# Patient Record
Sex: Female | Born: 1985 | Hispanic: Yes | Marital: Single | Smoking: Current some day smoker
Health system: Southern US, Community
[De-identification: ages and names within clinical notes are randomized; demographics above are authoritative.]

## PROBLEM LIST (undated history)

## (undated) DIAGNOSIS — F329 Major depressive disorder, single episode, unspecified: Secondary | ICD-10-CM

## (undated) DIAGNOSIS — D270 Benign neoplasm of right ovary: Secondary | ICD-10-CM

## (undated) DIAGNOSIS — F319 Bipolar disorder, unspecified: Secondary | ICD-10-CM

## (undated) DIAGNOSIS — F32A Depression, unspecified: Secondary | ICD-10-CM

## (undated) DIAGNOSIS — T7840XA Allergy, unspecified, initial encounter: Secondary | ICD-10-CM

## (undated) DIAGNOSIS — J45909 Unspecified asthma, uncomplicated: Secondary | ICD-10-CM

## (undated) DIAGNOSIS — K8020 Calculus of gallbladder without cholecystitis without obstruction: Secondary | ICD-10-CM

## (undated) DIAGNOSIS — F1910 Other psychoactive substance abuse, uncomplicated: Secondary | ICD-10-CM

## (undated) DIAGNOSIS — F419 Anxiety disorder, unspecified: Secondary | ICD-10-CM

## (undated) DIAGNOSIS — B009 Herpesviral infection, unspecified: Secondary | ICD-10-CM

## (undated) HISTORY — PX: TUBAL LIGATION: SHX77

## (undated) HISTORY — PX: OTHER SURGICAL HISTORY: SHX169

## (undated) HISTORY — DX: Other psychoactive substance abuse, uncomplicated: F19.10

## (undated) HISTORY — DX: Herpesviral infection, unspecified: B00.9

## (undated) HISTORY — DX: Allergy, unspecified, initial encounter: T78.40XA

## (undated) HISTORY — PX: DILATION AND CURETTAGE OF UTERUS: SHX78

## (undated) HISTORY — DX: Benign neoplasm of right ovary: D27.0

## (undated) HISTORY — DX: Calculus of gallbladder without cholecystitis without obstruction: K80.20

## (undated) HISTORY — DX: Unspecified asthma, uncomplicated: J45.909

## (undated) HISTORY — PX: MULTIPLE TOOTH EXTRACTIONS: SHX2053

---
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
## 2005-09-21 ENCOUNTER — Emergency Department (HOSPITAL_COMMUNITY): Admission: EM | Admit: 2005-09-21 | Discharge: 2005-09-21 | Payer: Self-pay | Admitting: Emergency Medicine

## 2006-12-14 ENCOUNTER — Emergency Department (HOSPITAL_COMMUNITY): Admission: EM | Admit: 2006-12-14 | Discharge: 2006-12-14 | Payer: Self-pay | Admitting: Family Medicine

## 2007-02-07 ENCOUNTER — Ambulatory Visit (HOSPITAL_COMMUNITY): Admission: RE | Admit: 2007-02-07 | Discharge: 2007-02-07 | Payer: Self-pay | Admitting: Obstetrics & Gynecology

## 2007-02-26 ENCOUNTER — Emergency Department (HOSPITAL_COMMUNITY): Admission: EM | Admit: 2007-02-26 | Discharge: 2007-02-26 | Payer: Self-pay | Admitting: Emergency Medicine

## 2007-03-07 ENCOUNTER — Ambulatory Visit (HOSPITAL_COMMUNITY): Admission: RE | Admit: 2007-03-07 | Discharge: 2007-03-07 | Payer: Self-pay | Admitting: Obstetrics & Gynecology

## 2012-10-22 ENCOUNTER — Emergency Department (HOSPITAL_COMMUNITY)
Admission: EM | Admit: 2012-10-22 | Discharge: 2012-10-23 | Disposition: A | Discharge status: Home / Self Care | Payer: Medicaid Other | Attending: Emergency Medicine | Admitting: Emergency Medicine

## 2012-10-22 ENCOUNTER — Encounter (HOSPITAL_COMMUNITY): Payer: Self-pay

## 2012-10-22 DIAGNOSIS — Z3202 Encounter for pregnancy test, result negative: Secondary | ICD-10-CM | POA: Insufficient documentation

## 2012-10-22 DIAGNOSIS — R45851 Suicidal ideations: Secondary | ICD-10-CM

## 2012-10-22 DIAGNOSIS — F39 Unspecified mood [affective] disorder: Secondary | ICD-10-CM | POA: Insufficient documentation

## 2012-10-22 DIAGNOSIS — IMO0002 Reserved for concepts with insufficient information to code with codable children: Secondary | ICD-10-CM | POA: Diagnosis present

## 2012-10-22 DIAGNOSIS — X789XXA Intentional self-harm by unspecified sharp object, initial encounter: Secondary | ICD-10-CM | POA: Insufficient documentation

## 2012-10-22 DIAGNOSIS — Z8719 Personal history of other diseases of the digestive system: Secondary | ICD-10-CM | POA: Insufficient documentation

## 2012-10-22 DIAGNOSIS — F439 Reaction to severe stress, unspecified: Secondary | ICD-10-CM | POA: Diagnosis present

## 2012-10-22 DIAGNOSIS — F17200 Nicotine dependence, unspecified, uncomplicated: Secondary | ICD-10-CM | POA: Insufficient documentation

## 2012-10-22 HISTORY — DX: Major depressive disorder, single episode, unspecified: F32.9

## 2012-10-22 HISTORY — DX: Depression, unspecified: F32.A

## 2012-10-22 HISTORY — DX: Anxiety disorder, unspecified: F41.9

## 2012-10-22 LAB — CBC
HCT: 37.6 % (ref 36.0–46.0)
Hemoglobin: 12.4 g/dL (ref 12.0–15.0)
MCH: 30.1 pg (ref 26.0–34.0)
MCHC: 33 g/dL (ref 30.0–36.0)
MCV: 91.3 fL (ref 78.0–100.0)
Platelets: 336 10*3/uL (ref 150–400)
RBC: 4.12 MIL/uL (ref 3.87–5.11)
RDW: 13.2 % (ref 11.5–15.5)
WBC: 6.3 10*3/uL (ref 4.0–10.5)

## 2012-10-22 LAB — RAPID URINE DRUG SCREEN, HOSP PERFORMED
Amphetamines: NOT DETECTED
Barbiturates: NOT DETECTED
Benzodiazepines: NOT DETECTED
Cocaine: POSITIVE — AB
Opiates: NOT DETECTED
Tetrahydrocannabinol: POSITIVE — AB

## 2012-10-22 LAB — COMPREHENSIVE METABOLIC PANEL
ALT: 10 U/L (ref 0–35)
AST: 14 U/L (ref 0–37)
Albumin: 3.6 g/dL (ref 3.5–5.2)
Alkaline Phosphatase: 79 U/L (ref 39–117)
BUN: 13 mg/dL (ref 6–23)
CO2: 25 mEq/L (ref 19–32)
Calcium: 9.5 mg/dL (ref 8.4–10.5)
Chloride: 105 mEq/L (ref 96–112)
Creatinine, Ser: 0.59 mg/dL (ref 0.50–1.10)
GFR calc Af Amer: >90 mL/min (ref >90)
GFR calc non Af Amer: >90 mL/min (ref >90)
Glucose, Bld: 84 mg/dL (ref 70–99)
Potassium: 3.7 mEq/L (ref 3.5–5.1)
Sodium: 137 mEq/L (ref 135–145)
Total Bilirubin: 0.2 mg/dL — ABNORMAL LOW (ref 0.3–1.2)
Total Protein: 7.7 g/dL (ref 6.0–8.3)

## 2012-10-22 LAB — ACETAMINOPHEN LEVEL: Acetaminophen (Tylenol), Serum: <15 ug/mL (ref 10–30)

## 2012-10-22 LAB — ETHANOL: Alcohol, Ethyl (B): <11 mg/dL (ref 0–11)

## 2012-10-22 LAB — POCT PREGNANCY, URINE: Preg Test, Ur: NEGATIVE

## 2012-10-22 LAB — SALICYLATE LEVEL: Salicylate Lvl: <2 mg/dL — ABNORMAL LOW (ref 2.8–20.0)

## 2012-10-22 MED ORDER — ACETAMINOPHEN 325 MG PO TABS
650.0000 mg | ORAL_TABLET | ORAL | Status: DC | PRN
Start: 2012-10-22 — End: 2012-10-23

## 2012-10-22 MED ORDER — ALUM & MAG HYDROXIDE-SIMETH 200-200-20 MG/5ML PO SUSP: 30.0000 mL | ORAL | Status: DC | PRN

## 2012-10-22 MED ORDER — ZOLPIDEM TARTRATE 5 MG PO TABS: 5.0000 mg | ORAL_TABLET | Freq: Every evening | ORAL | Status: DC | PRN

## 2012-10-22 MED ORDER — IBUPROFEN 600 MG PO TABS: 600.0000 mg | ORAL_TABLET | Freq: Three times a day (TID) | ORAL | Status: DC | PRN

## 2012-10-22 MED ORDER — ONDANSETRON HCL 4 MG PO TABS
4.0000 mg | ORAL_TABLET | Freq: Three times a day (TID) | ORAL | Status: DC | PRN
Start: 2012-10-22 — End: 2012-10-23

## 2012-10-22 MED ORDER — NICOTINE 21 MG/24HR TD PT24: 21.0000 mg | MEDICATED_PATCH | Freq: Every day | TRANSDERMAL | Status: DC

## 2012-10-22 MED ORDER — LORAZEPAM 1 MG PO TABS: 1.0000 mg | ORAL_TABLET | Freq: Three times a day (TID) | ORAL | Status: DC | PRN
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
## 2012-10-22 NOTE — ED Notes (Signed)
Consent form and sign for patient friend to get medical information.

## 2012-10-23 ENCOUNTER — Encounter (HOSPITAL_COMMUNITY): Payer: Self-pay | Admitting: *Deleted

## 2012-10-23 DIAGNOSIS — IMO0002 Reserved for concepts with insufficient information to code with codable children: Secondary | ICD-10-CM | POA: Diagnosis present

## 2012-10-23 DIAGNOSIS — T7431XA Adult psychological abuse, confirmed, initial encounter: Secondary | ICD-10-CM
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
## 2012-10-23 NOTE — ED Notes (Signed)
D/C instructions and resource information given. Ambulatory without difficulty. No complaints voiced. One book bag returned. Escorted by mental health tech to front of hospital.
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
## 2012-10-23 NOTE — Progress Notes (Signed)
Pt confirms pcp is edwin avbuere EPIC updated

## 2012-10-28 NOTE — ED Provider Notes (Signed)
Medical screening examination/treatment/procedure(s) were performed by non-physician practitioner and as supervising physician I was immediately available for consultation/collaboration.  Derwood Kaplan, MD 10/28/12 [phone]

## 2013-02-05 ENCOUNTER — Emergency Department (HOSPITAL_COMMUNITY)
Admission: EM | Admit: 2013-02-05 | Discharge: 2013-02-06 | Disposition: A | Discharge status: Home / Self Care | Payer: Medicaid Other | Attending: Emergency Medicine | Admitting: Emergency Medicine

## 2013-02-05 ENCOUNTER — Encounter (HOSPITAL_COMMUNITY): Payer: Self-pay | Admitting: Emergency Medicine

## 2013-02-05 DIAGNOSIS — T7491XA Unspecified adult maltreatment, confirmed, initial encounter: Secondary | ICD-10-CM | POA: Insufficient documentation

## 2013-02-05 DIAGNOSIS — IMO0002 Reserved for concepts with insufficient information to code with codable children: Secondary | ICD-10-CM

## 2013-02-05 DIAGNOSIS — F17200 Nicotine dependence, unspecified, uncomplicated: Secondary | ICD-10-CM | POA: Insufficient documentation

## 2013-02-05 DIAGNOSIS — Z8659 Personal history of other mental and behavioral disorders: Secondary | ICD-10-CM | POA: Insufficient documentation

## 2013-02-05 LAB — CBC
HCT: 38.9 % (ref 36.0–46.0)
Hemoglobin: 13.2 g/dL (ref 12.0–15.0)
MCH: 31.4 pg (ref 26.0–34.0)
MCHC: 33.9 g/dL (ref 30.0–36.0)
MCV: 92.4 fL (ref 78.0–100.0)
Platelets: 416 10*3/uL — ABNORMAL HIGH (ref 150–400)
RBC: 4.21 MIL/uL (ref 3.87–5.11)
RDW: 12.6 % (ref 11.5–15.5)
WBC: 7.1 10*3/uL (ref 4.0–10.5)

## 2013-02-05 LAB — COMPREHENSIVE METABOLIC PANEL
ALT: 13 U/L (ref 0–35)
AST: 19 U/L (ref 0–37)
Albumin: 3.6 g/dL (ref 3.5–5.2)
Alkaline Phosphatase: 83 U/L (ref 39–117)
BUN: 13 mg/dL (ref 6–23)
CO2: 24 mEq/L (ref 19–32)
Calcium: 9.3 mg/dL (ref 8.4–10.5)
Chloride: 103 mEq/L (ref 96–112)
Creatinine, Ser: 0.76 mg/dL (ref 0.50–1.10)
GFR calc Af Amer: >90 mL/min (ref >90)
GFR calc non Af Amer: >90 mL/min (ref >90)
Glucose, Bld: 95 mg/dL (ref 70–99)
Potassium: 4 mEq/L (ref 3.5–5.1)
Sodium: 137 mEq/L (ref 135–145)
Total Bilirubin: 0.2 mg/dL — ABNORMAL LOW (ref 0.3–1.2)
Total Protein: 7.4 g/dL (ref 6.0–8.3)

## 2013-02-05 LAB — SALICYLATE LEVEL: Salicylate Lvl: <2 mg/dL — ABNORMAL LOW (ref 2.8–20.0)

## 2013-02-05 LAB — ETHANOL: Alcohol, Ethyl (B): <11 mg/dL (ref 0–11)

## 2013-02-05 LAB — ACETAMINOPHEN LEVEL: Acetaminophen (Tylenol), Serum: <15 ug/mL (ref 10–30)
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
## 2013-02-06 NOTE — ED Provider Notes (Signed)
  Medical screening examination/treatment/procedure(s) were performed by non-physician practitioner and as supervising physician I was immediately available for consultation/collaboration.    Gerhard Munch, MD 02/06/13 [phone]

## 2013-03-28 ENCOUNTER — Emergency Department (HOSPITAL_COMMUNITY)
Admission: EM | Admit: 2013-03-28 | Discharge: 2013-03-28 | Disposition: A | Discharge status: Home / Self Care | Payer: Medicaid Other | Source: Home / Self Care | Attending: Emergency Medicine | Admitting: Emergency Medicine

## 2013-03-28 ENCOUNTER — Encounter (HOSPITAL_COMMUNITY): Payer: Self-pay | Admitting: Emergency Medicine

## 2013-03-28 DIAGNOSIS — K047 Periapical abscess without sinus: Secondary | ICD-10-CM

## 2013-03-28 MED ORDER — AMOXICILLIN-POT CLAVULANATE 875-125 MG PO TABS: 1.0000 | ORAL_TABLET | Freq: Two times a day (BID) | ORAL | Status: DC

## 2013-03-28 MED ORDER — IBUPROFEN 800 MG PO TABS: 800.0000 mg | ORAL_TABLET | Freq: Three times a day (TID) | ORAL | Status: DC | PRN

## 2013-03-28 NOTE — ED Provider Notes (Signed)
Medical screening examination/treatment/procedure(s) were performed by non-physician practitioner and as supervising physician I was immediately available for consultation/collaboration.  Leslee Home, M.D.  Reuben Likes, MD 03/28/13 2033

## 2013-03-28 NOTE — ED Notes (Signed)
Dental pain and swelling onset Wednesday.  Visible swelling and redness to right side of face.  Patient reports several issues with teeth on upper right, does have a broken tooth

## 2013-03-28 NOTE — ED Provider Notes (Signed)
CSN: [id number]     Arrival date & time 03/28/13  1449 History   First MD Initiated Contact with Patient 03/28/13 1600     Chief Complaint  Patient presents with  . Dental Pain   (Consider location/radiation/quality/duration/timing/severity/associated sxs/prior Treatment) HPI Comments: 27 year old female presents complaining of dental infection and facial swelling. She has painful teeth, swelling, tenderness, and redness of her posterior right upper jaw. She has bad teeth in that area and she has had an infection there before. She started taking amoxicillin that a friend had 2 days ago and since then the pain has gotten better but the swelling has actually gotten worse. She does not know what dose she is taking, only that she is taking it twice a day and has taken 4 doses total. She denies fever, chills, tongue swelling, throat swelling, trouble breathing, recent travel, sick contacts. She does not have a dentist at the current time.  Patient is a 27 y.o. female presenting with tooth pain.  Dental Pain Associated symptoms: facial swelling   Associated symptoms: no fever     Past Medical History  Diagnosis Date  . Depression   . Anxiety    Past Surgical History  Procedure Laterality Date  . Gall stones     No family history on file. History  Substance Use Topics  . Smoking status: Current Some Day Smoker  . Smokeless tobacco: Not on file  . Alcohol Use: Yes     Comment: social    OB History   Grav Para Term Preterm Abortions TAB SAB Ect Mult Living                 Review of Systems  Constitutional: Negative for fever and chills.  HENT: Positive for dental problem and facial swelling.   Eyes: Negative for visual disturbance.  Respiratory: Negative for cough and shortness of breath.   Cardiovascular: Negative for chest pain, palpitations and leg swelling.  Gastrointestinal: Negative for nausea, vomiting and abdominal pain.  Endocrine: Negative for polydipsia and polyuria.    Genitourinary: Negative for dysuria, urgency and frequency.  Musculoskeletal: Negative for arthralgias and myalgias.  Skin: Negative for rash.  Neurological: Negative for dizziness, weakness and light-headedness.    Allergies  Review of patient's allergies indicates no known allergies.  Home Medications   Current Outpatient Rx  Name  Route  Sig  Dispense  Refill  . AMOXICILLIN PO   Oral   Take by mouth. Reports this is a friends antibiotic: amoxicillin ?mg took one Wednesday night, 2 doses yesterday and one dose this am         . acetaminophen (TYLENOL) 325 MG tablet   Oral   Take 650 mg by mouth every 6 (six) hours as needed for pain (pain).         Marland Kitchen amoxicillin-clavulanate (AUGMENTIN) 875-125 MG per tablet   Oral   Take 1 tablet by mouth every 12 (twelve) hours.   20 tablet   0   . ibuprofen (ADVIL,MOTRIN) 800 MG tablet   Oral   Take 1 tablet (800 mg total) by mouth every 8 (eight) hours as needed.   30 tablet   0    BP 110/78  Pulse 86  Temp(Src) 99.9 F (37.7 C) (Oral)  Resp 18  SpO2 100%  LMP 03/24/2013 Physical Exam  Nursing note and vitals reviewed. Constitutional: She is oriented to person, place, and time. Vital signs are normal. She appears well-developed and well-nourished. No distress.  HENT:  Head: Normocephalic and atraumatic.    Mouth/Throat: Abnormal dentition. Dental abscesses (right upper posterior 3 molars  ) and dental caries present.  Pulmonary/Chest: Effort normal. No respiratory distress.  Neurological: She is alert and oriented to person, place, and time. She has normal strength. Coordination normal.  Skin: Skin is warm and dry. No rash noted. She is not diaphoretic.  Psychiatric: She has a normal mood and affect. Judgment normal.    ED Course  Procedures (including critical care time) Labs Review Labs Reviewed - No data to display Imaging Review No results found.    MDM   1. Dental abscess    Change amoxicillin to  Augmentin. Take ibuprofen as needed. Followup if not improving. Referred to dentist  Meds ordered this encounter  Medications  . AMOXICILLIN PO    Sig: Take by mouth. Reports this is a friends antibiotic: amoxicillin ?mg took one Wednesday night, 2 doses yesterday and one dose this am  . amoxicillin-clavulanate (AUGMENTIN) 875-125 MG per tablet    Sig: Take 1 tablet by mouth every 12 (twelve) hours.    Dispense:  20 tablet    Refill:  0    Order Specific Question:  Supervising Provider    Answer:  Lorenz Coaster, DAVID C [id number]  . ibuprofen (ADVIL,MOTRIN) 800 MG tablet    Sig: Take 1 tablet (800 mg total) by mouth every 8 (eight) hours as needed.    Dispense:  30 tablet    Refill:  0    Order Specific Question:  Supervising Provider    Answer:  Lorenz Coaster, DAVID C [[id number]]       Graylon Good, PA-C 03/28/13 1651

## 2013-09-16 ENCOUNTER — Encounter (HOSPITAL_COMMUNITY): Payer: Self-pay | Admitting: Emergency Medicine

## 2013-09-16 ENCOUNTER — Emergency Department (HOSPITAL_COMMUNITY)
Admission: EM | Admit: 2013-09-16 | Discharge: 2013-09-16 | Disposition: A | Discharge status: Home / Self Care | Payer: Medicaid Other | Attending: Emergency Medicine | Admitting: Emergency Medicine

## 2013-09-16 ENCOUNTER — Emergency Department (HOSPITAL_COMMUNITY): Payer: Medicaid Other

## 2013-09-16 DIAGNOSIS — Z8719 Personal history of other diseases of the digestive system: Secondary | ICD-10-CM | POA: Insufficient documentation

## 2013-09-16 DIAGNOSIS — O9989 Other specified diseases and conditions complicating pregnancy, childbirth and the puerperium: Secondary | ICD-10-CM | POA: Insufficient documentation

## 2013-09-16 DIAGNOSIS — D270 Benign neoplasm of right ovary: Secondary | ICD-10-CM

## 2013-09-16 DIAGNOSIS — Z3490 Encounter for supervision of normal pregnancy, unspecified, unspecified trimester: Secondary | ICD-10-CM

## 2013-09-16 DIAGNOSIS — Z8659 Personal history of other mental and behavioral disorders: Secondary | ICD-10-CM | POA: Insufficient documentation

## 2013-09-16 DIAGNOSIS — R109 Unspecified abdominal pain: Secondary | ICD-10-CM | POA: Insufficient documentation

## 2013-09-16 DIAGNOSIS — O99330 Smoking (tobacco) complicating pregnancy, unspecified trimester: Secondary | ICD-10-CM | POA: Insufficient documentation

## 2013-09-16 DIAGNOSIS — D279 Benign neoplasm of unspecified ovary: Secondary | ICD-10-CM | POA: Insufficient documentation

## 2013-09-16 LAB — WET PREP, GENITAL
Clue Cells Wet Prep HPF POC: NONE SEEN
Trich, Wet Prep: NONE SEEN
WBC, Wet Prep HPF POC: NONE SEEN
Yeast Wet Prep HPF POC: NONE SEEN

## 2013-09-16 LAB — CBC
HCT: 33.2 % — ABNORMAL LOW (ref 36.0–46.0)
Hemoglobin: 11.4 g/dL — ABNORMAL LOW (ref 12.0–15.0)
MCH: 31.6 pg (ref 26.0–34.0)
MCHC: 34.3 g/dL (ref 30.0–36.0)
MCV: 92 fL (ref 78.0–100.0)
Platelets: 290 10*3/uL (ref 150–400)
RBC: 3.61 MIL/uL — ABNORMAL LOW (ref 3.87–5.11)
RDW: 12.7 % (ref 11.5–15.5)
WBC: 6.5 10*3/uL (ref 4.0–10.5)

## 2013-09-16 LAB — ABO/RH: ABO/RH(D): A POS

## 2013-09-16 LAB — URINALYSIS, ROUTINE W REFLEX MICROSCOPIC
Bilirubin Urine: NEGATIVE
Glucose, UA: NEGATIVE mg/dL
Hgb urine dipstick: NEGATIVE
Ketones, ur: NEGATIVE mg/dL
Leukocytes, UA: NEGATIVE
Nitrite: NEGATIVE
Protein, ur: NEGATIVE mg/dL
Specific Gravity, Urine: 1.021 (ref 1.005–1.030)
Urobilinogen, UA: 0.2 mg/dL (ref 0.0–1.0)
pH: 6.5 (ref 5.0–8.0)

## 2013-09-16 LAB — HCG, QUANTITATIVE, PREGNANCY: hCG, Beta Chain, Quant, S: 41169 m[IU]/mL — ABNORMAL HIGH (ref <5)

## 2013-09-16 LAB — POC URINE PREG, ED: Preg Test, Ur: POSITIVE — AB

## 2013-09-16 MED ORDER — PRENATAL COMPLETE 14-0.4 MG PO TABS: 1.0000 | ORAL_TABLET | Freq: Every day | ORAL | Status: DC

## 2013-09-16 NOTE — ED Notes (Addendum)
Pt reports lower abd pains x 2 weeks. Denies any n/v/d, urinary or vaginal symptoms. Unsure of last menstrual cycle, pt thinks she may be pregnant and has hx of miscarriage.

## 2013-09-16 NOTE — ED Provider Notes (Signed)
CSN: [id number]     Arrival date & time 09/16/13  1412 History   First MD Initiated Contact with Patient 09/16/13 1505     Chief Complaint  Patient presents with  . Abdominal Pain      HPI Pt was seen at 1510. Per pt, c/o gradual onset and persistence of multiple intermittent episodes of right lower pelvic "pain" for the past 2 weeks. Pt describes the pain as "sharp" and "cramping," lasting only seconds each episode. Pt has hx G5P3, LMP "about the beginning of April." She is unsure if she is currently pregnant. Denies dysuria/hematuria, no vaginal bleeding/discharge, no back pain, no N/V/D, no fevers.    Past Medical History  Diagnosis Date  . Depression   . Anxiety    Past Surgical History  Procedure Laterality Date  . Gall stones      History  Substance Use Topics  . Smoking status: Current Some Day Smoker  . Smokeless tobacco: Not on file  . Alcohol Use: Yes     Comment: social     Review of Systems ROS: Statement: All systems negative except as marked or noted in the HPI; Constitutional: Negative for fever and chills. ; ; Eyes: Negative for eye pain, redness and discharge. ; ; ENMT: Negative for ear pain, hoarseness, nasal congestion, sinus pressure and sore throat. ; ; Cardiovascular: Negative for chest pain, palpitations, diaphoresis, dyspnea and peripheral edema. ; ; Respiratory: Negative for cough, wheezing and stridor. ; ; Gastrointestinal: Negative for nausea, vomiting, diarrhea, abdominal pain, blood in stool, hematemesis, jaundice and rectal bleeding. . ; ; Genitourinary: Negative for dysuria, flank pain and hematuria. ; ; GYN:  +pelvic pain. No vaginal bleeding, no vaginal discharge, no vulvar pain.;; Musculoskeletal: Negative for back pain and neck pain. Negative for swelling and trauma.; ; Skin: Negative for pruritus, rash, abrasions, blisters, bruising and skin lesion.; ; Neuro: Negative for headache, lightheadedness and neck stiffness. Negative for weakness, altered  level of consciousness , altered mental status, extremity weakness, paresthesias, involuntary movement, seizure and syncope.     Allergies  Review of patient's allergies indicates no known allergies.  Home Medications   Prior to Admission medications   Medication Sig Start Date End Date Taking? Authorizing Provider  acetaminophen (TYLENOL) 325 MG tablet Take 650 mg by mouth every 6 (six) hours as needed for pain (pain).    Historical Provider, MD  AMOXICILLIN PO Take by mouth. Reports this is a friends antibiotic: amoxicillin ?mg took one Wednesday night, 2 doses yesterday and one dose this am    Historical Provider, MD  amoxicillin-clavulanate (AUGMENTIN) 875-125 MG per tablet Take 1 tablet by mouth every 12 (twelve) hours. 03/28/13   Freeman Caldron Baker, PA-C  ibuprofen (ADVIL,MOTRIN) 800 MG tablet Take 1 tablet (800 mg total) by mouth every 8 (eight) hours as needed. 03/28/13   Liam Graham, PA-C   BP 112/64  Pulse 87  Temp(Src) 98.3 F (36.8 C) (Oral)  Resp 18  Ht 5\' 5"  (1.651 m)  Wt 189 lb (85.73 kg)  BMI 31.45 kg/m2  SpO2 99%  LMP 07/30/2013 Physical Exam 1515: Physical examination:  Nursing notes reviewed; Vital signs and O2 SAT reviewed;  Constitutional: Well developed, Well nourished, Well hydrated, In no acute distress; Head:  Normocephalic, atraumatic; Eyes: EOMI, PERRL, No scleral icterus; ENMT: Mouth and pharynx normal, Mucous membranes moist; Neck: Supple, Full range of motion, No lymphadenopathy; Cardiovascular: Regular rate and rhythm, No murmur, rub, or gallop; Respiratory: Breath sounds clear & equal  bilaterally, No rales, rhonchi, wheezes.  Speaking full sentences with ease, Normal respiratory effort/excursion; Chest: Nontender, Movement normal; Abdomen: Soft, +mild right pelvic and suprapubic tenderness to palp. No rebound or guarding. Nondistended, Normal bowel sounds; Genitourinary: No CVA tenderness. Pelvic exam performed with permission of pt and female ED tech  assist during exam.  External genitalia w/o lesions. Vaginal vault with thick white discharge, no bleeding.  Cervix w/o lesions, os closed, no bleeding, not friable, GC/chlam and wet prep obtained and sent to lab.  Bimanual exam w/o CMT, uterine or adnexal tenderness.; Extremities: Pulses normal, No tenderness, No edema, No calf edema or asymmetry.; Neuro: AA&Ox3, Major CN grossly intact.  Speech clear. No gross focal motor or sensory deficits in extremities.; Skin: Color normal, Warm, Dry.   ED Course  Procedures     EKG Interpretation None      MDM  MDM Reviewed: previous chart, nursing note and vitals Reviewed previous: labs Interpretation: labs and ultrasound    Results for orders placed during the hospital encounter of 09/16/13  WET PREP, GENITAL      Result Value Ref Range   Yeast Wet Prep HPF POC NONE SEEN  NONE SEEN   Trich, Wet Prep NONE SEEN  NONE SEEN   Clue Cells Wet Prep HPF POC NONE SEEN  NONE SEEN   WBC, Wet Prep HPF POC NONE SEEN  NONE SEEN  URINALYSIS, ROUTINE W REFLEX MICROSCOPIC      Result Value Ref Range   Color, Urine YELLOW  YELLOW   APPearance CLEAR  CLEAR   Specific Gravity, Urine 1.021  1.005 - 1.030   pH 6.5  5.0 - 8.0   Glucose, UA NEGATIVE  NEGATIVE mg/dL   Hgb urine dipstick NEGATIVE  NEGATIVE   Bilirubin Urine NEGATIVE  NEGATIVE   Ketones, ur NEGATIVE  NEGATIVE mg/dL   Protein, ur NEGATIVE  NEGATIVE mg/dL   Urobilinogen, UA 0.2  0.0 - 1.0 mg/dL   Nitrite NEGATIVE  NEGATIVE   Leukocytes, UA NEGATIVE  NEGATIVE  HCG, QUANTITATIVE, PREGNANCY      Result Value Ref Range   hCG, Beta Chain, Quant, S 41169 (*) <5 mIU/mL  CBC      Result Value Ref Range   WBC 6.5  4.0 - 10.5 K/uL   RBC 3.61 (*) 3.87 - 5.11 MIL/uL   Hemoglobin 11.4 (*) 12.0 - 15.0 g/dL   HCT 33.2 (*) 36.0 - 46.0 %   MCV 92.0  78.0 - 100.0 fL   MCH 31.6  26.0 - 34.0 pg   MCHC 34.3  30.0 - 36.0 g/dL   RDW 12.7  11.5 - 15.5 %   Platelets 290  150 - 400 K/uL  POC URINE PREG, ED       Result Value Ref Range   Preg Test, Ur POSITIVE (*) NEGATIVE  ABO/RH      Result Value Ref Range   ABO/RH(D) A POS     No rh immune globuloin NOT A RH IMMUNE GLOBULIN CANDIDATE, PT RH POSITIVE     US Pelvis Complete 09/16/2013   CLINICAL DATA:  Right lower quadrant pain and only pregnancy. By LMP the patient is 6 weeks 5 days. EDC by LMP is 05/07/2014.  EXAM: TWIN OBSTETRIC <14WK [place] AND TRANSVAGINAL OB US  COMPARISON:  02/07/2007  FINDINGS: TWIN 1  Intrauterine gestational sac: Visualized/normal in shape. A thick separating membrane is identified, consistent with diamniotic dichorionic gestation  Yolk sac:  Present  Embryo:  Not seen  Cardiac Activity: Not seen  MSD: 13.6  mm   6 w   2  d  TWIN 2  Intrauterine gestational sac: Present  Yolk sac:  Present  Embryo:  Not seen  Cardiac Activity: Not seen  MSD: 17.1  mm   6 w   4  d  Maternal uterus/adnexae: Within the right adnexa there is an echogenic mass measuring 5.0 x 3.4 x 5.0 cm. There is color flow surrounding the mass but no vascularity within the mass. Trace free pelvic fluid identified.  IMPRESSION: 1. Diamniotic dichorionic twin gestation. 2. Crown-rump length not yet measurable. Follow-up ultrasound is recommended in 14 days to document presence of fetal pole and for dating purposes. 3. 5.0 cm echogenic mass within the right adnexa, consistent with dermoid. No color Doppler evidence for torsion.   Electronically Signed   By: Shon Hale M.D.   On: 09/16/2013 16:52    1815:  Workup reassuring. Will need f/u with OB/GYN for pregnancy and right dermoid cyst. Pt wants to go home now. Dx and testing d/w pt and family.  Questions answered.  Verb understanding, agreeable to d/c home with outpt f/u.   Alfonzo Feller, DO 09/19/13 2352

## 2013-09-16 NOTE — ED Notes (Addendum)
When pt is in pain location on RLQ of abdomen; pain is sharp and stabbing and duration of pain is approximately one minute and then goes away; pt has had this issue for past two weeks

## 2013-09-16 NOTE — ED Notes (Signed)
Pt in ultrasound

## 2013-09-16 NOTE — Discharge Instructions (Signed)
°Emergency Department Resource Guide °1) Find a Doctor and Pay Out of Pocket °Although you won't have to find out who is covered by your insurance plan, it is a good idea to ask around and get recommendations. You will then need to call the office and see if the doctor you have chosen will accept you as a new patient and what types of options they offer for patients who are self-pay. Some doctors offer discounts or will set up payment plans for their patients who do not have insurance, but you will need to ask so you aren't surprised when you get to your appointment. ° °2) Contact Your Local Health Department °Not all health departments have doctors that can see patients for sick visits, but many do, so it is worth a call to see if yours does. If you don't know where your local health department is, you can check in your phone book. The CDC also has a tool to help you locate your state's health department, and many state websites also have listings of all of their local health departments. ° °3) Find a Walk-in Clinic °If your illness is not likely to be very severe or complicated, you may want to try a walk in clinic. These are popping up all over the country in pharmacies, drugstores, and shopping centers. They're usually staffed by nurse practitioners or physician assistants that have been trained to treat common illnesses and complaints. They're usually fairly quick and inexpensive. However, if you have serious medical issues or chronic medical problems, these are probably not your best option. ° °No Primary Care Doctor: °- Call [facility] at  832-8000 - they can help you locate a primary care doctor that  accepts your insurance, provides certain services, etc. °- Physician Referral Service- 1-800-533-3463 ° °Chronic Pain Problems: °Organization         Address  Phone   Notes  °[facility]  (336) 297-2271 Patients need to be referred by their primary care doctor.  ° °Medication  Assistance: °Organization         Address  Phone   Notes  °[place] [facility] [address] °[place], [place] [address] (336) 641-8030 --Must be a resident of [place] °-- Must have NO insurance coverage whatsoever (no Medicaid/ Medicare, etc.) °-- The pt. MUST have a primary care doctor that directs their care regularly and follows them in the community °  °[facility]  (866) 331-1348   °[employer]  (888) 892-1162   ° °Agencies that provide inexpensive medical care: °Organization         Address  Phone   Notes  °[facility]  (336) 832-8035   °[facility]    (336) 832-7272   °[facility] [address] °[place], [place] [address] (336) 832-4777   °[facility] [address], °[place] (336) 271-4999   °[facility]    (336) 373-0678   °[facility]    (336) 272-1050   °[facility] ° [address], [place] Phone:  (336) 832-4444, Fax:  (336) 832-4440 Hours of Operation:  9 am - 6 pm, M-F.  Also accepts Medicaid/Medicare and self-pay.  °[facility] ° [address],  Phone: (336) 832-3150, Fax: (336) 832-3151. Hours of Operation:  8:30 am - 5:30 pm, M-F.  Also accepts Medicaid and self-pay.  °[facility] [place] [address], [place] Phone: (336) 878-6027   °[facility] [address], [place], [place] (336)723-1848, Ext. 123 Mondays & Thursdays: 7-9 AM.  First 15 patients are seen on a first come, first serve basis. °  ° °Medicaid-accepting [place] Providers: ° °Organization         Address  Phone   Notes  °[facility] [address], [place] (336) 641-2100 Also accepts self-pay patients.  °[facility] [address], [place] ° (336) 856-9996   °[facility] [address], [place]  (336) 288-8857   °[facility] [address] [place] [address], [place] (336) 299-7000   °Veita Bland [address], [place]  ° (336) 373-1557 Only accepts [place] Access Medicaid patients after they have their name applied to their card.  ° °Self-Pay (no insurance) in [place]: ° °Organization         Address  Phone   Notes  °Sickle Cell Patients, [facility] [address], [place] (336) 832-1970   °[facility] [address], [place] (336) 832-4400   °[facility] ° [address],  (336) 992-4800   °[facility]/Dr. Osei-Bonsu ° [address] [place] [address], [place] or [address], [place] (336) 841-8500 Phone number for both [place] and [place] locations is the same.  °[facility] [address], [place] (336) 299-0000   °[facility] [address] [place] [address], [place] or [address] (336) 852-7530 °(336) 878-2260   °[facility] [address], [place] (336) 350-1642, phone; (336) 294-5005, fax Sees patients 1st and 3rd Saturday of every month.  Must not qualify for public or private insurance (i.e. Medicaid, Medicare, [place] Health Choice, Veterans' Benefits) • Household income should be no more than 200% of the poverty level •The clinic cannot treat you if you are pregnant or think you are pregnant • Sexually transmitted diseases are not treated at the clinic.  ° ° °Dental Care: °Organization         Address  Phone  Notes  °[place] [facility] [address], [place] (336) 641-6152 Accepts children up to age 21 who are enrolled in Medicaid or [place] Health Choice; pregnant women with a Medicaid card; and children who have applied for Medicaid or [place] Health Choice, but were declined, whose parents can pay a reduced fee at time of service.  °[place]  [facility] [place]  [address], [place] (336) 641-7733 Accepts children up to age 21 who are enrolled in Medicaid or [place] Health Choice; pregnant women with a Medicaid card; and children who have applied for Medicaid or [place] Health Choice, but were declined, whose parents can pay a reduced fee at time of service.  °[facility] ° [address], [place] (336) 641-4533 Patients are seen by appointment only. Walk-ins are not accepted. [facility] will see patients 18 years of age and older. °Monday - Tuesday (8am-5pm) °Most Wednesdays (8:30-5pm) °$30 per visit, cash only  °[facility] ° [address], [place] (336) 641-4533 Patients are seen by appointment only. Walk-ins are not accepted. [facility] will see patients 18 years of age and older. °One   Wednesday Evening (Monthly: Volunteer Based).  $30 per visit, cash only  °[facility]  (919) 537-3737 for adults; Children under age 4, call Graduate Pediatric Dentistry at ([phone]) [phone]. Children aged 4-14, please call (919) [phone] to request a pediatric application. ° Dental services are provided in all areas of dental care including fillings, crowns and bridges, complete and partial dentures, implants, gum treatment, root canals, and extractions. Preventive care is also provided. Treatment is provided to both adults and children. °Patients are selected via a lottery and there is often a waiting list. °  °[facility] [address], °[place] ° (336) 763-8833 [email or web address] °  °[facility] [address], [place], [place] (336)723-1848, Ext. 123 Second and Fourth Thursday of each month, opens at 6:30 AM; Clinic ends at 9 AM.  Patients are seen on a first-come first-served basis, and a limited number are seen during each clinic.  ° °[facility] ° [address], [place], [place] (336) 723-7904    Eligibility Requirements °You must have lived in [place], [place], or [place] counties for at least the last three months. °  You cannot be eligible for state or federal sponsored healthcare insurance, including Veterans Administration, Medicaid, or Medicare. °  You generally cannot be eligible for healthcare insurance through your employer.  °  How to apply: °Eligibility screenings are held every Tuesday and Wednesday afternoon from 1:00 pm until 4:00 pm. You do not need an appointment for the interview!  °[facility] [address], [place], [place] 336-631-2330   °[facility]  336-342-8273   °[facility]  336-703-3100   °[facility]  336-570-6415   ° °Behavioral Health Resources in the Community: °Intensive Outpatient Programs °Organization         Address  Phone  Notes  °[facility] [address], [place], [place] 336-878-6098   °[facility] [address], [place], [place] 336-832-9800   °[facility] [address] [place], [place] ° 336-882-2125   °[facility] [address],  °[place], [place] 1-800-853-5163 or 336-641-4981   °Substance Abuse Resources °Organization         Address  Phone  Notes  °[facility]  336-882-2125   °[facility]  336-784-9470   °[facility]  336-285-9073   °[facility]  336-845-3988   °Residential & Outpatient Substance Abuse Program  1-800-659-3381   °Psychological Services °Organization         Address  Phone  Notes  °[facility]  336- 832-9600   °[facility]  336- 378-7881   °[facility] [address], [place] 1-800-853-5163 or 336-641-4981   ° °Mobile Crisis Teams °Organization         Address  Phone  Notes  °[facility], Mobile Crisis Care Unit  1-877-626-1772   °Assertive °[facility] ° [address]  [place], [place] 336-834-9664   °Sharon DeEsch [address] °[place]  336-554-5454   ° °Self-Help/Support Groups °Organization         Address  Phone             Notes  °[facility]  - variety of support groups  336- 373-1402 Call for more information  °Narcotics Anonymous (NA), [facility] [address], °[place]   2 meetings at this location  ° °  Residential Treatment Programs Organization         Address  Phone  Notes  [facility] [address]    [place]  1-443-289-4857   [facility]  [address] [place] [address], [place], [place]   [place], [place] 678-535-9225 Admissions: 8am-3pm M-F  [facility] [place] [address]    [place], [place] 469-629-5284   [facility] [address] [place], [place], [place]   [facility] [address],  [place], [place]   [facility] Intensive Outpatient [address], [place], [place]   [facility] [place]  [place], [place] 1-(210) 097-3605 or 734-414-7006   [facility] [address], [place], [place] Accepts Medicaid  [facility] [address],  [place] 1-606-063-2170 Substance Abuse/Addiction Treatment   [facility]         Address  Phone  Notes  [facility]  2162082430   Domenic Schwab, PhD [address] [place], [place]   (514) 224-1349 or 802-343-1690   [facility] [place], [place] (240) 654-8930   [facility] [address], [place], [place] (712)274-8712 Insurance/Medicaid/sponsorship through [facility] [address]                                    [place], [place] [phone] [place] [address], [place] [phone]    Dr. Adele Schilder  ([phone]) [phone]   [facility] Dept. 1) [address], [place] 2) [place] 3)  [address], [place] ([phone]  (267)[phone]   [place] (773)781-7709 or [phone] (After Hours)       Take the prescription as directed. Your ultrasound today showed: "Diamniotic dichorionic twin gestation, approximately 47 weeks 59 days old. Crown-rump length not yet measurable. Follow-up ultrasound is recommended in 14 days to document presence of fetal pole and for dating purposes. 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  Call your regular OB/GYN doctor tomorrow morning to schedule a follow up appointment in the next 3 days.  Return to the Emergency Department immediately if worsening.

## 2013-09-17 LAB — GC/CHLAMYDIA PROBE AMP
CT Probe RNA: NEGATIVE
GC Probe RNA: NEGATIVE

## 2013-10-21 ENCOUNTER — Encounter: Payer: Self-pay | Admitting: Obstetrics & Gynecology

## 2013-10-21 ENCOUNTER — Other Ambulatory Visit (HOSPITAL_COMMUNITY)
Admission: RE | Admit: 2013-10-21 | Discharge: 2013-10-21 | Disposition: A | Discharge status: Home / Self Care | Payer: Medicaid Other | Source: Ambulatory Visit | Attending: Obstetrics & Gynecology | Admitting: Obstetrics & Gynecology

## 2013-10-21 ENCOUNTER — Ambulatory Visit (INDEPENDENT_AMBULATORY_CARE_PROVIDER_SITE_OTHER): Payer: Medicaid Other | Admitting: Obstetrics & Gynecology

## 2013-10-21 VITALS — BP 102/62 | HR 73 | Temp 98.2°F | Wt 184.8 lb

## 2013-10-21 DIAGNOSIS — Z3481 Encounter for supervision of other normal pregnancy, first trimester: Secondary | ICD-10-CM

## 2013-10-21 DIAGNOSIS — IMO0002 Reserved for concepts with insufficient information to code with codable children: Secondary | ICD-10-CM | POA: Insufficient documentation

## 2013-10-21 DIAGNOSIS — O30009 Twin pregnancy, unspecified number of placenta and unspecified number of amniotic sacs, unspecified trimester: Secondary | ICD-10-CM

## 2013-10-21 DIAGNOSIS — Z3480 Encounter for supervision of other normal pregnancy, unspecified trimester: Secondary | ICD-10-CM

## 2013-10-21 DIAGNOSIS — O30001 Twin pregnancy, unspecified number of placenta and unspecified number of amniotic sacs, first trimester: Secondary | ICD-10-CM

## 2013-10-21 DIAGNOSIS — O99330 Smoking (tobacco) complicating pregnancy, unspecified trimester: Secondary | ICD-10-CM

## 2013-10-21 DIAGNOSIS — O99331 Smoking (tobacco) complicating pregnancy, first trimester: Secondary | ICD-10-CM

## 2013-10-21 DIAGNOSIS — Z113 Encounter for screening for infections with a predominantly sexual mode of transmission: Secondary | ICD-10-CM | POA: Insufficient documentation

## 2013-10-21 DIAGNOSIS — Z01419 Encounter for gynecological examination (general) (routine) without abnormal findings: Secondary | ICD-10-CM | POA: Insufficient documentation

## 2013-10-21 LAB — POCT URINALYSIS DIP (DEVICE)
Bilirubin Urine: NEGATIVE
Glucose, UA: NEGATIVE mg/dL
Hgb urine dipstick: NEGATIVE
Ketones, ur: NEGATIVE mg/dL
Leukocytes, UA: NEGATIVE
Nitrite: NEGATIVE
Protein, ur: NEGATIVE mg/dL
Specific Gravity, Urine: 1.02 (ref 1.005–1.030)
Urobilinogen, UA: 0.2 mg/dL (ref 0.0–1.0)
pH: 5.5 (ref 5.0–8.0)

## 2013-10-21 MED ORDER — PRENATAL COMPLETE 14-0.4 MG PO TABS: 1.0000 | ORAL_TABLET | Freq: Every day | ORAL | Status: DC

## 2013-10-21 NOTE — Progress Notes (Signed)
Subjective:    Jennifer Cortez is being seen today for her first obstetrical visit.  This is not a planned pregnancy. She is at [redacted]w[redacted]d gestation. Her obstetrical history is significant for smoker. Relationship with FOB: significant other, living together. Patient does not (CONSIDERING) intend to breast feed. Pregnancy history fully reviewed.  Menstrual History: OB History   Grav Para Term Preterm Abortions TAB SAB Ect Mult Living   7 3 2 1 3 2 1  0 0 3       Patient's last menstrual period was 07/30/2013.    The following portions of the patient's history were reviewed and updated as appropriate: allergies, current medications, past family history, past medical history, past social history, past surgical history and problem list.  Review of Systems A comprehensive review of systems was negative.    Objective:    BP 102/62  Pulse 73  Temp(Src) 98.2 F (36.8 C)  Wt 184 lb 12.8 oz (83.825 kg)  LMP 07/30/2013  General Appearance:    Alert, cooperative, no distress, appears stated age                 Neck:   Supple, symmetrical, trachea midline, no adenopathy;    thyroid:  no enlargement/tenderness/nodules; no carotid   bruit or JVD  Back:     Symmetric, no curvature, ROM normal, no CVA tenderness  Lungs:     Clear to auscultation bilaterally, respirations unlabored  Chest Wall:    No tenderness or deformity   Heart:    Regular rate and rhythm, S1 and S2 normal, no murmur, rub   or gallop  Breast Exam:  deferred  Abdomen:     Soft, non-tender, bowel sounds active all four quadrants,    no masses, no organomegaly  Genitalia:    Normal female without lesion, discharge or tenderness; uterus 15 weeks sized     Extremities:   Extremities normal, atraumatic, no cyanosis or edema  Pulses:   2+ and symmetric all extremities  Skin:   Skin color, texture, turgor normal, no rashes or lesions; tatoos x2            Assessment:    Pregnancy at 11 and 2/7 weeks  Di/Di twin IUP     Plan:    Initial labs drawn. Prenatal vitamins. Problem list reviewed and updated. AFP3 discussed: requested. Role of ultrasound in pregnancy discussed; fetal survey: requested. Amniocentesis discussed: not indicated. Follow up in 4 weeks. 60% of 30 min visit spent on counseling and coordination of care.  Recommend tobacco cessation

## 2013-10-21 NOTE — Progress Notes (Signed)
Reports occasional pelvic pressure/cramping when at work all day.  New OB labs and early 1hr gtt today.  New OB packet given.  Discussed appropriate weight gain based on BMI (11-20lb); pt. Verbalized understanding.  Pt. Requests RX for prenatal vitamin-- lost paper script from [facility].

## 2013-10-21 NOTE — Patient Instructions (Addendum)
Pregnancy - First Trimester During sexual intercourse, millions of sperm go into the vagina. Only 1 sperm will penetrate and fertilize the female egg while it is in the Fallopian tube. One week later, the fertilized egg implants into the wall of the uterus. An embryo begins to develop into a baby. At 6 to 8 weeks, the eyes and face are formed and the heartbeat can be seen on ultrasound. At the end of 12 weeks (first trimester), all the baby's organs are formed. Now that you are pregnant, you will want to do everything you can to have a healthy baby. Two of the most important things are to get good prenatal care and follow your caregiver's instructions. Prenatal care is all the medical care you receive before the baby's birth. It is given to prevent, find, and treat problems during the pregnancy and childbirth. PRENATAL EXAMS  During prenatal visits, your weight, blood pressure, and urine are checked. This is done to make sure you are healthy and progressing normally during the pregnancy.  A pregnant woman should gain 25 to 35 pounds during the pregnancy. However, if you are overweight or underweight, your caregiver will advise you regarding your weight.  Your caregiver will ask and answer questions for you.  Blood work, cervical cultures, other necessary tests, and a Pap test are done during your prenatal exams. These tests are done to check on your health and the probable health of your baby. Tests are strongly recommended and done for HIV with your permission. This is the virus that causes AIDS. These tests are done because medicines can be given to help prevent your baby from being born with this infection should you have been infected without knowing it. Blood work is also used to find out your blood type, previous infections, and follow your blood levels (hemoglobin).  Low hemoglobin (anemia) is common during pregnancy. Iron and vitamins are given to help prevent this. Later in the pregnancy, blood  tests for diabetes will be done along with any other tests if any problems develop.  You may need other tests to make sure you and the baby are doing well. CHANGES DURING THE FIRST TRIMESTER  Your body goes through many changes during pregnancy. They vary from person to person. Talk to your caregiver about changes you notice and are concerned about. Changes can include:  Your menstrual period stops.  The egg and sperm carry the genes that determine what you look like. Genes from you and your partner are forming a baby. The female genes determine whether the baby is a boy or a girl.  Your body increases in girth and you may feel bloated.  Feeling sick to your stomach (nauseous) and throwing up (vomiting). If the vomiting is uncontrollable, call your caregiver.  Your breasts will begin to enlarge and become tender.  Your nipples may stick out more and become darker.  The need to urinate more. Painful urination may mean you have a bladder infection.  Tiring easily.  Loss of appetite.  Cravings for certain kinds of food.  At first, you may gain or lose a couple of pounds.  You may have changes in your emotions from day to day (excited to be pregnant or concerned something may go wrong with the pregnancy and baby).  You may have more vivid and strange dreams. HOME CARE INSTRUCTIONS   It is very important to avoid all smoking, alcohol and non-prescribed drugs during your pregnancy. These affect the formation and growth of the baby.   Avoid chemicals while pregnant to ensure the delivery of a healthy infant.  Start your prenatal visits by the 12th week of pregnancy. They are usually scheduled monthly at first, then more often in the last 2 months before delivery. Keep your caregiver's appointments. Follow your caregiver's instructions regarding medicine use, blood and lab tests, exercise, and diet.  During pregnancy, you are providing food for you and your baby. Eat regular, well-balanced  meals. Choose foods such as meat, fish, milk and other low fat dairy products, vegetables, fruits, and whole-grain breads and cereals. Your caregiver will tell you of the ideal weight gain.  You can help morning sickness by keeping soda crackers at the bedside. Eat a couple before arising in the morning. You may want to use the crackers without salt on them.  Eating 4 to 5 small meals rather than 3 large meals a day also may help the nausea and vomiting.  Drinking liquids between meals instead of during meals also seems to help nausea and vomiting.  A physical sexual relationship may be continued throughout pregnancy if there are no other problems. Problems may be early (premature) leaking of amniotic fluid from the membranes, vaginal bleeding, or belly (abdominal) pain.  Exercise regularly if there are no restrictions. Check with your caregiver or physical therapist if you are unsure of the safety of some of your exercises. Greater weight gain will occur in the last 2 trimesters of pregnancy. Exercising will help:  Control your weight.  Keep you in shape.  Prepare you for labor and delivery.  Help you lose your pregnancy weight after you deliver your baby.  Wear a good support or jogging bra for breast tenderness during pregnancy. This may help if worn during sleep too.  Ask when prenatal classes are available. Begin classes when they are offered.  Do not use hot tubs, steam rooms, or saunas.  Wear your seat belt when driving. This protects you and your baby if you are in an accident.  Avoid raw meat, uncooked cheese, cat litter boxes, and soil used by cats throughout the pregnancy. These carry germs that can cause birth defects in the baby.  The first trimester is a good time to visit your dentist for your dental health. Getting your teeth cleaned is okay. Use a softer toothbrush and brush gently during pregnancy.  Ask for help if you have financial, counseling, or nutritional needs  during pregnancy. Your caregiver will be able to offer counseling for these needs as well as refer you for other special needs.  Do not take any medicines or herbs unless told by your caregiver.  Inform your caregiver if there is any mental or physical domestic violence.  Make a list of emergency phone numbers of family, friends, hospital, and police and fire departments.  Write down your questions. Take them to your prenatal visit.  Do not douche.  Do not cross your legs.  If you have to stand for long periods of time, rotate you feet or take small steps in a circle.  You may have more vaginal secretions that may require a sanitary pad. Do not use tampons or scented sanitary pads. MEDICINES AND DRUG USE IN PREGNANCY  Take prenatal vitamins as directed. The vitamin should contain 1 milligram of folic acid. Keep all vitamins out of reach of children. Only a couple vitamins or tablets containing iron may be fatal to a baby or young child when ingested.  Avoid use of all medicines, including herbs, over-the-counter medicines, not   prescribed or suggested by your caregiver. Only take over-the-counter or prescription medicines for pain, discomfort, or fever as directed by your caregiver. Do not use aspirin, ibuprofen, or naproxen unless directed by your caregiver.  Let your caregiver also know about herbs you may be using.  Alcohol is related to a number of birth defects. This includes fetal alcohol syndrome. All alcohol, in any form, should be avoided completely. Smoking will cause low birth rate and premature babies.  Street or illegal drugs are very harmful to the baby. They are absolutely forbidden. A baby born to an addicted mother will be addicted at birth. The baby will go through the same withdrawal an adult does.  Let your caregiver know about any medicines that you have to take and for what reason you take them. SEEK MEDICAL CARE IF:  You have any concerns or worries during your  pregnancy. It is better to call with your questions if you feel they cannot wait, rather than worry about them. SEEK IMMEDIATE MEDICAL CARE IF:   An unexplained oral temperature above 102 F (38.9 C) develops, or as your caregiver suggests.  You have leaking of fluid from the vagina (birth canal). If leaking membranes are suspected, take your temperature and inform your caregiver of this when you call.  There is vaginal spotting or bleeding. Notify your caregiver of the amount and how many pads are used.  You develop a bad smelling vaginal discharge with a change in the color.  You continue to feel sick to your stomach (nauseated) and have no relief from remedies suggested. You vomit blood or coffee ground-like materials.  You lose more than 2 pounds of weight in 1 week.  You gain more than 2 pounds of weight in 1 week and you notice swelling of your face, hands, feet, or legs.  You gain 5 pounds or more in 1 week (even if you do not have swelling of your hands, face, legs, or feet).  You get exposed to [place] measles and have never had them.  You are exposed to fifth disease or chickenpox.  You develop belly (abdominal) pain. Round ligament discomfort is a common non-cancerous (benign) cause of abdominal pain in pregnancy. Your caregiver still must evaluate this.  You develop headache, fever, diarrhea, pain with urination, or shortness of breath.  You fall or are in a car accident or have any kind of trauma.  There is mental or physical violence in your home. Document Released: 04/11/2001 Document Revised: 01/10/2012 Document Reviewed: 02/25/2013 [facility] Patient Information 2015 [place], [place]. This information is not intended to replace advice given to you by your health care provider. Make sure you discuss any questions you have with your health care provider. Breastfeeding Twins or Multiples Breastfeeding benefits you and your babies. Breastfeeding:  Causes the uterus to  contract and return to its original size faster.  Releases hormones that relax you.  Saves money and time. Your milk is available whenever your babies are ready to feed, and it is at the right temperature.  Ensures your babies get the best nutrition. Breast milk is especially beneficial to multiples, who are often small at birth and need all the advantages breast milk can provide.  Creates a unique bond between you and each of your babies. HOW DO I GET STARTED? Nurse as soon as possible after delivery and as often as your babies want to be nursed. This will stimulate your breasts to produce enough milk. Mothers of multiples almost always produce enough  milk for all their babies.  If your babies are premature and unable to nurse, you can pump your breasts and freeze the milk until your babies are ready to feed at the breast. To stimulate a milk supply, your breasts need to be emptied at least 8-10 times in a 24-hour period. Ask a lactation specialist to help you choose an effective breast pump and to provide guidance in helping your babies latch on to and feed from the breast when they are ready.  SHOULD I NURSE MY BABIES TOGETHER? Many mothers of multiples find it easiest to nurse two babies at the same time. Nursing babies together may also increase milk-producing hormone (prolactin) levels. The more often the babies nurse effectively, the more milk you will produce. When nursing your babies together:  Ask your nurse or lactation specialist to suggest tips on positioning. There are several positions and holds that make it easier to nurse more than one baby at a time.  Try placing pillows under your arms, legs, and the babies for comfort. Nursing two babies at the same time often gets easier as the babies get older and more experienced at latching on to the breast.  Switch the babies from one side to the other at alternate feedings. For example, if baby A feeds from the right breast and baby B  feeds from the left breast, then at the next feeding baby A should take the left breast and baby B the right breast. This ensures that both breasts get equal amounts of stimulation. It also allows the stronger sucking baby to increase the milk supply for the baby whose suck is weaker. WHAT ARE SOME TIPS TO INCREASE MY SUCCESS?  If you are nursing babies together and one of the babies is having difficulty latching or sucking, try nursing that baby separately so you can give him or her your full attention.  If you are nursing babies separately and one of the babies is having difficulty feeding, it may help to nurse that baby at the same time as his or her sibling. The baby with the stronger or more effective suck will stimulate the mother's milk to flow faster. This will encourage the baby who is having difficulty to suck and swallow correctly.   Try not to give your babies bottles and pacifiers during the early weeks of breastfeeding. Avoiding these things encourages effective sucking patterns and helps establish a good milk supply. You should not need supplemental feedings if you empty your breasts with each feeding.   A good latch for both infants is important in helping the babies empty the breast effectively and for avoiding sore nipples. The most common cause of sore nipples is an improper latch.   Keep track of each baby's stools and wet diapers for the first 6 weeks to make sure each baby is getting enough milk. Each baby should have 6-8 wet diapers and 2 or more bowel movements per day.  Document Released: 08/15/2004 Document Revised: 04/22/2013 Document Reviewed: 01/27/2013 [facility] Patient Information 2015 [place], [place]. This information is not intended to replace advice given to you by your health care provider. Make sure you discuss any questions you have with your health care provider.

## 2013-10-21 NOTE — Addendum Note (Signed)
Addended by: Novella Olive on: 10/21/2013 11:03 AM   Modules accepted: Orders

## 2013-10-22 ENCOUNTER — Encounter: Payer: Self-pay | Admitting: Obstetrics & Gynecology

## 2013-10-22 LAB — OBSTETRIC PANEL
Antibody Screen: NEGATIVE
Basophils Absolute: 0 10*3/uL (ref 0.0–0.1)
Basophils Relative: 0 % (ref 0–1)
Eosinophils Absolute: 0.1 10*3/uL (ref 0.0–0.7)
Eosinophils Relative: 1 % (ref 0–5)
HCT: 33.5 % — ABNORMAL LOW (ref 36.0–46.0)
Hemoglobin: 11.6 g/dL — ABNORMAL LOW (ref 12.0–15.0)
Hepatitis B Surface Ag: NEGATIVE
Lymphocytes Relative: 26 % (ref 12–46)
Lymphs Abs: 1.9 10*3/uL (ref 0.7–4.0)
MCH: 31.1 pg (ref 26.0–34.0)
MCHC: 34.6 g/dL (ref 30.0–36.0)
MCV: 89.8 fL (ref 78.0–100.0)
Monocytes Absolute: 0.4 10*3/uL (ref 0.1–1.0)
Monocytes Relative: 5 % (ref 3–12)
Neutro Abs: 4.9 10*3/uL (ref 1.7–7.7)
Neutrophils Relative %: 68 % (ref 43–77)
Platelets: 322 10*3/uL (ref 150–400)
RBC: 3.73 MIL/uL — ABNORMAL LOW (ref 3.87–5.11)
RDW: 13.7 % (ref 11.5–15.5)
Rh Type: POSITIVE
Rubella: 1.02 Index — ABNORMAL HIGH (ref <0.90)
WBC: 7.2 10*3/uL (ref 4.0–10.5)

## 2013-10-22 LAB — HIV ANTIBODY (ROUTINE TESTING W REFLEX): HIV 1&2 Ab, 4th Generation: NONREACTIVE

## 2013-10-22 LAB — CYTOLOGY - PAP

## 2013-10-22 LAB — GLUCOSE TOLERANCE, 1 HOUR (50G) W/O FASTING: Glucose, 1 Hour GTT: 54 mg/dL — ABNORMAL LOW (ref 70–140)

## 2013-10-23 LAB — CULTURE, OB URINE
Colony Count: NO GROWTH
Organism ID, Bacteria: NO GROWTH

## 2013-10-26 LAB — CANNABANOIDS (GC/LC/MS), URINE: THC-COOH (GC/LC/MS), ur confirm: 402 ng/mL — AB (ref <5)

## 2013-10-28 ENCOUNTER — Encounter: Payer: Self-pay | Admitting: Obstetrics & Gynecology

## 2013-10-28 DIAGNOSIS — O99321 Drug use complicating pregnancy, first trimester: Secondary | ICD-10-CM | POA: Insufficient documentation

## 2013-10-28 LAB — PRESCRIPTION MONITORING PROFILE (19 PANEL)
Amphetamine/Meth: NEGATIVE ng/mL
Barbiturate Screen, Urine: NEGATIVE ng/mL
Benzodiazepine Screen, Urine: NEGATIVE ng/mL
Buprenorphine, Urine: NEGATIVE ng/mL
Carisoprodol, Urine: NEGATIVE ng/mL
Cocaine Metabolites: NEGATIVE ng/mL
Creatinine, Urine: 193.51 mg/dL (ref >20.0)
Fentanyl, Ur: NEGATIVE ng/mL
MDMA URINE: NEGATIVE ng/mL
Meperidine, Ur: NEGATIVE ng/mL
Methadone Screen, Urine: NEGATIVE ng/mL
Methaqualone: NEGATIVE ng/mL
Nitrites, Initial: NEGATIVE ug/mL
Opiate Screen, Urine: NEGATIVE ng/mL
Oxycodone Screen, Ur: NEGATIVE ng/mL
Phencyclidine, Ur: NEGATIVE ng/mL
Propoxyphene: NEGATIVE ng/mL
Tapentadol, urine: NEGATIVE ng/mL
Tramadol Scrn, Ur: NEGATIVE ng/mL
Zolpidem, Urine: NEGATIVE ng/mL
pH, Initial: 6 pH (ref 4.5–8.9)

## 2013-11-18 ENCOUNTER — Encounter: Payer: Self-pay | Admitting: Obstetrics & Gynecology

## 2013-11-24 ENCOUNTER — Telehealth: Payer: Self-pay | Admitting: Obstetrics & Gynecology

## 2013-11-24 NOTE — Telephone Encounter (Signed)
Patient called and stated she had started a new job, and she wanted to reschedule her appointment until the end of August.

## 2013-11-25 ENCOUNTER — Telehealth: Payer: Self-pay | Admitting: *Deleted

## 2013-11-25 ENCOUNTER — Other Ambulatory Visit: Payer: Self-pay | Admitting: *Deleted

## 2013-11-25 DIAGNOSIS — O30002 Twin pregnancy, unspecified number of placenta and unspecified number of amniotic sacs, second trimester: Secondary | ICD-10-CM

## 2013-11-25 NOTE — Telephone Encounter (Signed)
Pt called clinic requesting an appointment for ultrasound for sex of her babies.  Appointment made August 11 at 0930.  Attempted to contact patient, left detailed message with appointment date/time/location.  If patient has any questions to return call to clinic.

## 2013-12-04 ENCOUNTER — Encounter: Payer: Self-pay | Admitting: Family

## 2013-12-09 ENCOUNTER — Ambulatory Visit (HOSPITAL_COMMUNITY): Payer: Medicaid Other

## 2013-12-10 ENCOUNTER — Encounter: Payer: Self-pay | Admitting: Obstetrics & Gynecology

## 2013-12-10 ENCOUNTER — Ambulatory Visit (HOSPITAL_COMMUNITY)
Admission: RE | Admit: 2013-12-10 | Discharge: 2013-12-10 | Disposition: A | Discharge status: Home / Self Care | Payer: Medicaid Other | Source: Ambulatory Visit | Attending: Obstetrics & Gynecology | Admitting: Obstetrics & Gynecology

## 2013-12-10 ENCOUNTER — Other Ambulatory Visit: Payer: Self-pay | Admitting: Obstetrics & Gynecology

## 2013-12-10 DIAGNOSIS — O99331 Smoking (tobacco) complicating pregnancy, first trimester: Secondary | ICD-10-CM

## 2013-12-10 DIAGNOSIS — O99321 Drug use complicating pregnancy, first trimester: Secondary | ICD-10-CM

## 2013-12-10 DIAGNOSIS — O30009 Twin pregnancy, unspecified number of placenta and unspecified number of amniotic sacs, unspecified trimester: Secondary | ICD-10-CM | POA: Insufficient documentation

## 2013-12-10 DIAGNOSIS — Z3689 Encounter for other specified antenatal screening: Secondary | ICD-10-CM | POA: Insufficient documentation

## 2013-12-10 DIAGNOSIS — O30002 Twin pregnancy, unspecified number of placenta and unspecified number of amniotic sacs, second trimester: Secondary | ICD-10-CM

## 2013-12-10 DIAGNOSIS — IMO0002 Reserved for concepts with insufficient information to code with codable children: Secondary | ICD-10-CM

## 2013-12-25 ENCOUNTER — Encounter: Payer: Self-pay | Admitting: Obstetrics & Gynecology

## 2013-12-25 ENCOUNTER — Ambulatory Visit (INDEPENDENT_AMBULATORY_CARE_PROVIDER_SITE_OTHER): Payer: Medicaid Other | Admitting: Obstetrics & Gynecology

## 2013-12-25 VITALS — BP 105/56 | HR 67 | Temp 97.7°F | Wt 187.0 lb

## 2013-12-25 DIAGNOSIS — IMO0002 Reserved for concepts with insufficient information to code with codable children: Secondary | ICD-10-CM

## 2013-12-25 DIAGNOSIS — O09899 Supervision of other high risk pregnancies, unspecified trimester: Secondary | ICD-10-CM | POA: Insufficient documentation

## 2013-12-25 DIAGNOSIS — O34599 Maternal care for other abnormalities of gravid uterus, unspecified trimester: Secondary | ICD-10-CM

## 2013-12-25 DIAGNOSIS — O09212 Supervision of pregnancy with history of pre-term labor, second trimester: Secondary | ICD-10-CM

## 2013-12-25 DIAGNOSIS — O09892 Supervision of other high risk pregnancies, second trimester: Secondary | ICD-10-CM

## 2013-12-25 DIAGNOSIS — D279 Benign neoplasm of unspecified ovary: Secondary | ICD-10-CM

## 2013-12-25 DIAGNOSIS — Z0489 Encounter for examination and observation for other specified reasons: Secondary | ICD-10-CM

## 2013-12-25 DIAGNOSIS — O3480 Maternal care for other abnormalities of pelvic organs, unspecified trimester: Secondary | ICD-10-CM

## 2013-12-25 DIAGNOSIS — O09219 Supervision of pregnancy with history of pre-term labor, unspecified trimester: Secondary | ICD-10-CM

## 2013-12-25 DIAGNOSIS — Z1389 Encounter for screening for other disorder: Secondary | ICD-10-CM

## 2013-12-25 LAB — POCT URINALYSIS DIP (DEVICE)
Bilirubin Urine: NEGATIVE
Glucose, UA: NEGATIVE mg/dL
Hgb urine dipstick: NEGATIVE
Ketones, ur: NEGATIVE mg/dL
Leukocytes, UA: NEGATIVE
Nitrite: NEGATIVE
Protein, ur: NEGATIVE mg/dL
Specific Gravity, Urine: 1.015 (ref 1.005–1.030)
Urobilinogen, UA: 0.2 mg/dL (ref 0.0–1.0)
pH: 6 (ref 5.0–8.0)

## 2013-12-25 NOTE — Patient Instructions (Signed)
Return to clinic for any obstetric concerns or go to [facility] for evaluation  

## 2013-12-25 NOTE — Progress Notes (Signed)
18 week scan showed early demise of one of the twins, limited anatomy scan of surviving fetus. Patient is aware, support given. Will rescan around 24 weeks. Had 36 week SVD during her first pregnancy. Subsequent pregnancies delivered at 40 weeks, did not receive any 17P or other prenatal intervention. Declines 17P for this pregnancy. Declines quad screen or other genetic screening. 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 Torsion precautions reviewed. No other complaints or concerns.  Routine obstetric precautions reviewed.

## 2013-12-25 NOTE — Progress Notes (Signed)
Ultrasound scheduled MFM 01/19/14 @ 9a.

## 2014-01-19 ENCOUNTER — Encounter: Payer: Self-pay | Admitting: Obstetrics & Gynecology

## 2014-01-19 ENCOUNTER — Ambulatory Visit (HOSPITAL_COMMUNITY)
Admission: RE | Admit: 2014-01-19 | Discharge: 2014-01-19 | Disposition: A | Discharge status: Home / Self Care | Payer: Medicaid Other | Source: Ambulatory Visit | Attending: Obstetrics & Gynecology | Admitting: Obstetrics & Gynecology

## 2014-01-19 VITALS — BP 110/61 | HR 75 | Wt 198.2 lb

## 2014-01-19 DIAGNOSIS — Z1389 Encounter for screening for other disorder: Secondary | ICD-10-CM | POA: Insufficient documentation

## 2014-01-19 DIAGNOSIS — Z0489 Encounter for examination and observation for other specified reasons: Secondary | ICD-10-CM

## 2014-01-19 DIAGNOSIS — O99330 Smoking (tobacco) complicating pregnancy, unspecified trimester: Secondary | ICD-10-CM | POA: Insufficient documentation

## 2014-01-19 DIAGNOSIS — D279 Benign neoplasm of unspecified ovary: Secondary | ICD-10-CM | POA: Insufficient documentation

## 2014-01-19 DIAGNOSIS — O99331 Smoking (tobacco) complicating pregnancy, first trimester: Secondary | ICD-10-CM

## 2014-01-19 DIAGNOSIS — IMO0002 Reserved for concepts with insufficient information to code with codable children: Secondary | ICD-10-CM | POA: Diagnosis not present

## 2014-01-19 DIAGNOSIS — Z363 Encounter for antenatal screening for malformations: Secondary | ICD-10-CM | POA: Insufficient documentation

## 2014-01-19 DIAGNOSIS — O3480 Maternal care for other abnormalities of pelvic organs, unspecified trimester: Secondary | ICD-10-CM

## 2014-01-19 DIAGNOSIS — O34599 Maternal care for other abnormalities of gravid uterus, unspecified trimester: Secondary | ICD-10-CM | POA: Diagnosis not present

## 2014-02-25 ENCOUNTER — Ambulatory Visit (HOSPITAL_COMMUNITY)
Admission: RE | Admit: 2014-02-25 | Discharge: 2014-02-25 | Disposition: A | Discharge status: Home / Self Care | Payer: Medicaid Other | Source: Ambulatory Visit | Attending: Obstetrics & Gynecology | Admitting: Obstetrics & Gynecology

## 2014-02-25 ENCOUNTER — Encounter: Payer: Self-pay | Admitting: Obstetrics and Gynecology

## 2014-02-25 ENCOUNTER — Ambulatory Visit (INDEPENDENT_AMBULATORY_CARE_PROVIDER_SITE_OTHER): Payer: Medicaid Other | Admitting: Physician Assistant

## 2014-02-25 VITALS — BP 115/60 | HR 76 | Wt 201.2 lb

## 2014-02-25 VITALS — BP 113/56 | HR 78 | Temp 98.2°F | Wt 202.3 lb

## 2014-02-25 DIAGNOSIS — Z3A29 29 weeks gestation of pregnancy: Secondary | ICD-10-CM | POA: Insufficient documentation

## 2014-02-25 DIAGNOSIS — O3113X1 Continuing pregnancy after spontaneous abortion of one fetus or more, third trimester, fetus 1: Secondary | ICD-10-CM | POA: Diagnosis not present

## 2014-02-25 DIAGNOSIS — O99331 Smoking (tobacco) complicating pregnancy, first trimester: Secondary | ICD-10-CM

## 2014-02-25 DIAGNOSIS — O99333 Smoking (tobacco) complicating pregnancy, third trimester: Secondary | ICD-10-CM | POA: Insufficient documentation

## 2014-02-25 DIAGNOSIS — IMO0002 Reserved for concepts with insufficient information to code with codable children: Secondary | ICD-10-CM

## 2014-02-25 DIAGNOSIS — O09213 Supervision of pregnancy with history of pre-term labor, third trimester: Secondary | ICD-10-CM | POA: Insufficient documentation

## 2014-02-25 DIAGNOSIS — O30003 Twin pregnancy, unspecified number of placenta and unspecified number of amniotic sacs, third trimester: Secondary | ICD-10-CM | POA: Diagnosis not present

## 2014-02-25 DIAGNOSIS — Z23 Encounter for immunization: Secondary | ICD-10-CM

## 2014-02-25 DIAGNOSIS — O3110X0 Continuing pregnancy after spontaneous abortion of one fetus or more, unspecified trimester, not applicable or unspecified: Secondary | ICD-10-CM | POA: Diagnosis not present

## 2014-02-25 DIAGNOSIS — O30009 Twin pregnancy, unspecified number of placenta and unspecified number of amniotic sacs, unspecified trimester: Secondary | ICD-10-CM

## 2014-02-25 DIAGNOSIS — O09893 Supervision of other high risk pregnancies, third trimester: Secondary | ICD-10-CM

## 2014-02-25 LAB — CBC
HCT: 31.9 % — ABNORMAL LOW (ref 36.0–46.0)
Hemoglobin: 11.1 g/dL — ABNORMAL LOW (ref 12.0–15.0)
MCH: 32.2 pg (ref 26.0–34.0)
MCHC: 34.8 g/dL (ref 30.0–36.0)
MCV: 92.5 fL (ref 78.0–100.0)
Platelets: 322 10*3/uL (ref 150–400)
RBC: 3.45 MIL/uL — ABNORMAL LOW (ref 3.87–5.11)
RDW: 13.1 % (ref 11.5–15.5)
WBC: 8 10*3/uL (ref 4.0–10.5)

## 2014-02-25 LAB — HIV ANTIBODY (ROUTINE TESTING W REFLEX): HIV 1&2 Ab, 4th Generation: NONREACTIVE

## 2014-02-25 LAB — POCT URINALYSIS DIP (DEVICE)
Bilirubin Urine: NEGATIVE
Glucose, UA: NEGATIVE mg/dL
Hgb urine dipstick: NEGATIVE
Ketones, ur: NEGATIVE mg/dL
Leukocytes, UA: NEGATIVE
Nitrite: NEGATIVE
Protein, ur: NEGATIVE mg/dL
Specific Gravity, Urine: 1.02 (ref 1.005–1.030)
Urobilinogen, UA: 0.2 mg/dL (ref 0.0–1.0)
pH: 6.5 (ref 5.0–8.0)

## 2014-02-25 LAB — RPR

## 2014-02-25 MED ORDER — TETANUS-DIPHTH-ACELL PERTUSSIS 5-2.5-18.5 LF-MCG/0.5 IM SUSP: 0.5000 mL | Freq: Once | INTRAMUSCULAR | Status: DC

## 2014-02-25 NOTE — Progress Notes (Signed)
29 weeks.  Pt denies Marijuana in the last month.  Baby is moving well.  She denies LOF, dysuria, vaginal bleeding.    Cont PNV qd PTL precautions discussed 28 week labs including GTT today.   RTC 2 weeks

## 2014-02-25 NOTE — Progress Notes (Signed)
Flu vaccine, tdap vaccine today

## 2014-02-26 LAB — GLUCOSE TOLERANCE, 1 HOUR (50G) W/O FASTING: Glucose, 1 Hour GTT: 81 mg/dL (ref 70–140)

## 2014-02-27 ENCOUNTER — Encounter: Payer: Self-pay | Admitting: General Practice

## 2014-03-02 ENCOUNTER — Encounter: Payer: Self-pay | Admitting: Obstetrics & Gynecology

## 2014-03-02 ENCOUNTER — Ambulatory Visit (HOSPITAL_COMMUNITY): Payer: Self-pay

## 2014-03-02 ENCOUNTER — Telehealth: Payer: Self-pay

## 2014-03-02 NOTE — Telephone Encounter (Signed)
Patient called to front office stating she is having a horrible toothache and wants to know what she can take. Told patient she can take tylenol. Patient verbalized understanding and had no questions

## 2014-03-11 ENCOUNTER — Encounter: Payer: Self-pay | Admitting: Advanced Practice Midwife

## 2014-03-18 ENCOUNTER — Ambulatory Visit (INDEPENDENT_AMBULATORY_CARE_PROVIDER_SITE_OTHER): Payer: Medicaid Other | Admitting: Advanced Practice Midwife

## 2014-03-18 VITALS — BP 117/70 | HR 86 | Temp 97.9°F | Wt 197.9 lb

## 2014-03-18 DIAGNOSIS — O26893 Other specified pregnancy related conditions, third trimester: Secondary | ICD-10-CM

## 2014-03-18 DIAGNOSIS — N898 Other specified noninflammatory disorders of vagina: Secondary | ICD-10-CM

## 2014-03-18 LAB — POCT URINALYSIS DIP (DEVICE)
Bilirubin Urine: NEGATIVE
Glucose, UA: NEGATIVE mg/dL
Hgb urine dipstick: NEGATIVE
Ketones, ur: NEGATIVE mg/dL
Nitrite: NEGATIVE
Protein, ur: NEGATIVE mg/dL
Specific Gravity, Urine: 1.01 (ref 1.005–1.030)
Urobilinogen, UA: 0.2 mg/dL (ref 0.0–1.0)
pH: 6.5 (ref 5.0–8.0)

## 2014-03-18 NOTE — Progress Notes (Signed)
Doing well.  Good fetal movement, denies vaginal bleeding, LOF, regular contractions.  Does report increase in white discharge this week, no itching/burning.  Pelvic exam normal with little discharge noted, wet prep collected.

## 2014-03-18 NOTE — Progress Notes (Signed)
Pt is having white discharge that she is concerned about today.

## 2014-03-19 ENCOUNTER — Telehealth: Payer: Self-pay | Admitting: *Deleted

## 2014-03-19 LAB — WET PREP, GENITAL
Clue Cells Wet Prep HPF POC: NONE SEEN
Trich, Wet Prep: NONE SEEN
WBC, Wet Prep HPF POC: NONE SEEN
Yeast Wet Prep HPF POC: NONE SEEN

## 2014-03-19 NOTE — Telephone Encounter (Signed)
Patient called to see if its okay for her to take Amoxicillin for her tooth. She also needs to have dental extractions done. Called patient back and left message that we are returning her call.

## 2014-03-23 NOTE — Telephone Encounter (Signed)
Pt left new message @ 1046 today and stated that a detailed message can be left on her voice mail. Cell # [phone]. I called pt and advised her that it is safe for her to take Amoxicillin.  She also wanted to know if she can have teeth extractions performed and I said yes. Her dental appt is on 12/2 following her [place] clinic appt and she can obtain letter for her dentist on that day. Pt voiced understanding of all information and instructions given.

## 2014-03-23 NOTE — Telephone Encounter (Signed)
Called patient and left message that we are returning her call if she still has questions or concerns please call us back.

## 2014-03-25 ENCOUNTER — Ambulatory Visit (HOSPITAL_COMMUNITY): Payer: Medicaid Other

## 2014-04-01 ENCOUNTER — Encounter: Payer: Self-pay | Admitting: Obstetrics and Gynecology

## 2014-04-01 ENCOUNTER — Encounter (HOSPITAL_COMMUNITY): Payer: Self-pay

## 2014-04-01 ENCOUNTER — Ambulatory Visit (HOSPITAL_COMMUNITY)
Admission: RE | Admit: 2014-04-01 | Discharge: 2014-04-01 | Disposition: A | Discharge status: Home / Self Care | Payer: Medicaid Other | Source: Ambulatory Visit | Attending: Obstetrics & Gynecology | Admitting: Obstetrics & Gynecology

## 2014-04-01 ENCOUNTER — Ambulatory Visit (INDEPENDENT_AMBULATORY_CARE_PROVIDER_SITE_OTHER): Payer: Medicaid Other | Admitting: Physician Assistant

## 2014-04-01 VITALS — BP 125/67 | HR 91 | Temp 98.4°F | Wt 200.4 lb

## 2014-04-01 DIAGNOSIS — O30043 Twin pregnancy, dichorionic/diamniotic, third trimester: Secondary | ICD-10-CM | POA: Diagnosis present

## 2014-04-01 DIAGNOSIS — F17210 Nicotine dependence, cigarettes, uncomplicated: Secondary | ICD-10-CM | POA: Diagnosis not present

## 2014-04-01 DIAGNOSIS — IMO0002 Reserved for concepts with insufficient information to code with codable children: Secondary | ICD-10-CM

## 2014-04-01 DIAGNOSIS — O99331 Smoking (tobacco) complicating pregnancy, first trimester: Secondary | ICD-10-CM

## 2014-04-01 DIAGNOSIS — O99330 Smoking (tobacco) complicating pregnancy, unspecified trimester: Secondary | ICD-10-CM | POA: Insufficient documentation

## 2014-04-01 DIAGNOSIS — O09293 Supervision of pregnancy with other poor reproductive or obstetric history, third trimester: Secondary | ICD-10-CM | POA: Insufficient documentation

## 2014-04-01 DIAGNOSIS — F1290 Cannabis use, unspecified, uncomplicated: Secondary | ICD-10-CM | POA: Insufficient documentation

## 2014-04-01 DIAGNOSIS — O3123X1 Continuing pregnancy after intrauterine death of one fetus or more, third trimester, fetus 1: Secondary | ICD-10-CM | POA: Diagnosis not present

## 2014-04-01 DIAGNOSIS — Z3A34 34 weeks gestation of pregnancy: Secondary | ICD-10-CM | POA: Insufficient documentation

## 2014-04-01 DIAGNOSIS — N9489 Other specified conditions associated with female genital organs and menstrual cycle: Secondary | ICD-10-CM | POA: Diagnosis not present

## 2014-04-01 DIAGNOSIS — O99323 Drug use complicating pregnancy, third trimester: Secondary | ICD-10-CM | POA: Insufficient documentation

## 2014-04-01 DIAGNOSIS — O99333 Smoking (tobacco) complicating pregnancy, third trimester: Secondary | ICD-10-CM | POA: Diagnosis not present

## 2014-04-01 DIAGNOSIS — Z3483 Encounter for supervision of other normal pregnancy, third trimester: Secondary | ICD-10-CM

## 2014-04-01 NOTE — Progress Notes (Signed)
Pt has yeast infection currently on Monistat cream (generic)

## 2014-04-01 NOTE — Patient Instructions (Signed)
Third Trimester of Pregnancy The third trimester is from week 29 through week 42, months 7 through 9. The third trimester is a time when the fetus is growing rapidly. At the end of the ninth month, the fetus is about 20 inches in length and weighs 6-10 pounds.  BODY CHANGES Your body goes through many changes during pregnancy. The changes vary from woman to woman.   Your weight will continue to increase. You can expect to gain 25-35 pounds (11-16 kg) by the end of the pregnancy.  You may begin to get stretch marks on your hips, abdomen, and breasts.  You may urinate more often because the fetus is moving lower into your pelvis and pressing on your bladder.  You may develop or continue to have heartburn as a result of your pregnancy.  You may develop constipation because certain hormones are causing the muscles that push waste through your intestines to slow down.  You may develop hemorrhoids or swollen, bulging veins (varicose veins).  You may have pelvic pain because of the weight gain and pregnancy hormones relaxing your joints between the bones in your pelvis. Backaches may result from overexertion of the muscles supporting your posture.  You may have changes in your hair. These can include thickening of your hair, rapid growth, and changes in texture. Some women also have hair loss during or after pregnancy, or hair that feels dry or thin. Your hair will most likely return to normal after your baby is born.  Your breasts will continue to grow and be tender. A yellow discharge may leak from your breasts called colostrum.  Your belly button may stick out.  You may feel short of breath because of your expanding uterus.  You may notice the fetus "dropping," or moving lower in your abdomen.  You may have a bloody mucus discharge. This usually occurs a few days to a week before labor begins.  Your cervix becomes thin and soft (effaced) near your due date. WHAT TO EXPECT AT YOUR PRENATAL  EXAMS  You will have prenatal exams every 2 weeks until week 36. Then, you will have weekly prenatal exams. During a routine prenatal visit:  You will be weighed to make sure you and the fetus are growing normally.  Your blood pressure is taken.  Your abdomen will be measured to track your baby's growth.  The fetal heartbeat will be listened to.  Any test results from the previous visit will be discussed.  You may have a cervical check near your due date to see if you have effaced. At around 36 weeks, your caregiver will check your cervix. At the same time, your caregiver will also perform a test on the secretions of the vaginal tissue. This test is to determine if a type of bacteria, Group B streptococcus, is present. Your caregiver will explain this further. Your caregiver may ask you:  What your birth plan is.  How you are feeling.  If you are feeling the baby move.  If you have had any abnormal symptoms, such as leaking fluid, bleeding, severe headaches, or abdominal cramping.  If you have any questions. Other tests or screenings that may be performed during your third trimester include:  Blood tests that check for low iron levels (anemia).  Fetal testing to check the health, activity level, and growth of the fetus. Testing is done if you have certain medical conditions or if there are problems during the pregnancy. FALSE LABOR You may feel small, irregular contractions that   eventually go away. These are called Braxton Hicks contractions, or false labor. Contractions may last for hours, days, or even weeks before true labor sets in. If contractions come at regular intervals, intensify, or become painful, it is best to be seen by your caregiver.  SIGNS OF LABOR   Menstrual-like cramps.  Contractions that are 5 minutes apart or less.  Contractions that start on the top of the uterus and spread down to the lower abdomen and back.  A sense of increased pelvic pressure or back  pain.  A watery or bloody mucus discharge that comes from the vagina. If you have any of these signs before the 37th week of pregnancy, call your caregiver right away. You need to go to the hospital to get checked immediately. HOME CARE INSTRUCTIONS   Avoid all smoking, herbs, alcohol, and unprescribed drugs. These chemicals affect the formation and growth of the baby.  Follow your caregiver's instructions regarding medicine use. There are medicines that are either safe or unsafe to take during pregnancy.  Exercise only as directed by your caregiver. Experiencing uterine cramps is a good sign to stop exercising.  Continue to eat regular, healthy meals.  Wear a good support bra for breast tenderness.  Do not use hot tubs, steam rooms, or saunas.  Wear your seat belt at all times when driving.  Avoid raw meat, uncooked cheese, cat litter boxes, and soil used by cats. These carry germs that can cause birth defects in the baby.  Take your prenatal vitamins.  Try taking a stool softener (if your caregiver approves) if you develop constipation. Eat more high-fiber foods, such as fresh vegetables or fruit and whole grains. Drink plenty of fluids to keep your urine clear or pale yellow.  Take warm sitz baths to soothe any pain or discomfort caused by hemorrhoids. Use hemorrhoid cream if your caregiver approves.  If you develop varicose veins, wear support hose. Elevate your feet for 15 minutes, 3-4 times a day. Limit salt in your diet.  Avoid heavy lifting, wear low heal shoes, and practice good posture.  Rest a lot with your legs elevated if you have leg cramps or low back pain.  Visit your dentist if you have not gone during your pregnancy. Use a soft toothbrush to brush your teeth and be gentle when you floss.  A sexual relationship may be continued unless your caregiver directs you otherwise.  Do not travel far distances unless it is absolutely necessary and only with the approval  of your caregiver.  Take prenatal classes to understand, practice, and ask questions about the labor and delivery.  Make a trial run to the hospital.  Pack your hospital bag.  Prepare the baby's nursery.  Continue to go to all your prenatal visits as directed by your caregiver. SEEK MEDICAL CARE IF:  You are unsure if you are in labor or if your water has broken.  You have dizziness.  You have mild pelvic cramps, pelvic pressure, or nagging pain in your abdominal area.  You have persistent nausea, vomiting, or diarrhea.  You have a bad smelling vaginal discharge.  You have pain with urination. SEEK IMMEDIATE MEDICAL CARE IF:   You have a fever.  You are leaking fluid from your vagina.  You have spotting or bleeding from your vagina.  You have severe abdominal cramping or pain.  You have rapid weight loss or gain.  You have shortness of breath with chest pain.  You notice sudden or extreme swelling   of your face, hands, ankles, feet, or legs.  You have not felt your baby move in over an hour.  You have severe headaches that do not go away with medicine.  You have vision changes. Document Released: 04/11/2001 Document Revised: 04/22/2013 Document Reviewed: 06/18/2012 ExitCare Patient Information 2015 [employer]. This information is not intended to replace advice given to you by your health care provider. Make sure you discuss any questions you have with your health care provider.  

## 2014-04-01 NOTE — Progress Notes (Signed)
34 weeks, stable with some Braxton Hicks contractions.  She endorses good fetal movement and denies LOF, dysuria, vaginal bleeding.   She is having 4 teeth pulled later today and has already taken amoxicillin.  She needs a letter for dentist stating this is okay. She has ultrasound scheduled with MFM today.  Will eval growth/fluid.  RTC 2 weeks.

## 2014-04-15 ENCOUNTER — Encounter: Payer: Self-pay | Admitting: Physician Assistant

## 2014-04-17 ENCOUNTER — Telehealth: Payer: Self-pay | Admitting: General Practice

## 2014-04-17 ENCOUNTER — Ambulatory Visit (INDEPENDENT_AMBULATORY_CARE_PROVIDER_SITE_OTHER): Payer: Medicaid Other | Admitting: Obstetrics & Gynecology

## 2014-04-17 ENCOUNTER — Encounter: Payer: Self-pay | Admitting: Obstetrics & Gynecology

## 2014-04-17 VITALS — BP 116/65 | HR 89 | Temp 98.3°F | Wt 204.7 lb

## 2014-04-17 DIAGNOSIS — O30009 Twin pregnancy, unspecified number of placenta and unspecified number of amniotic sacs, unspecified trimester: Secondary | ICD-10-CM

## 2014-04-17 DIAGNOSIS — O3110X0 Continuing pregnancy after spontaneous abortion of one fetus or more, unspecified trimester, not applicable or unspecified: Secondary | ICD-10-CM

## 2014-04-17 DIAGNOSIS — IMO0002 Reserved for concepts with insufficient information to code with codable children: Secondary | ICD-10-CM

## 2014-04-17 LAB — POCT URINALYSIS DIP (DEVICE)
Bilirubin Urine: NEGATIVE
Glucose, UA: NEGATIVE mg/dL
Hgb urine dipstick: NEGATIVE
Ketones, ur: NEGATIVE mg/dL
Nitrite: NEGATIVE
Protein, ur: NEGATIVE mg/dL
Specific Gravity, Urine: 1.02 (ref 1.005–1.030)
Urobilinogen, UA: 0.2 mg/dL (ref 0.0–1.0)
pH: 6 (ref 5.0–8.0)

## 2014-04-17 LAB — OB RESULTS CONSOLE GC/CHLAMYDIA
Chlamydia: NEGATIVE
Gonorrhea: NEGATIVE

## 2014-04-17 LAB — OB RESULTS CONSOLE GBS: GBS: NEGATIVE

## 2014-04-17 NOTE — Telephone Encounter (Signed)
Opened in error

## 2014-04-17 NOTE — Progress Notes (Signed)
[place] no ROM or VB. [place] 12/2 reviewed. GBS CT GC done

## 2014-04-17 NOTE — Patient Instructions (Signed)
Third Trimester of Pregnancy The third trimester is from week 29 through week 42, months 7 through 9. The third trimester is a time when the fetus is growing rapidly. At the end of the ninth month, the fetus is about 20 inches in length and weighs 6-10 pounds.  BODY CHANGES Your body goes through many changes during pregnancy. The changes vary from woman to woman.   Your weight will continue to increase. You can expect to gain 25-35 pounds (11-16 kg) by the end of the pregnancy.  You may begin to get stretch marks on your hips, abdomen, and breasts.  You may urinate more often because the fetus is moving lower into your pelvis and pressing on your bladder.  You may develop or continue to have heartburn as a result of your pregnancy.  You may develop constipation because certain hormones are causing the muscles that push waste through your intestines to slow down.  You may develop hemorrhoids or swollen, bulging veins (varicose veins).  You may have pelvic pain because of the weight gain and pregnancy hormones relaxing your joints between the bones in your pelvis. Backaches may result from overexertion of the muscles supporting your posture.  You may have changes in your hair. These can include thickening of your hair, rapid growth, and changes in texture. Some women also have hair loss during or after pregnancy, or hair that feels dry or thin. Your hair will most likely return to normal after your baby is born.  Your breasts will continue to grow and be tender. A yellow discharge may leak from your breasts called colostrum.  Your belly button may stick out.  You may feel short of breath because of your expanding uterus.  You may notice the fetus "dropping," or moving lower in your abdomen.  You may have a bloody mucus discharge. This usually occurs a few days to a week before labor begins.  Your cervix becomes thin and soft (effaced) near your due date. WHAT TO EXPECT AT YOUR PRENATAL  EXAMS  You will have prenatal exams every 2 weeks until week 36. Then, you will have weekly prenatal exams. During a routine prenatal visit:  You will be weighed to make sure you and the fetus are growing normally.  Your blood pressure is taken.  Your abdomen will be measured to track your baby's growth.  The fetal heartbeat will be listened to.  Any test results from the previous visit will be discussed.  You may have a cervical check near your due date to see if you have effaced. At around 36 weeks, your caregiver will check your cervix. At the same time, your caregiver will also perform a test on the secretions of the vaginal tissue. This test is to determine if a type of bacteria, Group B streptococcus, is present. Your caregiver will explain this further. Your caregiver may ask you:  What your birth plan is.  How you are feeling.  If you are feeling the baby move.  If you have had any abnormal symptoms, such as leaking fluid, bleeding, severe headaches, or abdominal cramping.  If you have any questions. Other tests or screenings that may be performed during your third trimester include:  Blood tests that check for low iron levels (anemia).  Fetal testing to check the health, activity level, and growth of the fetus. Testing is done if you have certain medical conditions or if there are problems during the pregnancy. FALSE LABOR You may feel small, irregular contractions that   eventually go away. These are called Braxton Hicks contractions, or false labor. Contractions may last for hours, days, or even weeks before true labor sets in. If contractions come at regular intervals, intensify, or become painful, it is best to be seen by your caregiver.  SIGNS OF LABOR   Menstrual-like cramps.  Contractions that are 5 minutes apart or less.  Contractions that start on the top of the uterus and spread down to the lower abdomen and back.  A sense of increased pelvic pressure or back  pain.  A watery or bloody mucus discharge that comes from the vagina. If you have any of these signs before the 37th week of pregnancy, call your caregiver right away. You need to go to the hospital to get checked immediately. HOME CARE INSTRUCTIONS   Avoid all smoking, herbs, alcohol, and unprescribed drugs. These chemicals affect the formation and growth of the baby.  Follow your caregiver's instructions regarding medicine use. There are medicines that are either safe or unsafe to take during pregnancy.  Exercise only as directed by your caregiver. Experiencing uterine cramps is a good sign to stop exercising.  Continue to eat regular, healthy meals.  Wear a good support bra for breast tenderness.  Do not use hot tubs, steam rooms, or saunas.  Wear your seat belt at all times when driving.  Avoid raw meat, uncooked cheese, cat litter boxes, and soil used by cats. These carry germs that can cause birth defects in the baby.  Take your prenatal vitamins.  Try taking a stool softener (if your caregiver approves) if you develop constipation. Eat more high-fiber foods, such as fresh vegetables or fruit and whole grains. Drink plenty of fluids to keep your urine clear or pale yellow.  Take warm sitz baths to soothe any pain or discomfort caused by hemorrhoids. Use hemorrhoid cream if your caregiver approves.  If you develop varicose veins, wear support hose. Elevate your feet for 15 minutes, 3-4 times a day. Limit salt in your diet.  Avoid heavy lifting, wear low heal shoes, and practice good posture.  Rest a lot with your legs elevated if you have leg cramps or low back pain.  Visit your dentist if you have not gone during your pregnancy. Use a soft toothbrush to brush your teeth and be gentle when you floss.  A sexual relationship may be continued unless your caregiver directs you otherwise.  Do not travel far distances unless it is absolutely necessary and only with the approval  of your caregiver.  Take prenatal classes to understand, practice, and ask questions about the labor and delivery.  Make a trial run to the hospital.  Pack your hospital bag.  Prepare the baby's nursery.  Continue to go to all your prenatal visits as directed by your caregiver. SEEK MEDICAL CARE IF:  You are unsure if you are in labor or if your water has broken.  You have dizziness.  You have mild pelvic cramps, pelvic pressure, or nagging pain in your abdominal area.  You have persistent nausea, vomiting, or diarrhea.  You have a bad smelling vaginal discharge.  You have pain with urination. SEEK IMMEDIATE MEDICAL CARE IF:   You have a fever.  You are leaking fluid from your vagina.  You have spotting or bleeding from your vagina.  You have severe abdominal cramping or pain.  You have rapid weight loss or gain.  You have shortness of breath with chest pain.  You notice sudden or extreme swelling   of your face, hands, ankles, feet, or legs.  You have not felt your baby move in over an hour.  You have severe headaches that do not go away with medicine.  You have vision changes. Document Released: 04/11/2001 Document Revised: 04/22/2013 Document Reviewed: 06/18/2012 ExitCare Patient Information 2015 [employer]. This information is not intended to replace advice given to you by your health care provider. Make sure you discuss any questions you have with your health care provider.  

## 2014-04-18 LAB — GC/CHLAMYDIA PROBE AMP
CT Probe RNA: NEGATIVE
GC Probe RNA: NEGATIVE

## 2014-04-19 LAB — CULTURE, BETA STREP (GROUP B ONLY)

## 2014-04-20 ENCOUNTER — Telehealth: Payer: Self-pay

## 2014-04-20 NOTE — Telephone Encounter (Signed)
Pt called and stated that her cervix was checked on Friday.  When she went to the bathroom she had some spotting, "is that normal". Called pt and left message that we are returning her call to give our office a call back.

## 2014-04-21 NOTE — Telephone Encounter (Signed)
Called patient, no answer- left message stating we are trying to return your phone call, please call us back at the clinics if you still need assistance

## 2014-04-22 ENCOUNTER — Ambulatory Visit (INDEPENDENT_AMBULATORY_CARE_PROVIDER_SITE_OTHER): Payer: Medicaid Other | Admitting: Obstetrics & Gynecology

## 2014-04-22 ENCOUNTER — Encounter: Payer: Self-pay | Admitting: Obstetrics & Gynecology

## 2014-04-22 VITALS — BP 126/69 | HR 86 | Wt 206.1 lb

## 2014-04-22 DIAGNOSIS — O30009 Twin pregnancy, unspecified number of placenta and unspecified number of amniotic sacs, unspecified trimester: Secondary | ICD-10-CM

## 2014-04-22 DIAGNOSIS — O3110X0 Continuing pregnancy after spontaneous abortion of one fetus or more, unspecified trimester, not applicable or unspecified: Secondary | ICD-10-CM

## 2014-04-22 DIAGNOSIS — IMO0002 Reserved for concepts with insufficient information to code with codable children: Secondary | ICD-10-CM

## 2014-04-22 LAB — POCT URINALYSIS DIP (DEVICE)
Bilirubin Urine: NEGATIVE
Glucose, UA: NEGATIVE mg/dL
Hgb urine dipstick: NEGATIVE
Ketones, ur: NEGATIVE mg/dL
Nitrite: NEGATIVE
Protein, ur: NEGATIVE mg/dL
Specific Gravity, Urine: 1.02 (ref 1.005–1.030)
Urobilinogen, UA: 0.2 mg/dL (ref 0.0–1.0)
pH: 6 (ref 5.0–8.0)

## 2014-04-22 NOTE — Progress Notes (Signed)
Routine visit. Good FM. No problems. Labor precautions reviewed. Excellent pelvis.

## 2014-04-27 ENCOUNTER — Inpatient Hospital Stay (HOSPITAL_COMMUNITY)
Admission: AD | Admit: 2014-04-27 | Discharge: 2014-04-28 | Disposition: A | Discharge status: Home / Self Care | Payer: Medicaid Other | Source: Ambulatory Visit | Attending: Obstetrics & Gynecology | Admitting: Obstetrics & Gynecology

## 2014-04-27 ENCOUNTER — Encounter (HOSPITAL_COMMUNITY): Payer: Self-pay | Admitting: *Deleted

## 2014-04-27 DIAGNOSIS — O471 False labor at or after 37 completed weeks of gestation: Secondary | ICD-10-CM | POA: Insufficient documentation

## 2014-04-27 DIAGNOSIS — O99321 Drug use complicating pregnancy, first trimester: Secondary | ICD-10-CM

## 2014-04-27 DIAGNOSIS — O99331 Smoking (tobacco) complicating pregnancy, first trimester: Secondary | ICD-10-CM

## 2014-04-27 DIAGNOSIS — IMO0002 Reserved for concepts with insufficient information to code with codable children: Secondary | ICD-10-CM

## 2014-04-27 DIAGNOSIS — O09892 Supervision of other high risk pregnancies, second trimester: Secondary | ICD-10-CM

## 2014-04-27 DIAGNOSIS — O3480 Maternal care for other abnormalities of pelvic organs, unspecified trimester: Secondary | ICD-10-CM

## 2014-04-27 DIAGNOSIS — D279 Benign neoplasm of unspecified ovary: Secondary | ICD-10-CM

## 2014-04-27 DIAGNOSIS — Z3A38 38 weeks gestation of pregnancy: Secondary | ICD-10-CM | POA: Insufficient documentation

## 2014-04-27 DIAGNOSIS — O479 False labor, unspecified: Secondary | ICD-10-CM

## 2014-04-27 DIAGNOSIS — O09212 Supervision of pregnancy with history of pre-term labor, second trimester: Secondary | ICD-10-CM

## 2014-04-27 NOTE — MAU Note (Signed)
PT SAYS SHE STARTED  FEELING  UC  LAST NIGHT-  THEN  TODAY    CONTINUED.  DENIES HSV AND MRSA.GETS [facility]-  CLINIC-    LAST SEEN ON   WED-  VE 4  CM  . GBS-    ?

## 2014-04-28 DIAGNOSIS — O471 False labor at or after 37 completed weeks of gestation: Secondary | ICD-10-CM | POA: Diagnosis not present

## 2014-04-28 DIAGNOSIS — Z3A38 38 weeks gestation of pregnancy: Secondary | ICD-10-CM | POA: Diagnosis not present

## 2014-04-28 NOTE — Discharge Instructions (Signed)

## 2014-04-30 ENCOUNTER — Ambulatory Visit (INDEPENDENT_AMBULATORY_CARE_PROVIDER_SITE_OTHER): Payer: Medicaid Other | Admitting: Obstetrics & Gynecology

## 2014-04-30 VITALS — BP 115/69 | HR 89 | Temp 97.7°F | Wt 208.2 lb

## 2014-04-30 DIAGNOSIS — O3110X0 Continuing pregnancy after spontaneous abortion of one fetus or more, unspecified trimester, not applicable or unspecified: Secondary | ICD-10-CM

## 2014-04-30 DIAGNOSIS — IMO0002 Reserved for concepts with insufficient information to code with codable children: Secondary | ICD-10-CM

## 2014-04-30 DIAGNOSIS — O30009 Twin pregnancy, unspecified number of placenta and unspecified number of amniotic sacs, unspecified trimester: Secondary | ICD-10-CM

## 2014-04-30 LAB — POCT URINALYSIS DIP (DEVICE)
Bilirubin Urine: NEGATIVE
Glucose, UA: NEGATIVE mg/dL
Ketones, ur: NEGATIVE mg/dL
Nitrite: NEGATIVE
Protein, ur: NEGATIVE mg/dL
Specific Gravity, Urine: 1.02 (ref 1.005–1.030)
Urobilinogen, UA: 0.2 mg/dL (ref 0.0–1.0)
pH: 6 (ref 5.0–8.0)

## 2014-04-30 NOTE — Progress Notes (Signed)
Was 4 cm when checked in MAU. Some Grainger no ROM Precautions given.

## 2014-04-30 NOTE — Progress Notes (Signed)
C/o greenish vaginal discharge yesterday

## 2014-04-30 NOTE — Patient Instructions (Signed)

## 2014-05-01 ENCOUNTER — Inpatient Hospital Stay (HOSPITAL_COMMUNITY): Payer: Medicaid Other | Admitting: Anesthesiology

## 2014-05-01 ENCOUNTER — Encounter (HOSPITAL_COMMUNITY): Payer: Self-pay | Admitting: *Deleted

## 2014-05-01 ENCOUNTER — Inpatient Hospital Stay (HOSPITAL_COMMUNITY)
Admission: AD | Admit: 2014-05-01 | Discharge: 2014-05-02 | DRG: 775 | Disposition: A | Discharge status: Home / Self Care | Payer: Medicaid Other | Source: Ambulatory Visit | Attending: Obstetrics and Gynecology | Admitting: Obstetrics and Gynecology

## 2014-05-01 DIAGNOSIS — Z3483 Encounter for supervision of other normal pregnancy, third trimester: Secondary | ICD-10-CM | POA: Diagnosis present

## 2014-05-01 DIAGNOSIS — IMO0001 Reserved for inherently not codable concepts without codable children: Secondary | ICD-10-CM

## 2014-05-01 DIAGNOSIS — O09212 Supervision of pregnancy with history of pre-term labor, second trimester: Secondary | ICD-10-CM

## 2014-05-01 DIAGNOSIS — O3480 Maternal care for other abnormalities of pelvic organs, unspecified trimester: Secondary | ICD-10-CM

## 2014-05-01 DIAGNOSIS — O99324 Drug use complicating childbirth: Principal | ICD-10-CM | POA: Diagnosis present

## 2014-05-01 DIAGNOSIS — O09892 Supervision of other high risk pregnancies, second trimester: Secondary | ICD-10-CM

## 2014-05-01 DIAGNOSIS — F1210 Cannabis abuse, uncomplicated: Secondary | ICD-10-CM | POA: Diagnosis present

## 2014-05-01 DIAGNOSIS — O99331 Smoking (tobacco) complicating pregnancy, first trimester: Secondary | ICD-10-CM

## 2014-05-01 DIAGNOSIS — Z3A38 38 weeks gestation of pregnancy: Secondary | ICD-10-CM | POA: Diagnosis present

## 2014-05-01 DIAGNOSIS — O99321 Drug use complicating pregnancy, first trimester: Secondary | ICD-10-CM

## 2014-05-01 DIAGNOSIS — IMO0002 Reserved for concepts with insufficient information to code with codable children: Secondary | ICD-10-CM

## 2014-05-01 DIAGNOSIS — D279 Benign neoplasm of unspecified ovary: Secondary | ICD-10-CM

## 2014-05-01 LAB — CBC
HCT: 34.9 % — ABNORMAL LOW (ref 36.0–46.0)
Hemoglobin: 12.1 g/dL (ref 12.0–15.0)
MCH: 32.7 pg (ref 26.0–34.0)
MCHC: 34.7 g/dL (ref 30.0–36.0)
MCV: 94.3 fL (ref 78.0–100.0)
Platelets: 322 10*3/uL (ref 150–400)
RBC: 3.7 MIL/uL — ABNORMAL LOW (ref 3.87–5.11)
RDW: 12.9 % (ref 11.5–15.5)
WBC: 13 10*3/uL — ABNORMAL HIGH (ref 4.0–10.5)

## 2014-05-01 LAB — ABO/RH: ABO/RH(D): A POS

## 2014-05-01 LAB — TYPE AND SCREEN
ABO/RH(D): A POS
Antibody Screen: NEGATIVE

## 2014-05-01 MED ORDER — DIPHENHYDRAMINE HCL 50 MG/ML IJ SOLN: 12.5000 mg | INTRAMUSCULAR | Status: DC | PRN

## 2014-05-01 MED ORDER — LACTATED RINGERS IV SOLN: 500.0000 mL | INTRAVENOUS | Status: DC | PRN

## 2014-05-01 MED ORDER — PHENYLEPHRINE 40 MCG/ML (10ML) SYRINGE FOR IV PUSH (FOR BLOOD PRESSURE SUPPORT)
80.0000 ug | PREFILLED_SYRINGE | INTRAVENOUS | Status: DC | PRN
  Filled 2014-05-01: qty 10
  Filled 2014-05-01: qty 2

## 2014-05-01 MED ORDER — IBUPROFEN 600 MG PO TABS
600.0000 mg | ORAL_TABLET | Freq: Four times a day (QID) | ORAL | Status: DC
  Administered 2014-05-01 – 2014-05-02 (×4): 600 mg via ORAL
  Filled 2014-05-01 (×4): qty 1

## 2014-05-01 MED ORDER — OXYCODONE-ACETAMINOPHEN 5-325 MG PO TABS: 1.0000 | ORAL_TABLET | ORAL | Status: DC | PRN

## 2014-05-01 MED ORDER — ONDANSETRON HCL 4 MG/2ML IJ SOLN: 4.0000 mg | Freq: Four times a day (QID) | INTRAMUSCULAR | Status: DC | PRN

## 2014-05-01 MED ORDER — FENTANYL 2.5 MCG/ML BUPIVACAINE 1/10 % EPIDURAL INFUSION (WH - ANES)
14.0000 mL/h | INTRAMUSCULAR | Status: DC | PRN
  Administered 2014-05-01: 14 mL/h via EPIDURAL
  Filled 2014-05-01: qty 125

## 2014-05-01 MED ORDER — ONDANSETRON HCL 4 MG/2ML IJ SOLN: 4.0000 mg | INTRAMUSCULAR | Status: DC | PRN

## 2014-05-01 MED ORDER — SIMETHICONE 80 MG PO CHEW: 80.0000 mg | CHEWABLE_TABLET | ORAL | Status: DC | PRN

## 2014-05-01 MED ORDER — ZOLPIDEM TARTRATE 5 MG PO TABS: 5.0000 mg | ORAL_TABLET | Freq: Every evening | ORAL | Status: DC | PRN

## 2014-05-01 MED ORDER — PRENATAL MULTIVITAMIN CH
1.0000 | ORAL_TABLET | Freq: Every day | ORAL | Status: DC
  Administered 2014-05-02: 1 via ORAL
  Filled 2014-05-01: qty 1

## 2014-05-01 MED ORDER — BENZOCAINE-MENTHOL 20-0.5 % EX AERO: 1.0000 "application " | INHALATION_SPRAY | CUTANEOUS | Status: DC | PRN

## 2014-05-01 MED ORDER — PHENYLEPHRINE 40 MCG/ML (10ML) SYRINGE FOR IV PUSH (FOR BLOOD PRESSURE SUPPORT)
80.0000 ug | PREFILLED_SYRINGE | INTRAVENOUS | Status: DC | PRN
  Filled 2014-05-01: qty 2

## 2014-05-01 MED ORDER — LANOLIN HYDROUS EX OINT: TOPICAL_OINTMENT | CUTANEOUS | Status: DC | PRN

## 2014-05-01 MED ORDER — LIDOCAINE HCL (PF) 1 % IJ SOLN
30.0000 mL | INTRAMUSCULAR | Status: DC | PRN
  Filled 2014-05-01: qty 30

## 2014-05-01 MED ORDER — ACETAMINOPHEN 325 MG PO TABS: 650.0000 mg | ORAL_TABLET | ORAL | Status: DC | PRN

## 2014-05-01 MED ORDER — TETANUS-DIPHTH-ACELL PERTUSSIS 5-2.5-18.5 LF-MCG/0.5 IM SUSP: 0.5000 mL | Freq: Once | INTRAMUSCULAR | Status: DC

## 2014-05-01 MED ORDER — OXYCODONE-ACETAMINOPHEN 5-325 MG PO TABS: 2.0000 | ORAL_TABLET | ORAL | Status: DC | PRN

## 2014-05-01 MED ORDER — CITRIC ACID-SODIUM CITRATE 334-500 MG/5ML PO SOLN
30.0000 mL | ORAL | Status: DC | PRN
Start: 2014-05-01 — End: 2014-05-01

## 2014-05-01 MED ORDER — DIBUCAINE 1 % RE OINT: 1.0000 "application " | TOPICAL_OINTMENT | RECTAL | Status: DC | PRN

## 2014-05-01 MED ORDER — LACTATED RINGERS IV SOLN
INTRAVENOUS | Status: DC
  Administered 2014-05-01: 09:00:00 via INTRAVENOUS

## 2014-05-01 MED ORDER — EPHEDRINE 5 MG/ML INJ
10.0000 mg | INTRAVENOUS | Status: DC | PRN
  Filled 2014-05-01: qty 2

## 2014-05-01 MED ORDER — DIPHENHYDRAMINE HCL 25 MG PO CAPS
25.0000 mg | ORAL_CAPSULE | Freq: Four times a day (QID) | ORAL | Status: DC | PRN
Start: 2014-05-01 — End: 2014-05-02

## 2014-05-01 MED ORDER — FENTANYL 2.5 MCG/ML BUPIVACAINE 1/10 % EPIDURAL INFUSION (WH - ANES)
INTRAMUSCULAR | Status: DC | PRN
  Administered 2014-05-01: 8 mL/h via EPIDURAL

## 2014-05-01 MED ORDER — OXYTOCIN BOLUS FROM INFUSION: 500.0000 mL | INTRAVENOUS | Status: DC

## 2014-05-01 MED ORDER — SENNOSIDES-DOCUSATE SODIUM 8.6-50 MG PO TABS
2.0000 | ORAL_TABLET | ORAL | Status: DC
  Administered 2014-05-01: 2 via ORAL
  Filled 2014-05-01: qty 2

## 2014-05-01 MED ORDER — OXYTOCIN 40 UNITS IN LACTATED RINGERS INFUSION - SIMPLE MED
62.5000 mL/h | INTRAVENOUS | Status: DC
  Filled 2014-05-01: qty 1000

## 2014-05-01 MED ORDER — WITCH HAZEL-GLYCERIN EX PADS: 1.0000 "application " | MEDICATED_PAD | CUTANEOUS | Status: DC | PRN

## 2014-05-01 MED ORDER — FENTANYL CITRATE 0.05 MG/ML IJ SOLN
100.0000 ug | INTRAMUSCULAR | Status: DC | PRN
  Administered 2014-05-01: 100 ug via INTRAVENOUS
  Filled 2014-05-01: qty 2

## 2014-05-01 MED ORDER — ONDANSETRON HCL 4 MG PO TABS: 4.0000 mg | ORAL_TABLET | ORAL | Status: DC | PRN

## 2014-05-01 MED ORDER — LACTATED RINGERS IV SOLN
500.0000 mL | Freq: Once | INTRAVENOUS | Status: AC
  Administered 2014-05-01: 500 mL via INTRAVENOUS

## 2014-05-01 NOTE — MAU Note (Signed)
Pt states she has been having uc's for days, became very intense @ 0500 this a.m., feeling gushes of clear  fluid with uc's.

## 2014-05-01 NOTE — Anesthesia Preprocedure Evaluation (Signed)
Anesthesia Evaluation  Patient identified by MRN, date of birth, ID band Patient awake    Reviewed: Allergy & Precautions, H&P , NPO status , Patient's Chart, lab work & pertinent test results  Airway Mallampati: II  TM Distance: >3 FB Neck ROM: full    Dental no notable dental hx.    Pulmonary former smoker,    Pulmonary exam normal       Cardiovascular negative cardio ROS      Neuro/Psych negative neurological ROS     GI/Hepatic negative GI ROS, Neg liver ROS,   Endo/Other  negative endocrine ROS  Renal/GU negative Renal ROS     Musculoskeletal   Abdominal Normal abdominal exam  (+)   Peds  Hematology negative hematology ROS (+)   Anesthesia Other Findings   Reproductive/Obstetrics (+) Pregnancy                             Anesthesia Physical Anesthesia Plan  ASA: II  Anesthesia Plan: Epidural   Post-op Pain Management:    Induction:   Airway Management Planned:   Additional Equipment:   Intra-op Plan:   Post-operative Plan:   Informed Consent: I have reviewed the patients History and Physical, chart, labs and discussed the procedure including the risks, benefits and alternatives for the proposed anesthesia with the patient or authorized representative who has indicated his/her understanding and acceptance.     Plan Discussed with:   Anesthesia Plan Comments:         Anesthesia Quick Evaluation

## 2014-05-01 NOTE — H&P (Signed)
Jennifer Cortez is a 29 y.o. female G3T5176 @[redacted]w[redacted]d  pt of [facility] presenting for labor evaluation.  She presents with contractions becoming stronger and closer together over time and some slight leakage of clear fluid, not enough to require a pad.  She reports good fetal movement, denies vaginal bleeding, vaginal itching/burning, urinary symptoms, h/a, dizziness, n/v, or fever/chills.    Her pregnancy history is significant for twins pregnancy with early demise of one twin, smoker, positive THC during pregnancy, and previous preterm delivery x 1 with subsequent term deliveries.  No 17 P given during this or previous pregnancies.   Clinic [facility]  Dating  first trimester sono  Genetic Screen Declined  Anatomic [place] Limited at 18 weeks, need follow up at 24 weeks -> normal  GTT Early:  54             Third trimester: 81  TDaP vaccine 02/25/14  Flu vaccine 02/25/14  GBS negative  Contraception OCPs  Baby Food Breast  Pediatrician [place]  Support Person FOB   Maternal Medical History:  Reason for admission: Contractions.  Nausea.  Contractions: Onset was 1-2 hours ago.   Frequency: regular.   Duration is approximately 1 minute.   Perceived severity is strong.    Fetal activity: Perceived fetal activity is normal.   Last perceived fetal movement was within the past hour.    Prenatal complications: no prenatal complications Prenatal Complications - Diabetes: none.    OB History    Gravida Para Term Preterm AB TAB SAB Ectopic Multiple Living   7 3 0 1 3 2 1 0 0 3      Past Medical History  Diagnosis Date  . Depression   . Anxiety   . Dermoid cyst of right ovary      5 cm dermoid in right ovary -> plan laparoscopic removal after delivery.   Past Surgical History  Procedure Laterality Date  . Gall stones    . Multiple tooth extractions     Family History: family history is not on file. Social History:  reports that she quit smoking about 3 months ago. She has never used smokeless  tobacco. She reports that she uses illicit drugs (Marijuana). She reports that she does not drink alcohol.   Prenatal Transfer Tool  Maternal Diabetes: No Genetic Screening: Declined Maternal Ultrasounds/Referrals: Abnormal:  Findings:   Other: Fetal Ultrasounds or other Referrals:  None Maternal Substance Abuse:  Yes:  Type: Marijuana Significant Maternal Medications:  None Significant Maternal Lab Results:  Lab values include: Group B Strep negative Other Comments:  Demise of one twin during early pregnancy  Review of Systems  Constitutional: Negative for fever, chills and malaise/fatigue.  Eyes: Negative for blurred vision.  Respiratory: Negative for cough and shortness of breath.   Cardiovascular: Negative for chest pain.  Gastrointestinal: Positive for abdominal pain. Negative for heartburn, nausea and vomiting.  Genitourinary: Negative for dysuria, urgency and frequency.       Leakage of clear fluid   Musculoskeletal: Negative.   Neurological: Negative for dizziness and headaches.  Psychiatric/Behavioral: Negative for depression.    Dilation: 7 Effacement (%): 80 Station: -2 Exam by:: Jennifer Cortez Blood pressure 131/80, pulse 88, temperature 97.9 F (36.6 C), temperature source Oral, resp. rate 20, last menstrual period 07/30/2013.   Slide taken for ferning, negative.  SSE not performed.  Maternal Exam:  Uterine Assessment: Contraction strength is moderate.  Contraction duration is 70 seconds. Contraction frequency is regular.   Abdomen: Patient reports no  abdominal tenderness. Cervix: Cervix evaluated by digital exam.     Fetal Exam Fetal Monitor Review: Mode: ultrasound.   Baseline rate: 135.  Variability: moderate (6-25 bpm).   Pattern: accelerations present and no decelerations.    Fetal State Assessment: Category I - tracings are normal.     Physical Exam  Nursing note and vitals reviewed. Constitutional: She is oriented to person, place, and time. She  appears well-developed and well-nourished.  Neck: Normal range of motion.  Cardiovascular: Normal rate and regular rhythm.   Respiratory: Effort normal and breath sounds normal.  GI: Soft.  Musculoskeletal: Normal range of motion.  Neurological: She is alert and oriented to person, place, and time.  Skin: Skin is warm and dry.  Psychiatric: She has a normal mood and affect. Her behavior is normal. Judgment and thought content normal.    Prenatal labs: ABO, Rh: A/POS/-- (06/23 1047) Antibody: NEG (06/23 1047) Rubella: 1.02 (06/23 1047) RPR: NON REAC (10/28 1133)  HBsAg: NEGATIVE (06/23 1047)  HIV: NONREACTIVE (10/28 1133)  GBS: Negative (12/18 0000)   Assessment/Plan: [id number] @[redacted]w[redacted]d  Active labor at term ? SROM GBS negative  Admit to [facility] Fentanyl 100 mcg IV Q 1 hour PRN, may have epidural when desired Expectant management Anticipate NSVD   Jennifer Cortez, Jennifer Cortez 05/01/2014, 8:52 AM

## 2014-05-01 NOTE — Lactation Note (Signed)
This note was copied from the chart of Jennifer Lanah Mofield. Lactation Consultation Note  Patient Name: Jennifer Cortez Today's Date: 05/01/2014 Reason for consult: Initial assessment of this mom and baby at 6 hours pp .  Initial latch score=9 per RN assessment.  Mom has 3 other children and states that she breastfed the first child for 3 months, the second did not breastfeed and her third baby breastfed 1 month until she returned to work.  Mom reports that her newborn is latching well and that she is able to hand express her colostrum.  LC encouraged frequent STS and cue feedings.  Mom encouraged to feed baby 8-12 times/24 hours and with feeding cues. LC encouraged review of Baby and Me pp 9, 14 and 20-25 for STS and BF information. LC provided [employer] Resource brochure and reviewed [facility] services and list of community and web site resources.    Maternal Data Formula Feeding for Exclusion: No Has patient been taught Hand Expression?: Yes (mom informs LC that she is able to hand express her milk) Does the patient have breastfeeding experience prior to this delivery?: Yes  Feeding    LATCH Score/Interventions            Initial LATCH score=9 per RN assessment          Lactation Tools Discussed/Used   STS, cue feedings, hand expression  Consult Status Consult Status: Follow-up Date: 05/02/14 Follow-up type: In-patient    Junious Dresser [facility] 05/01/2014, 5:33 PM

## 2014-05-01 NOTE — L&D Delivery Note (Signed)
Delivery Note At 11:27 AM a viable female was delivered via Vaginal, Spontaneous Delivery (Presentation: Right Occiput Anterior).  APGAR: 9, 9; weight  Pending.   Placenta status: Intact, Spontaneous.  Cord: 3 vessels with the following complications: None.  Cord pH: NA  Anesthesia: Epidural  Episiotomy: None Lacerations: None Suture Repair: NA Est. Blood Loss (mL):  200 cc  Mom to postpartum.  Baby to Couplet care / Skin to Skin.  Mathis Bud 05/01/2014, 11:45 AM

## 2014-05-01 NOTE — Anesthesia Procedure Notes (Signed)
Epidural Patient location during procedure: OB Start time: 05/01/2014 10:00 AM End time: 05/01/2014 10:04 AM  Staffing Anesthesiologist: Lyn Hollingshead Performed by: anesthesiologist   Preanesthetic Checklist Completed: patient identified, surgical consent, pre-op evaluation, timeout performed, IV checked, risks and benefits discussed and monitors and equipment checked  Epidural Patient position: sitting Prep: site prepped and draped and DuraPrep Patient monitoring: continuous pulse ox and blood pressure Approach: midline Location: L3-L4 Injection technique: LOR air  Needle:  Needle type: Tuohy  Needle gauge: 17 G Needle length: 9 cm and 9 Needle insertion depth: 9 cm Catheter type: closed end flexible Catheter size: 19 Gauge Catheter at skin depth: 14 cm Test dose: negative and Other  Assessment Sensory level: T9 Events: blood not aspirated, injection not painful, no injection resistance, negative IV test and no paresthesia

## 2014-05-01 NOTE — Anesthesia Postprocedure Evaluation (Signed)
Anesthesia Post Note  Patient: Jennifer Cortez  Procedure(s) Performed: * No procedures listed *  Anesthesia type: Epidural  Patient location: Mother/Baby  Post pain: Pain level controlled  Post assessment: Post-op Vital signs reviewed  Last Vitals:  Filed Vitals:   05/01/14 1505  BP: 113/46  Pulse: 74  Temp: 36.7 C  Resp: 20    Post vital signs: Reviewed  Level of consciousness: awake  Complications: No apparent anesthesia complications

## 2014-05-02 MED ORDER — IBUPROFEN 600 MG PO TABS: 600.0000 mg | ORAL_TABLET | Freq: Four times a day (QID) | ORAL | Status: DC | PRN

## 2014-05-02 NOTE — Discharge Instructions (Signed)

## 2014-05-02 NOTE — Lactation Note (Addendum)
This note was copied from the chart of Jennifer Cortez. Lactation Consultation Note  Patient Name: Jennifer Cortez [id number] Date: 05/02/2014 Reason for consult: Follow-up assessment Baby 29 hours of life, seen just prior to discharge. Experienced BF mom reports baby nursing well, but she does have a small sore place on left nipple. Discussed football position with mom for a deeper latch. Baby just nurse within last hour and not willing to nurse, but assisted with positioning and mom states that she thinks this will work better that cradle position for now. Enc mom to use EBM for nipple soreness/healing and to make sure she maintains a deep latch. Mom aware of OP/BFSG and [place] phone line assistance after D/C. Referred parents to Baby and Me booklet for number of diapers to expect by day of life and EBM storage guidelines.  Maternal Data    Feeding Feeding Type: Breast Fed Length of feed: 20 min  LATCH Score/Interventions                      Lactation Tools Discussed/Used     Consult Status Consult Status: Complete    Danyell Shader 05/02/2014, 5:10 PM

## 2014-05-02 NOTE — Discharge Summary (Signed)
Obstetric Discharge Summary Reason for Admission: onset of labor Prenatal Procedures: none Intrapartum Procedures: spontaneous vaginal delivery Postpartum Procedures: none Complications-Operative and Postpartum: none HEMOGLOBIN  Date Value Ref Range Status  05/01/2014 12.1 12.0 - 15.0 g/dL Final   HCT  Date Value Ref Range Status  05/01/2014 34.9* 36.0 - 46.0 % Final   Jennifer Cortez is a 29yo [id number] who presented on the morning of 05/01/14 @ 38.5wks in active labor. Her preg has been followed by the [facility] and has been remarkable for twins pregnancy with early demise of one twin, smoker, positive THC during pregnancy, and previous preterm delivery x 1 with subsequent term deliveries. No 17 P given during this or previous pregnancies. She progressed to SVD by later that same morning. By PPD#1 she is doing well and is deemed to have received the full benefit of her hospital stay. She is breastfeeding and plans on using Depo for contraception.  Physical Exam:  General: alert, cooperative and no distress  Heart: RRR Lungs:nl effort Lochia: appropriate Uterine Fundus: firm DVT Evaluation: No evidence of DVT seen on physical exam.  Discharge Diagnoses: Term Pregnancy-delivered  Discharge Information: Date: 05/02/2014 Activity: pelvic rest Diet: routine Medications: PNV and Ibuprofen Condition: stable Instructions: refer to practice specific booklet Discharge to: home Follow-up Information    Follow up with [facility]. Schedule an appointment as soon as possible for a visit in 4 weeks.   Why:  For your postpartum appointment.   Contact information:   [place] [phone]      Newborn Data: Live born female  Birth Weight: 6 lb 7 oz (2920 g) APGAR: 28, 9  Home with mother.  Serita Grammes CNM 05/02/2014, 8:38 AM
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
## 2014-05-02 NOTE — Progress Notes (Signed)
Newborn tested positive for marijuana.  Case reported to DSS.  Spoke with [facility] who took the report and stated that newborn may return home with mother and DSS will follow up with the family in the community.  RN caring for newborn informed of above.

## 2014-05-03 LAB — HIV ANTIBODY (ROUTINE TESTING W REFLEX): HIV 1&2 Ab, 4th Generation: NONREACTIVE

## 2014-05-03 LAB — RPR

## 2014-05-04 ENCOUNTER — Encounter: Payer: Self-pay | Admitting: Obstetrics & Gynecology

## 2014-05-04 NOTE — Progress Notes (Signed)
Post discharge chart review completed.  

## 2014-05-07 ENCOUNTER — Encounter: Payer: Self-pay | Admitting: Family

## 2014-06-05 ENCOUNTER — Encounter: Payer: Self-pay | Admitting: General Practice

## 2014-06-08 ENCOUNTER — Encounter: Payer: Self-pay | Admitting: Obstetrics & Gynecology

## 2014-06-08 ENCOUNTER — Ambulatory Visit (INDEPENDENT_AMBULATORY_CARE_PROVIDER_SITE_OTHER): Payer: Medicaid Other | Admitting: Obstetrics & Gynecology

## 2014-06-08 DIAGNOSIS — D270 Benign neoplasm of right ovary: Secondary | ICD-10-CM

## 2014-06-08 DIAGNOSIS — Z01818 Encounter for other preprocedural examination: Secondary | ICD-10-CM

## 2014-06-08 DIAGNOSIS — Z3009 Encounter for other general counseling and advice on contraception: Secondary | ICD-10-CM

## 2014-06-08 MED ORDER — NORETHIN ACE-ETH ESTRAD-FE 1-20 MG-MCG(24) PO TABS: 1.0000 | ORAL_TABLET | Freq: Every day | ORAL | Status: DC

## 2014-06-08 NOTE — Patient Instructions (Signed)
Ovarian Cyst An ovarian cyst is a fluid-filled sac that forms on an ovary. The ovaries are small organs that produce eggs in women. Various types of cysts can form on the ovaries. Most are not cancerous. Many do not cause problems, and they often go away on their own. Some may cause symptoms and require treatment. Common types of ovarian cysts include:  Functional cysts--These cysts may occur every month during the menstrual cycle. This is normal. The cysts usually go away with the next menstrual cycle if the woman does not get pregnant. Usually, there are no symptoms with a functional cyst.  Endometrioma cysts--These cysts form from the tissue that lines the uterus. They are also called "chocolate cysts" because they become filled with blood that turns brown. This type of cyst can cause pain in the lower abdomen during intercourse and with your menstrual period.  Cystadenoma cysts--This type develops from the cells on the outside of the ovary. These cysts can get very big and cause lower abdomen pain and pain with intercourse. This type of cyst can twist on itself, cut off its blood supply, and cause severe pain. It can also easily rupture and cause a lot of pain.  Dermoid cysts--This type of cyst is sometimes found in both ovaries. These cysts may contain different kinds of body tissue, such as skin, teeth, hair, or cartilage. They usually do not cause symptoms unless they get very big.  Theca lutein cysts--These cysts occur when too much of a certain hormone (human chorionic gonadotropin) is produced and overstimulates the ovaries to produce an egg. This is most common after procedures used to assist with the conception of a baby (in vitro fertilization). CAUSES   Fertility drugs can cause a condition in which multiple large cysts are formed on the ovaries. This is called ovarian hyperstimulation syndrome.  A condition called polycystic ovary syndrome can cause hormonal imbalances that can lead to  nonfunctional ovarian cysts. SIGNS AND SYMPTOMS  Many ovarian cysts do not cause symptoms. If symptoms are present, they may include:  Pelvic pain or pressure.  Pain in the lower abdomen.  Pain during sexual intercourse.  Increasing girth (swelling) of the abdomen.  Abnormal menstrual periods.  Increasing pain with menstrual periods.  Stopping having menstrual periods without being pregnant. DIAGNOSIS  These cysts are commonly found during a routine or annual pelvic exam. Tests may be ordered to find out more about the cyst. These tests may include:  Ultrasound.  X-ray of the pelvis.  CT scan.  MRI.  Blood tests. TREATMENT  Many ovarian cysts go away on their own without treatment. Your health care provider may want to check your cyst regularly for 2-3 months to see if it changes. For women in menopause, it is particularly important to monitor a cyst closely because of the higher rate of ovarian cancer in menopausal women. When treatment is needed, it may include any of the following:  A procedure to drain the cyst (aspiration). This may be done using a long needle and ultrasound. It can also be done through a laparoscopic procedure. This involves using a thin, lighted tube with a tiny camera on the end (laparoscope) inserted through a small incision.  Surgery to remove the whole cyst. This may be done using laparoscopic surgery or an open surgery involving a larger incision in the lower abdomen.  Hormone treatment or birth control pills. These methods are sometimes used to help dissolve a cyst. HOME CARE INSTRUCTIONS   Only take over-the-counter   or prescription medicines as directed by your health care provider.  Follow up with your health care provider as directed.  Get regular pelvic exams and Pap tests. SEEK MEDICAL CARE IF:   Your periods are late, irregular, or painful, or they stop.  Your pelvic pain or abdominal pain does not go away.  Your abdomen becomes  larger or swollen.  You have pressure on your bladder or trouble emptying your bladder completely.  You have pain during sexual intercourse.  You have feelings of fullness, pressure, or discomfort in your stomach.  You lose weight for no apparent reason.  You feel generally ill.  You become constipated.  You lose your appetite.  You develop acne.  You have an increase in body and facial hair.  You are gaining weight, without changing your exercise and eating habits.  You think you are pregnant. SEEK IMMEDIATE MEDICAL CARE IF:   You have increasing abdominal pain.  You feel sick to your stomach (nauseous), and you throw up (vomit).  You develop a fever that comes on suddenly.  You have abdominal pain during a bowel movement.  Your menstrual periods become heavier than usual. MAKE SURE YOU:  Understand these instructions.  Will watch your condition.  Will get help right away if you are not doing well or get worse. Document Released: 04/17/2005 Document Revised: 04/22/2013 Document Reviewed: 12/23/2012 [facility] Patient Information 2015 [place], [place]. This information is not intended to replace advice given to you by your health care provider. Make sure you discuss any questions you have with your health care provider.   Ovarian Cystectomy Ovarian cystectomy is surgery to remove a fluid-filled sac (cyst) on an ovary. The ovaries are small organs that produce eggs in women. Various types of cysts can form on the ovaries. Most are not cancerous. Surgery may be done if a cyst is large or is causing symptoms such as pain. It may also be done for a cyst that is or might be cancerous. This surgery can be done using a laparoscopic technique or an open abdominal technique. The laparoscopic technique involves smaller cuts (incisions) and a faster recovery time. The technique used will depend on your age, the type of cyst, and whether the cyst is cancerous. The laparoscopic  technique is not used for a cancerous cyst. LET [facility] CARE PROVIDER KNOW ABOUT:   Any allergies you have.  All medicines you are taking, including vitamins, herbs, eye drops, creams, and over-the-counter medicines.  Previous problems you or members of your family have had with the use of anesthetics.  Any blood disorders you have.  Previous surgeries you have had.  Medical conditions you have.  Any chance you might be pregnant. RISKS AND COMPLICATIONS Generally, this is a safe procedure. However, as with any procedure, complications can occur. Possible complications include:  Excessive bleeding.  Infection.  Injury to other organs.  Blood clots.  Becoming incapable of getting pregnant (infertile). BEFORE THE PROCEDURE  Ask your health care provider about changing or stopping any regular medicines. Avoid taking aspirin, ibuprofen, or blood thinners as directed by your health care provider.  Do not eat or drink anything after midnight the night before surgery.  If you smoke, do not smoke for at least 2 weeks before your surgery.  Do not drink alcohol the day before your surgery.  Let your health care provider know if you develop a cold or any infection before your surgery.  Arrange for someone to drive you home after the  procedure or after your hospital stay. Also arrange for someone to help you with activities during recovery. PROCEDURE  Either a laparoscopic technique or an open abdominal technique may be used for this surgery.  Small monitors will be put on your body. They are used to check your heart, blood pressure, and oxygen level.   An IV access tube will be put into one of your veins. Medicine will be able to flow directly into your body through this IV tube.   You might be given a medicine to help you relax (sedative).   You will be given a medicine to make you sleep (general anesthetic). A breathing tube may be placed into your lungs during the  procedure. Laparoscopic Technique  Several small cuts (incisions) are made in your abdomen. These are typically about 1 to 2 cm long.   Your abdomen will be filled with carbon dioxide gas so that it expands. This gives the surgeon more room to operate and makes your organs easier to see.   A thin, lighted tube with a tiny camera on the end (laparoscope) is put through one of the small incisions. The camera on the laparoscope sends a picture to a TV screen in the operating room. This gives the surgeon a good view inside your abdomen.   Hollow tubes are put through the other small incisions in your abdomen. The tools needed for the procedure are put through these tubes.  The ovary with the cyst is identified, and the cyst is removed. It is sent to the lab for testing. If it is cancer, both ovaries may need to be removed during a different surgery.  Tools are removed. The incisions are then closed with stitches or skin glue, and dressings may be applied. Open Abdominal Technique  A single large incision is made along your bikini line or in the middle of your lower abdomen.  The ovary with the cyst is identified, and the cyst is removed. It is sent to the lab for testing. If it is cancer, both ovaries may need to be removed during a different surgery.  The incision is then closed with stitches or staples. AFTER THE PROCEDURE   You will wake up from anesthesia and be taken to a recovery area.  If you had laparoscopic surgery, you may be able to go home the same day, or you may need to stay in the hospital overnight.  If you had open abdominal surgery, you will need to stay in the hospital for a few days.  Your IV access tube and catheter will be removed the first or second day, after you are able to eat and drink enough.  You may be given medicine to relieve pain or to help you sleep.  You may be given an antibiotic medicine if needed. Document Released: 02/12/2007 Document Revised:  02/05/2013 Document Reviewed: 11/27/2012 [facility] Patient Information 2015 [place], [place]. This information is not intended to replace advice given to you by your health care provider. Make sure you discuss any questions you have with your health care provider.

## 2014-06-08 NOTE — Progress Notes (Signed)
Patient here today for PP visit. Desires OCPs for birth control-- reports having unprotected sex last week.

## 2014-06-08 NOTE — Progress Notes (Signed)
    Subjective:     Jennifer Cortez is a 29 y.o. [id number] female who presents for a postpartum visit. She is 5 weeks postpartum following a spontaneous vaginal delivery. I have fully reviewed the prenatal and intrapartum course; prenatal course notable for 5 cm right ovarian dermoid cyst noted on ultrasounds and the plan was for surgical removal after delivery.  She denies any pain or symptoms due to this. The delivery was at 38.5 gestational weeks. Anesthesia: epidural. Postpartum course has been uncomplicated. Baby's course has been uncomplicated. Baby is feeding by formula. Bleeding no bleeding. Bowel function is normal. Bladder function is normal. Patient is not sexually active. Contraception method is OCP (estrogen/progesterone). Postpartum depression screening: negative.  The following portions of the patient's history were reviewed and updated as appropriate: allergies, current medications, past family history, past medical history, past social history, past surgical history and problem list.  Normal pap on 10/21/13.  Review of Systems Pertinent items are noted in HPI.   Objective:    BP 99/59 mmHg  Pulse 70  Temp(Src) 97.9 F (36.6 C) (Oral)  Ht 5\' 5"  (1.651 m)  Wt 196 lb 11.2 oz (89.223 kg)  BMI 32.73 kg/m2  Breastfeeding? No  General:  alert and no distress   Breasts:  deferred  Lungs: clear to auscultation bilaterally  Heart:  regular rate and rhythm  Abdomen: soft, non-tender; bowel sounds normal; no masses,  no organomegaly  Pelvic:  deferred        Assessment:   Normal postpartum exam. Pap smear not done at today's visit.   5 cm right ovarian dermoid cyst  Plan:   1. Contraception: OCP (estrogen/progesterone) Loestrin prescribed.  Discussed LARC, possibility of Mirena placement. Patient will consider this option. It may be able to be done at same time as dermoid removal. 2. Dermoid cyst: Recommended desires surgical management with laparoscopic right ovarian  cystectomy, possible oophorectomy given size of cyst.  The risks of surgery were discussed in detail with the patient including but not limited to: bleeding which may require transfusion or reoperation; infection which may require prolonged hospitalization or re-hospitalization and antibiotic therapy; injury to bowel, bladder, ureters and major vessels or other surrounding organs; need for additional procedures including laparotomy; thromboembolic phenomenon, incisional problems and other postoperative or anesthesia complications.  Patient was told that the likelihood that her condition and symptoms will be treated effectively with this surgical management was very high; the postoperative expectations were also discussed in detail.  All questions were answered.  She was told that she will be contacted by our surgical scheduler regarding the time and date of her surgery; routine preoperative instructions of having nothing to eat or drink after midnight on the day prior to surgery and also coming to the hospital 1.5 hours prior to her time of surgery were also emphasized.  She was told she may be called for a preoperative appointment about a week prior to surgery and will be given further preoperative instructions at that visit. Printed patient education handouts about the procedure were given to the patient to review at home. 3. Follow up as needed.     Verita Schneiders, MD, [place] Attending [place] for [employer], [place]

## 2014-06-30 ENCOUNTER — Encounter (HOSPITAL_COMMUNITY): Admission: RE | Payer: Self-pay | Source: Ambulatory Visit

## 2014-06-30 ENCOUNTER — Ambulatory Visit (HOSPITAL_COMMUNITY)
Admission: RE | Admit: 2014-06-30 | Payer: Medicaid Other | Source: Ambulatory Visit | Admitting: Obstetrics & Gynecology

## 2014-06-30 SURGERY — EXCISION, CYST, OVARY, LAPAROSCOPIC
Anesthesia: Choice | Site: Abdomen | Laterality: Right

## 2014-09-22 ENCOUNTER — Telehealth: Payer: Self-pay | Admitting: *Deleted

## 2014-09-22 ENCOUNTER — Ambulatory Visit: Payer: Self-pay | Admitting: Obstetrics & Gynecology

## 2014-09-22 NOTE — Telephone Encounter (Signed)
Pt left message stating that she had her baby in January of this year and received prenatal care at our office. She currently has a rash and blisters on her buttocks. She states that she was told years ago that she tested positive for herpes but has never had an outbreak. She is concerned that her sx are an outbreak of herpes. She wants to know if she can be seen in our office or if we can call in a prescription for her. She has not seen her PCP in quite a long time. Per chart review, pt did receive prenatal care in our office and delivered her baby on 05/01/14 @ [facility]. Her documented medical history does not provide any information regarding a Dx of herpes or lab testing for such. Therefore, Rx cannot be sent in without an examination. I called pt and left a message stating that we can see her today @ 1440, she will need to arrive @ 1430. If she is not able to make this appt, please call and let us know. She would then need to be seen @ an Urgent Care facility, [facility] or any other physician of her choice.

## 2014-12-09 ENCOUNTER — Emergency Department (HOSPITAL_COMMUNITY)
Admission: EM | Admit: 2014-12-09 | Discharge: 2014-12-09 | Disposition: A | Discharge status: Home / Self Care | Payer: Medicaid Other | Source: Home / Self Care | Attending: Family Medicine | Admitting: Family Medicine

## 2014-12-09 ENCOUNTER — Encounter (HOSPITAL_COMMUNITY): Payer: Self-pay | Admitting: Emergency Medicine

## 2014-12-09 DIAGNOSIS — L989 Disorder of the skin and subcutaneous tissue, unspecified: Secondary | ICD-10-CM | POA: Diagnosis not present

## 2014-12-09 MED ORDER — ACYCLOVIR 800 MG PO TABS: 800.0000 mg | ORAL_TABLET | Freq: Two times a day (BID) | ORAL | Status: DC

## 2014-12-09 NOTE — ED Notes (Signed)
Pt states she was diagnosed with Herpes in 2012, but only had her first outbreak about 4 months ago.  She went to a clinic at work and they gave her medication but she said her insurance wouldn't cover it so she never took it and the Herpes went away.  She is here today, saying she is having another outbreak.

## 2014-12-09 NOTE — Discharge Instructions (Signed)
You likely have a herpes outbreak. We will test for the infection Please call us if you haven't heard anything in a couple of days Please take the acyclovir as prescribed. He can use ibuprofen for additional pain relief.

## 2014-12-09 NOTE — ED Provider Notes (Signed)
CSN: [id number]     Arrival date & time 12/09/14  1823 History   First MD Initiated Contact with Patient 12/09/14 1930     Chief Complaint  Patient presents with  . SEXUALLY TRANSMITTED DISEASE    herpes   (Consider location/radiation/quality/duration/timing/severity/associated sxs/prior Treatment) HPI  Skin sore in the inguinal region. Started 1 day ago. Very tender to palpation. Clear vesicles around. States that she was told the past that she has herpes but has never been tested for this outside of a "blood test which was positive. " Patient has been prescribed anti-herpes medication before but was unable to get the medicine because her insurance did not cover it. Previous outbreaks of resolve spontaneous. Current outbreak is a single lesion. Constant. Getting worse. Very tender to palpation. Clear discharge. Denies any fevers, vaginal discharge, dysuria, frequency, bone pain, back pain, rash.  Past Medical History  Diagnosis Date  . Depression   . Anxiety   . Dermoid cyst of right ovary      5 cm dermoid in right ovary -> plan laparoscopic removal after delivery.   Past Surgical History  Procedure Laterality Date  . Gall stones    . Multiple tooth extractions     History reviewed. No pertinent family history. Social History  Substance Use Topics  . Smoking status: Current Every Day Smoker -- 0.25 packs/day    Last Attempt to Quit: 01/26/2014  . Smokeless tobacco: Never Used  . Alcohol Use: Yes     Comment: social   OB History    Gravida Para Term Preterm AB TAB SAB Ectopic Multiple Living   8 4 3 1 4 2 1  0 0 1     Review of Systems Per HPI with all other pertinent systems negative.   Allergies  Review of patient's allergies indicates no known allergies.  Home Medications   Prior to Admission medications   Medication Sig Start Date End Date Taking? Authorizing Provider  acyclovir (ZOVIRAX) 800 MG tablet Take 1 tablet (800 mg total) by mouth 2 (two) times daily.  12/09/14   Waldemar Dickens, MD  ibuprofen (ADVIL,MOTRIN) 600 MG tablet Take 1 tablet (600 mg total) by mouth every 6 (six) hours as needed. Patient not taking: Reported on 06/08/2014 05/02/14   Myrtis Ser, CNM  Norethindrone Acetate-Ethinyl Estrad-FE (LOESTRIN 24 FE) 1-20 MG-MCG(24) tablet Take 1 tablet by mouth daily. 06/08/14   Osborne Oman, MD  Prenatal Vit-Fe Fumarate-FA (PRENATAL COMPLETE) 14-0.4 MG TABS Take 1 tablet by mouth daily. Patient not taking: Reported on 06/08/2014 10/21/13   Lavonia Drafts, MD   BP 101/58 mmHg  Pulse 63  Temp(Src) 98.6 F (37 C) (Oral)  Resp 18  SpO2 99% Physical Exam Physical Exam  Constitutional: oriented to person, place, and time. appears well-developed and well-nourished. No distress.  HENT:  Head: Normocephalic and atraumatic.  Eyes: EOMI. PERRL.  Neck: Normal range of motion.  Cardiovascular: RRR, no m/r/g, 2+ distal pulses,  Pulmonary/Chest: Effort normal and breath sounds normal. No respiratory distress.  Abdominal: Soft. Bowel sounds are normal. NonTTP, no distension.  Musculoskeletal: Normal range of motion. Non ttp, no effusion.  Neurological: alert and oriented to person, place, and time.  Skin: Single clear the circular lesion along the edges with with central ulceration  in the 4:00 position near the labial folds. Very tender to palpation with a Q-tip.  Psychiatric: normal mood and affect. behavior is normal. Judgment and thought content normal.   ED Course  Procedures (including  critical care time) Labs Review Labs Reviewed - No data to display  Imaging Review No results found.   MDM   1. Skin sore   Acyclovir Herpes PCR     Waldemar Dickens, MD 12/09/14 [phone]

## 2014-12-11 LAB — HERPES SIMPLEX VIRUS CULTURE: Culture: DETECTED

## 2014-12-11 NOTE — ED Notes (Signed)
Final report of herpes testing positive . Called patient, and after verifying ID, discussed positive findings. Advised to use Rx as written

## 2015-03-11 ENCOUNTER — Ambulatory Visit (INDEPENDENT_AMBULATORY_CARE_PROVIDER_SITE_OTHER): Payer: Medicaid Other | Admitting: Family Medicine

## 2015-03-11 ENCOUNTER — Encounter: Payer: Self-pay | Admitting: Family Medicine

## 2015-03-11 ENCOUNTER — Other Ambulatory Visit (HOSPITAL_COMMUNITY)
Admission: RE | Admit: 2015-03-11 | Discharge: 2015-03-11 | Disposition: A | Discharge status: Home / Self Care | Payer: Medicaid Other | Source: Ambulatory Visit | Attending: Family Medicine | Admitting: Family Medicine

## 2015-03-11 VITALS — BP 125/72 | HR 79 | Temp 98.2°F | Ht 65.5 in | Wt 166.6 lb

## 2015-03-11 DIAGNOSIS — Z113 Encounter for screening for infections with a predominantly sexual mode of transmission: Secondary | ICD-10-CM

## 2015-03-11 DIAGNOSIS — D270 Benign neoplasm of right ovary: Secondary | ICD-10-CM | POA: Diagnosis not present

## 2015-03-11 DIAGNOSIS — Z3009 Encounter for other general counseling and advice on contraception: Secondary | ICD-10-CM

## 2015-03-11 DIAGNOSIS — N898 Other specified noninflammatory disorders of vagina: Secondary | ICD-10-CM | POA: Diagnosis not present

## 2015-03-11 DIAGNOSIS — Z3202 Encounter for pregnancy test, result negative: Secondary | ICD-10-CM | POA: Diagnosis not present

## 2015-03-11 DIAGNOSIS — Z309 Encounter for contraceptive management, unspecified: Secondary | ICD-10-CM | POA: Diagnosis not present

## 2015-03-11 LAB — POCT PREGNANCY, URINE: Preg Test, Ur: NEGATIVE

## 2015-03-11 MED ORDER — NORETHIN ACE-ETH ESTRAD-FE 1-20 MG-MCG(24) PO TABS: 1.0000 | ORAL_TABLET | Freq: Every day | ORAL | Status: DC

## 2015-03-11 NOTE — Progress Notes (Signed)
   Subjective:    Patient ID: Jennifer Cortez, female    DOB: 22-Nov-1985, 29 y.o.   MRN: [id number]  HPI Patient seen for surgical consult.  During pregnancy, she had a right dermoid cyst. On [place] 03/2014 it was 5.2x4.4x4.4cm.  She was scheduled for surgery (laparoscopic) on 06/30/2014, but decided not to have surgery at that time.  She has been having some increased pain, no abnormal bleeding or spotting.  She is not currently on contraception, other than condoms.  No unprotected sex in past 2 weeks. Is having some vaginal discharge.  Review of Systems  Constitutional: Negative for fever and chills.  Respiratory: Negative for shortness of breath and wheezing.   Cardiovascular: Negative for chest pain, palpitations and leg swelling.  Gastrointestinal: Negative for nausea, vomiting, diarrhea and constipation.  All other systems reviewed and are negative.      Objective:   Physical Exam  Constitutional: She is oriented to person, place, and time. She appears well-developed and well-nourished.  HENT:  Head: Normocephalic and atraumatic.  Abdominal: Soft. Bowel sounds are normal. She exhibits no distension and no mass. There is no tenderness. There is no rebound and no guarding.  Genitourinary: There is no rash, tenderness, lesion or injury on the right labia. There is no rash, tenderness, lesion or injury on the left labia. Cervix exhibits no motion tenderness, no discharge and no friability. Right adnexum displays tenderness and fullness. Right adnexum displays no mass. Left adnexum displays no mass, no tenderness and no fullness. No erythema, tenderness or bleeding in the vagina. No foreign body around the vagina. No signs of injury around the vagina. No vaginal discharge found.  Neurological: She is alert and oriented to person, place, and time.  Skin: Skin is warm and dry.  Psychiatric: She has a normal mood and affect. Her behavior is normal. Judgment and thought content normal.        Assessment & Plan:  1. Dermoid cyst of ovary, right Will update [place] and get the patient scheduled for surgery.  Will schedule with Dr Harolyn Rutherford.  Will schedule preop appt about 2-3 weeks prior. - US Pelvis Complete; Future - US Transvaginal Non-OB; Future  2. General counseling for prescription of oral contraceptives Discussed possibility of BTL at time of surgery.  Patient to think about this and will discuss this with Dr Harolyn Rutherford at preop appt..  30 day papers signed. - Norethindrone Acetate-Ethinyl Estrad-FE (LOESTRIN 24 FE) 1-20 MG-MCG(24) tablet; Take 1 tablet by mouth daily.  Dispense: 1 Package; Refill: 11  3. Vaginal discharge  - GC/Chlamydia probe amp ([place])not at [facility] - Wet prep, genital

## 2015-03-12 ENCOUNTER — Encounter: Payer: Self-pay | Admitting: *Deleted

## 2015-03-12 ENCOUNTER — Encounter (HOSPITAL_COMMUNITY): Payer: Self-pay | Admitting: *Deleted

## 2015-03-12 ENCOUNTER — Other Ambulatory Visit: Payer: Self-pay | Admitting: Family Medicine

## 2015-03-12 LAB — GC/CHLAMYDIA PROBE AMP (~~LOC~~) NOT AT ARMC
Chlamydia: NEGATIVE
Neisseria Gonorrhea: NEGATIVE

## 2015-03-12 LAB — WET PREP, GENITAL
Trich, Wet Prep: NONE SEEN
Yeast Wet Prep HPF POC: NONE SEEN

## 2015-03-12 MED ORDER — METRONIDAZOLE 500 MG PO TABS: 500.0000 mg | ORAL_TABLET | Freq: Two times a day (BID) | ORAL | Status: DC

## 2015-03-16 ENCOUNTER — Telehealth: Payer: Self-pay | Admitting: General Practice

## 2015-03-16 NOTE — Telephone Encounter (Signed)
Patient has BV. Rx has been sent to pharmacy. Called patient with [facility] used for interpreter, no answer- left message to call us back at the clinics for non urgent results

## 2015-03-18 NOTE — Telephone Encounter (Signed)
Called Jennifer Cortez and informed her she has BV and medication already sent to her pharmacy. She states she is familiar with BV and flagyl and no questions voiced.

## 2015-04-01 ENCOUNTER — Ambulatory Visit (HOSPITAL_COMMUNITY): Payer: Medicaid Other

## 2015-04-15 ENCOUNTER — Ambulatory Visit: Payer: Self-pay | Admitting: Obstetrics & Gynecology

## 2015-04-15 ENCOUNTER — Inpatient Hospital Stay (HOSPITAL_COMMUNITY): Admission: RE | Admit: 2015-04-15 | Payer: Self-pay | Source: Ambulatory Visit

## 2015-04-15 ENCOUNTER — Encounter: Payer: Self-pay | Admitting: Obstetrics & Gynecology

## 2015-04-15 ENCOUNTER — Ambulatory Visit (HOSPITAL_COMMUNITY)
Admission: RE | Admit: 2015-04-15 | Discharge: 2015-04-15 | Disposition: A | Discharge status: Home / Self Care | Payer: Medicaid Other | Source: Ambulatory Visit | Attending: Family Medicine | Admitting: Family Medicine

## 2015-04-15 ENCOUNTER — Other Ambulatory Visit: Payer: Self-pay | Admitting: Family Medicine

## 2015-04-15 DIAGNOSIS — O283 Abnormal ultrasonic finding on antenatal screening of mother: Secondary | ICD-10-CM | POA: Insufficient documentation

## 2015-04-15 DIAGNOSIS — D270 Benign neoplasm of right ovary: Secondary | ICD-10-CM

## 2015-04-15 DIAGNOSIS — Z3A01 Less than 8 weeks gestation of pregnancy: Secondary | ICD-10-CM | POA: Insufficient documentation

## 2015-04-15 DIAGNOSIS — O3680X0 Pregnancy with inconclusive fetal viability, not applicable or unspecified: Secondary | ICD-10-CM

## 2015-04-20 ENCOUNTER — Ambulatory Visit (HOSPITAL_COMMUNITY)
Admission: RE | Admit: 2015-04-20 | Payer: Medicaid Other | Source: Ambulatory Visit | Admitting: Obstetrics & Gynecology

## 2015-04-20 ENCOUNTER — Encounter (HOSPITAL_COMMUNITY): Admission: RE | Payer: Self-pay | Source: Ambulatory Visit

## 2015-04-20 SURGERY — EXCISION, CYST, OVARY, LAPAROSCOPIC
Anesthesia: Choice | Site: Abdomen | Laterality: Right

## 2015-05-02 NOTE — L&D Delivery Note (Signed)
Delivery Note At 10:01 AM a viable female was delivered via Vaginal, Spontaneous Delivery (Presentation: Right Occiput Anterior).  APGAR: 7, 8; weight pending.   Placenta status: Intact, Spontaneous.  Cord: intact, 3 vessel.  with the following complications: none  Anesthesia: None  Episiotomy: None Lacerations: None Suture Repair: none Est. Blood Loss (mL): 150  Mom to postpartum.  Baby to Couplet care / Skin to Skin.  Jennifer Cortez is a 30 y.o. female [phone] with IUP at [redacted]w[redacted]d admitted for SOL .  She progressed without augmentation to complete and pushed less than 15 mintues to deliver.  Cord clamping delayed by several minutes then clamped by CNM and cut by pt.  Placenta intact and spontaneous, bleeding minimal.  Intact perineum.  Mom and baby stable prior to transfer to postpartum. She plans on breastfeeding.   Ralene Ok 11/08/2015, 10:19 AM   I was present for this delivery and agree with the above resident's note.  LEFTWICH-KIRBY, Jcion Buddenhagen, CNM 2:54 PM

## 2015-05-26 ENCOUNTER — Encounter: Payer: Self-pay | Admitting: Student

## 2015-05-27 ENCOUNTER — Other Ambulatory Visit (HOSPITAL_COMMUNITY)
Admission: RE | Admit: 2015-05-27 | Discharge: 2015-05-27 | Disposition: A | Discharge status: Home / Self Care | Payer: Medicaid Other | Source: Ambulatory Visit | Attending: Obstetrics and Gynecology | Admitting: Obstetrics and Gynecology

## 2015-05-27 ENCOUNTER — Encounter: Payer: Self-pay | Admitting: Obstetrics and Gynecology

## 2015-05-27 ENCOUNTER — Ambulatory Visit (INDEPENDENT_AMBULATORY_CARE_PROVIDER_SITE_OTHER): Payer: Medicaid Other | Admitting: Obstetrics and Gynecology

## 2015-05-27 VITALS — BP 115/56 | HR 63 | Temp 98.2°F | Wt 169.7 lb

## 2015-05-27 DIAGNOSIS — O3680X0 Pregnancy with inconclusive fetal viability, not applicable or unspecified: Secondary | ICD-10-CM | POA: Diagnosis not present

## 2015-05-27 DIAGNOSIS — Z36 Encounter for antenatal screening of mother: Secondary | ICD-10-CM

## 2015-05-27 DIAGNOSIS — O09212 Supervision of pregnancy with history of pre-term labor, second trimester: Secondary | ICD-10-CM | POA: Diagnosis not present

## 2015-05-27 DIAGNOSIS — O3680X1 Pregnancy with inconclusive fetal viability, fetus 1: Secondary | ICD-10-CM | POA: Diagnosis not present

## 2015-05-27 DIAGNOSIS — Z3482 Encounter for supervision of other normal pregnancy, second trimester: Secondary | ICD-10-CM

## 2015-05-27 DIAGNOSIS — O09219 Supervision of pregnancy with history of pre-term labor, unspecified trimester: Secondary | ICD-10-CM

## 2015-05-27 DIAGNOSIS — Z113 Encounter for screening for infections with a predominantly sexual mode of transmission: Secondary | ICD-10-CM | POA: Insufficient documentation

## 2015-05-27 DIAGNOSIS — O09899 Supervision of other high risk pregnancies, unspecified trimester: Secondary | ICD-10-CM | POA: Insufficient documentation

## 2015-05-27 DIAGNOSIS — O09892 Supervision of other high risk pregnancies, second trimester: Secondary | ICD-10-CM

## 2015-05-27 DIAGNOSIS — O0990 Supervision of high risk pregnancy, unspecified, unspecified trimester: Secondary | ICD-10-CM | POA: Insufficient documentation

## 2015-05-27 LAB — POCT URINALYSIS DIP (DEVICE)
Bilirubin Urine: NEGATIVE
Glucose, UA: NEGATIVE mg/dL
Hgb urine dipstick: NEGATIVE
Ketones, ur: NEGATIVE mg/dL
Leukocytes, UA: NEGATIVE
Nitrite: NEGATIVE
Protein, ur: NEGATIVE mg/dL
Specific Gravity, Urine: 1.025 (ref 1.005–1.030)
Urobilinogen, UA: 0.2 mg/dL (ref 0.0–1.0)
pH: 5.5 (ref 5.0–8.0)

## 2015-05-27 MED ORDER — DOCUSATE SODIUM 100 MG PO CAPS: 100.0000 mg | ORAL_CAPSULE | Freq: Two times a day (BID) | ORAL | Status: DC | PRN

## 2015-05-27 MED ORDER — PRENATAL VITAMINS 0.8 MG PO TABS: 1.0000 | ORAL_TABLET | Freq: Every day | ORAL | Status: DC

## 2015-05-27 NOTE — Progress Notes (Signed)
U/S scheduled 07/12/2015@7 :30 am

## 2015-05-27 NOTE — Progress Notes (Signed)
Subjective:    Jennifer Cortez is a [id number] [redacted]w[redacted]d being seen today for her first obstetrical visit.  Her obstetrical history is significant for grand multip, short interval between pregnancies, history of preterm birth. Patient does intend to breast feed. Pregnancy history fully reviewed.  Patient reports no complaints.  Filed Vitals:   05/27/15 0939  BP: 115/56  Pulse: 63  Temp: 98.2 F (36.8 C)  Weight: 169 lb 11.2 oz (76.975 kg)    HISTORY: OB History  Gravida Para Term Preterm AB SAB TAB Ectopic Multiple Living  9 4 3 1 4 1 3  0 0 4    # Outcome Date GA Lbr Len/2nd Weight Sex Delivery Anes PTL Lv  9 Current           8 TAB 10/2014          7 Term 05/01/14 [redacted]w[redacted]d 05:12 / 01:15 6 lb 7 oz (2.92 kg) F Vag-Spont EPI  Y  6 SAB 03/02/11          5 TAB 01/2010          4 Term 08/05/07 [redacted]w[redacted]d   M Vag-Spont   Y  3 TAB 05/2004          2 Term 04/26/03 [redacted]w[redacted]d   F Vag-Spont   Y  1 Preterm 09/08/01 [redacted]w[redacted]d   F Vag-Spont   Y     Past Medical History  Diagnosis Date  . Depression   . Anxiety   . Dermoid cyst of right ovary      5 cm dermoid in right ovary -> plan laparoscopic removal after delivery.  Jennifer Cortez stones   . HSV (herpes simplex virus) infection    Past Surgical History  Procedure Laterality Date  . Gall stones    . Multiple tooth extractions    . Dilation and curettage of uterus     Family History  Problem Relation Age of Onset  . Diabetes Maternal Grandmother   . Hepatitis B Maternal Grandmother      Exam    Uterus:     Pelvic Exam:    Perineum: Normal Perineum   Vulva: normal   Vagina:  normal mucosa, normal discharge   pH:    Cervix: multiparous appearance and closed and long   Adnexa: normal adnexa and no mass, fullness, tenderness   Bony Pelvis: gynecoid  System: Breast:  normal appearance, no masses or tenderness   Skin: normal coloration and turgor, no rashes    Neurologic: oriented, no focal deficits   Extremities: normal strength, tone, and  muscle mass   HEENT extra ocular movement intact   Mouth/Teeth mucous membranes moist, pharynx normal without lesions and dental hygiene good   Neck supple and no masses   Cardiovascular: regular rate and rhythm   Respiratory:  chest clear, no wheezing, crepitations, rhonchi, normal symmetric air entry   Abdomen: soft, non-tender; bowel sounds normal; no masses,  no organomegaly   Urinary:       Assessment:    Pregnancy: [id number] Patient Active Problem List   Diagnosis Date Noted  . Encounter for supervision of other normal pregnancy 05/27/2015    Priority: Medium  . Short interval between pregnancies complicating pregnancy, antepartum 05/27/2015    Priority: Medium  . History of preterm delivery, currently pregnant 05/27/2015    Priority: Medium  . Dermoid cyst of ovary 12/25/2013        Plan:     Initial labs drawn. Prenatal vitamins. Problem list  reviewed and updated. Genetic Screening discussed Quad Screen: requested.  Ultrasound discussed; fetal survey: requested.  Follow up in 4 weeks. 50% of 30 min visit spent on counseling and coordination of care.     Jennifer Cortez 05/27/2015

## 2015-05-27 NOTE — Patient Instructions (Signed)
Second Trimester of Pregnancy The second trimester is from week 13 through week 28, months 4 through 6. The second trimester is often a time when you feel your best. Your body has also adjusted to being pregnant, and you begin to feel better physically. Usually, morning sickness has lessened or quit completely, you may have more energy, and you may have an increase in appetite. The second trimester is also a time when the fetus is growing rapidly. At the end of the sixth month, the fetus is about 9 inches long and weighs about 1 pounds. You will likely begin to feel the baby move (quickening) between 18 and 20 weeks of the pregnancy. BODY CHANGES Your body goes through many changes during pregnancy. The changes vary from woman to woman.   Your weight will continue to increase. You will notice your lower abdomen bulging out.  You may begin to get stretch marks on your hips, abdomen, and breasts.  You may develop headaches that can be relieved by medicines approved by your health care provider.  You may urinate more often because the fetus is pressing on your bladder.  You may develop or continue to have heartburn as a result of your pregnancy.  You may develop constipation because certain hormones are causing the muscles that push waste through your intestines to slow down.  You may develop hemorrhoids or swollen, bulging veins (varicose veins).  You may have back pain because of the weight gain and pregnancy hormones relaxing your joints between the bones in your pelvis and as a result of a shift in weight and the muscles that support your balance.  Your breasts will continue to grow and be tender.  Your gums may bleed and may be sensitive to brushing and flossing.  Dark spots or blotches (chloasma, mask of pregnancy) may develop on your face. This will likely fade after the baby is born.  A dark line from your belly button to the pubic area (linea nigra) may appear. This will likely  fade after the baby is born.  You may have changes in your hair. These can include thickening of your hair, rapid growth, and changes in texture. Some women also have hair loss during or after pregnancy, or hair that feels dry or thin. Your hair will most likely return to normal after your baby is born. WHAT TO EXPECT AT YOUR PRENATAL VISITS During a routine prenatal visit:  You will be weighed to make sure you and the fetus are growing normally.  Your blood pressure will be taken.  Your abdomen will be measured to track your baby's growth.  The fetal heartbeat will be listened to.  Any test results from the previous visit will be discussed. Your health care provider may ask you:  How you are feeling.  If you are feeling the baby move.  If you have had any abnormal symptoms, such as leaking fluid, bleeding, severe headaches, or abdominal cramping.  If you are using any tobacco products, including cigarettes, chewing tobacco, and electronic cigarettes.  If you have any questions. Other tests that may be performed during your second trimester include:  Blood tests that check for:  Low iron levels (anemia).  Gestational diabetes (between 24 and 28 weeks).  Rh antibodies.  Urine tests to check for infections, diabetes, or protein in the urine.  An ultrasound to confirm the proper growth and development of the baby.  An amniocentesis to check for possible genetic problems.  Fetal screens for spina bifida   and Down syndrome.  HIV (human immunodeficiency virus) testing. Routine prenatal testing includes screening for HIV, unless you choose not to have this test. HOME CARE INSTRUCTIONS   Avoid all smoking, herbs, alcohol, and unprescribed drugs. These chemicals affect the formation and growth of the baby.  Do not use any tobacco products, including cigarettes, chewing tobacco, and electronic cigarettes. If you need help quitting, ask your health care provider. You may receive  counseling support and other resources to help you quit.  Follow your health care provider's instructions regarding medicine use. There are medicines that are either safe or unsafe to take during pregnancy.  Exercise only as directed by your health care provider. Experiencing uterine cramps is a good sign to stop exercising.  Continue to eat regular, healthy meals.  Wear a good support bra for breast tenderness.  Do not use hot tubs, steam rooms, or saunas.  Wear your seat belt at all times when driving.  Avoid raw meat, uncooked cheese, cat litter boxes, and soil used by cats. These carry germs that can cause birth defects in the baby.  Take your prenatal vitamins.  Take 1500-2000 mg of calcium daily starting at the 20th week of pregnancy until you deliver your baby.  Try taking a stool softener (if your health care provider approves) if you develop constipation. Eat more high-fiber foods, such as fresh vegetables or fruit and whole grains. Drink plenty of fluids to keep your urine clear or pale yellow.  Take warm sitz baths to soothe any pain or discomfort caused by hemorrhoids. Use hemorrhoid cream if your health care provider approves.  If you develop varicose veins, wear support hose. Elevate your feet for 15 minutes, 3-4 times a day. Limit salt in your diet.  Avoid heavy lifting, wear low heel shoes, and practice good posture.  Rest with your legs elevated if you have leg cramps or low back pain.  Visit your dentist if you have not gone yet during your pregnancy. Use a soft toothbrush to brush your teeth and be gentle when you floss.  A sexual relationship may be continued unless your health care provider directs you otherwise.  Continue to go to all your prenatal visits as directed by your health care provider. SEEK MEDICAL CARE IF:   You have dizziness.  You have mild pelvic cramps, pelvic pressure, or nagging pain in the abdominal area.  You have persistent nausea,  vomiting, or diarrhea.  You have a bad smelling vaginal discharge.  You have pain with urination. SEEK IMMEDIATE MEDICAL CARE IF:   You have a fever.  You are leaking fluid from your vagina.  You have spotting or bleeding from your vagina.  You have severe abdominal cramping or pain.  You have rapid weight gain or loss.  You have shortness of breath with chest pain.  You notice sudden or extreme swelling of your face, hands, ankles, feet, or legs.  You have not felt your baby move in over an hour.  You have severe headaches that do not go away with medicine.  You have vision changes.   This information is not intended to replace advice given to you by your health care provider. Make sure you discuss any questions you have with your health care provider.   Document Released: 04/11/2001 Document Revised: 05/08/2014 Document Reviewed: 06/18/2012 Elsevier Interactive Patient Education 2016 [employer].  Pregnancy and Smoking Smoking during pregnancy is unhealthy for you and your developing baby. The addictive drug nicotine, carbon monoxide, and  many other poisons are inhaled from a cigarette and carried through your bloodstream to your baby. Cigarette smoke contains more than 2,500 chemicals. It is not known which of these are harmful to a developing baby. However, both nicotine and carbon monoxide play a role in causing health problems in pregnancy. Smoking during pregnancy increases the risk of:  Birth defects in your baby, including heart defects.  Miscarriage and stillbirth.  Birth before 66 completed weeks of pregnancy (premature birth).  Pregnancy outside of the uterus (tubal pregnancy).  Attachment of the placenta over the opening of the uterus (placenta previa).  Detachment of the placenta before the baby's birth (placental abruption).  Breaking of the bag of waters before labor begins (premature rupture of membranes). HOW DOES SMOKING DURING PREGNANCY AFFECT MY  BABY? Before Birth Smoking during pregnancy:  Decreases blood flow and oxygen to your baby.  Increases the heart rate of your baby.  Slows your baby's growth in the uterus (intrauterine growth retardation). After Birth Babies born to women who smoke during pregnancy are more likely to have a low birth weight. They are also at risk for:  Serious health problems, chronic or lifelong disabilities (cerebral palsy, mental retardation, learning problems), and death.  Sudden infant death syndrome (SIDS).  Lung and breathing problems. WHAT RESOURCES ARE AVAILABLE TO HELP ME STOP SMOKING?  Ask your health care provider for help to stop smoking. The following resources are available:  Counseling.  Psychological treatment.  Acupuncture.  Family intervention.  Hypnosis.  Nicotine supplements have not been studied enough to know if they are safe to use during pregnancy. They should only be considered when all other methods fail, and if used under the close supervision of your health care provider.  Telephone QUIT lines. The national smoking cessation telephone hotline number is 1-800-QUIT NOW. FOR MORE INFORMATION  American Cancer Society: www.cancer.org  American Heart Association: www.heart.[place]: www.cancer.gov  March of Dimes: www.marchofdimes.org   This information is not intended to replace advice given to you by your health care provider. Make sure you discuss any questions you have with your health care provider.   Document Released: 08/29/2004 Document Revised: 04/22/2013 Document Reviewed: 03/17/2013 Elsevier Interactive Patient Education [employer].  Contraception Choices Contraception (birth control) is the use of any methods or devices to prevent pregnancy. Below are some methods to help avoid pregnancy. HORMONAL METHODS   Contraceptive implant. This is a thin, plastic tube containing progesterone hormone. It does not contain  estrogen hormone. Your health care provider inserts the tube in the inner part of the upper arm. The tube can remain in place for up to 3 years. After 3 years, the implant must be removed. The implant prevents the ovaries from releasing an egg (ovulation), thickens the cervical mucus to prevent sperm from entering the uterus, and thins the lining of the inside of the uterus.  Progesterone-only injections. These injections are given every 3 months by your health care provider to prevent pregnancy. This synthetic progesterone hormone stops the ovaries from releasing eggs. It also thickens cervical mucus and changes the uterine lining. This makes it harder for sperm to survive in the uterus.  Birth control pills. These pills contain estrogen and progesterone hormone. They work by preventing the ovaries from releasing eggs (ovulation). They also cause the cervical mucus to thicken, preventing the sperm from entering the uterus. Birth control pills are prescribed by a health care provider.Birth control pills can also be used to treat heavy  periods.  Minipill. This type of birth control pill contains only the progesterone hormone. They are taken every day of each month and must be prescribed by your health care provider.  Birth control patch. The patch contains hormones similar to those in birth control pills. It must be changed once a week and is prescribed by a health care provider.  Vaginal ring. The ring contains hormones similar to those in birth control pills. It is left in the vagina for 3 weeks, removed for 1 week, and then a new one is put back in place. The patient must be comfortable inserting and removing the ring from the vagina.A health care provider's prescription is necessary.  Emergency contraception. Emergency contraceptives prevent pregnancy after unprotected sexual intercourse. This pill can be taken right after sex or up to 5 days after unprotected sex. It is most effective the sooner  you take the pills after having sexual intercourse. Most emergency contraceptive pills are available without a prescription. Check with your pharmacist. Do not use emergency contraception as your only form of birth control. BARRIER METHODS   Female condom. This is a thin sheath (latex or rubber) that is worn over the penis during sexual intercourse. It can be used with spermicide to increase effectiveness.  Female condom. This is a soft, loose-fitting sheath that is put into the vagina before sexual intercourse.  Diaphragm. This is a soft, latex, dome-shaped barrier that must be fitted by a health care provider. It is inserted into the vagina, along with a spermicidal jelly. It is inserted before intercourse. The diaphragm should be left in the vagina for 6 to 8 hours after intercourse.  Cervical cap. This is a round, soft, latex or plastic cup that fits over the cervix and must be fitted by a health care provider. The cap can be left in place for up to 48 hours after intercourse.  Sponge. This is a soft, circular piece of polyurethane foam. The sponge has spermicide in it. It is inserted into the vagina after wetting it and before sexual intercourse.  Spermicides. These are chemicals that kill or block sperm from entering the cervix and uterus. They come in the form of creams, jellies, suppositories, foam, or tablets. They do not require a prescription. They are inserted into the vagina with an applicator before having sexual intercourse. The process must be repeated every time you have sexual intercourse. INTRAUTERINE CONTRACEPTION  Intrauterine device (IUD). This is a T-shaped device that is put in a woman's uterus during a menstrual period to prevent pregnancy. There are 2 types:  Copper IUD. This type of IUD is wrapped in copper wire and is placed inside the uterus. Copper makes the uterus and fallopian tubes produce a fluid that kills sperm. It can stay in place for 10 years.  Hormone IUD.  This type of IUD contains the hormone progestin (synthetic progesterone). The hormone thickens the cervical mucus and prevents sperm from entering the uterus, and it also thins the uterine lining to prevent implantation of a fertilized egg. The hormone can weaken or kill the sperm that get into the uterus. It can stay in place for 3-5 years, depending on which type of IUD is used. PERMANENT METHODS OF CONTRACEPTION  Female tubal ligation. This is when the woman's fallopian tubes are surgically sealed, tied, or blocked to prevent the egg from traveling to the uterus.  Hysteroscopic sterilization. This involves placing a small coil or insert into each fallopian tube. Your doctor uses a technique called  hysteroscopy to do the procedure. The device causes scar tissue to form. This results in permanent blockage of the fallopian tubes, so the sperm cannot fertilize the egg. It takes about 3 months after the procedure for the tubes to become blocked. You must use another form of birth control for these 3 months.  Female sterilization. This is when the female has the tubes that carry sperm tied off (vasectomy).This blocks sperm from entering the vagina during sexual intercourse. After the procedure, the man can still ejaculate fluid (semen). NATURAL PLANNING METHODS  Natural family planning. This is not having sexual intercourse or using a barrier method (condom, diaphragm, cervical cap) on days the woman could become pregnant.  Calendar method. This is keeping track of the length of each menstrual cycle and identifying when you are fertile.  Ovulation method. This is avoiding sexual intercourse during ovulation.  Symptothermal method. This is avoiding sexual intercourse during ovulation, using a thermometer and ovulation symptoms.  Post-ovulation method. This is timing sexual intercourse after you have ovulated. Regardless of which type or method of contraception you choose, it is important that you use  condoms to protect against the transmission of sexually transmitted infections (STIs). Talk with your health care provider about which form of contraception is most appropriate for you.   This information is not intended to replace advice given to you by your health care provider. Make sure you discuss any questions you have with your health care provider.   Document Released: 04/17/2005 Document Revised: 04/22/2013 Document Reviewed: 10/10/2012 Elsevier Interactive Patient Education [employer].  Breastfeeding Deciding to breastfeed is one of the best choices you can make for you and your baby. A change in hormones during pregnancy causes your breast tissue to grow and increases the number and size of your milk ducts. These hormones also allow proteins, sugars, and fats from your blood supply to make breast milk in your milk-producing glands. Hormones prevent breast milk from being released before your baby is born as well as prompt milk flow after birth. Once breastfeeding has begun, thoughts of your baby, as well as his or her sucking or crying, can stimulate the release of milk from your milk-producing glands.  BENEFITS OF BREASTFEEDING For Your Baby  Your first milk (colostrum) helps your baby's digestive system function better.  There are antibodies in your milk that help your baby fight off infections.  Your baby has a lower incidence of asthma, allergies, and sudden infant death syndrome.  The nutrients in breast milk are better for your baby than infant formulas and are designed uniquely for your baby's needs.  Breast milk improves your baby's brain development.  Your baby is less likely to develop other conditions, such as childhood obesity, asthma, or type 2 diabetes mellitus. For You  Breastfeeding helps to create a very special bond between you and your baby.  Breastfeeding is convenient. Breast milk is always available at the correct temperature and costs  nothing.  Breastfeeding helps to burn calories and helps you lose the weight gained during pregnancy.  Breastfeeding makes your uterus contract to its prepregnancy size faster and slows bleeding (lochia) after you give birth.   Breastfeeding helps to lower your risk of developing type 2 diabetes mellitus, osteoporosis, and breast or ovarian cancer later in life. SIGNS THAT YOUR BABY IS HUNGRY Early Signs of Hunger  Increased alertness or activity.  Stretching.  Movement of the head from side to side.  Movement of the head and opening of  the mouth when the corner of the mouth or cheek is stroked (rooting).  Increased sucking sounds, smacking lips, cooing, sighing, or squeaking.  Hand-to-mouth movements.  Increased sucking of fingers or hands. Late Signs of Hunger  Fussing.  Intermittent crying. Extreme Signs of Hunger Signs of extreme hunger will require calming and consoling before your baby will be able to breastfeed successfully. Do not wait for the following signs of extreme hunger to occur before you initiate breastfeeding:  Restlessness.  A loud, strong cry.  Screaming. BREASTFEEDING BASICS Breastfeeding Initiation  Find a comfortable place to sit or lie down, with your neck and back well supported.  Place a pillow or rolled up blanket under your baby to bring him or her to the level of your breast (if you are seated). Nursing pillows are specially designed to help support your arms and your baby while you breastfeed.  Make sure that your baby's abdomen is facing your abdomen.  Gently massage your breast. With your fingertips, massage from your chest wall toward your nipple in a circular motion. This encourages milk flow. You may need to continue this action during the feeding if your milk flows slowly.  Support your breast with 4 fingers underneath and your thumb above your nipple. Make sure your fingers are well away from your nipple and your baby's  mouth.  Stroke your baby's lips gently with your finger or nipple.  When your baby's mouth is open wide enough, quickly bring your baby to your breast, placing your entire nipple and as much of the colored area around your nipple (areola) as possible into your baby's mouth.  More areola should be visible above your baby's upper lip than below the lower lip.  Your baby's tongue should be between his or her lower gum and your breast.  Ensure that your baby's mouth is correctly positioned around your nipple (latched). Your baby's lips should create a seal on your breast and be turned out (everted).  It is common for your baby to suck about 2-3 minutes in order to start the flow of breast milk. Latching Teaching your baby how to latch on to your breast properly is very important. An improper latch can cause nipple pain and decreased milk supply for you and poor weight gain in your baby. Also, if your baby is not latched onto your nipple properly, he or she may swallow some air during feeding. This can make your baby fussy. Burping your baby when you switch breasts during the feeding can help to get rid of the air. However, teaching your baby to latch on properly is still the best way to prevent fussiness from swallowing air while breastfeeding. Signs that your baby has successfully latched on to your nipple:  Silent tugging or silent sucking, without causing you pain.  Swallowing heard between every 3-4 sucks.  Muscle movement above and in front of his or her ears while sucking. Signs that your baby has not successfully latched on to nipple:  Sucking sounds or smacking sounds from your baby while breastfeeding.  Nipple pain. If you think your baby has not latched on correctly, slip your finger into the corner of your baby's mouth to break the suction and place it between your baby's gums. Attempt breastfeeding initiation again. Signs of Successful Breastfeeding Signs from your baby:  A  gradual decrease in the number of sucks or complete cessation of sucking.  Falling asleep.  Relaxation of his or her body.  Retention of a small amount  of milk in his or her mouth.  Letting go of your breast by himself or herself. Signs from you:  Breasts that have increased in firmness, weight, and size 1-3 hours after feeding.  Breasts that are softer immediately after breastfeeding.  Increased milk volume, as well as a change in milk consistency and color by the fifth day of breastfeeding.  Nipples that are not sore, cracked, or bleeding. Signs That Your [employer] is Getting Enough Milk  Wetting at least 3 diapers in a 24-hour period. The urine should be clear and pale yellow by age 29 days.  At least 3 stools in a 24-hour period by age 29 days. The stool should be soft and yellow.  At least 3 stools in a 24-hour period by age 51 days. The stool should be seedy and yellow.  No loss of weight greater than 10% of birth weight during the first 77 days of age.  Average weight gain of 4-7 ounces (113-198 g) per week after age 23 days.  Consistent daily weight gain by age 33 days, without weight loss after the age of 2 weeks. After a feeding, your baby may spit up a small amount. This is common. BREASTFEEDING FREQUENCY AND DURATION Frequent feeding will help you make more milk and can prevent sore nipples and breast engorgement. Breastfeed when you feel the need to reduce the fullness of your breasts or when your baby shows signs of hunger. This is called "breastfeeding on demand." Avoid introducing a pacifier to your baby while you are working to establish breastfeeding (the first 4-6 weeks after your baby is born). After this time you may choose to use a pacifier. Research has shown that pacifier use during the first year of a baby's life decreases the risk of sudden infant death syndrome (SIDS). Allow your baby to feed on each breast as long as he or she wants. Breastfeed until your baby is  finished feeding. When your baby unlatches or falls asleep while feeding from the first breast, offer the second breast. Because newborns are often sleepy in the first few weeks of life, you may need to awaken your baby to get him or her to feed. Breastfeeding times will vary from baby to baby. However, the following rules can serve as a guide to help you ensure that your baby is properly fed:  Newborns (babies 84 weeks of age or younger) may breastfeed every 1-3 hours.  Newborns should not go longer than 3 hours during the day or 5 hours during the night without breastfeeding.  You should breastfeed your baby a minimum of 8 times in a 24-hour period until you begin to introduce solid foods to your baby at around 54 months of age. BREAST MILK PUMPING Pumping and storing breast milk allows you to ensure that your baby is exclusively fed your breast milk, even at times when you are unable to breastfeed. This is especially important if you are going back to work while you are still breastfeeding or when you are not able to be present during feedings. Your lactation consultant can give you guidelines on how long it is safe to store breast milk. A breast pump is a machine that allows you to pump milk from your breast into a sterile bottle. The pumped breast milk can then be stored in a refrigerator or freezer. Some breast pumps are operated by hand, while others use electricity. Ask your lactation consultant which type will work best for you. Breast pumps can be purchased,  but some hospitals and breastfeeding support groups lease breast pumps on a monthly basis. A lactation consultant can teach you how to hand express breast milk, if you prefer not to use a pump. CARING FOR YOUR BREASTS WHILE YOU BREASTFEED Nipples can become dry, cracked, and sore while breastfeeding. The following recommendations can help keep your breasts moisturized and healthy:  Avoid using soap on your nipples.  Wear a supportive bra.  Although not required, special nursing bras and tank tops are designed to allow access to your breasts for breastfeeding without taking off your entire bra or top. Avoid wearing underwire-style bras or extremely tight bras.  Air dry your nipples for 3-71minutes after each feeding.  Use only cotton bra pads to absorb leaked breast milk. Leaking of breast milk between feedings is normal.  Use lanolin on your nipples after breastfeeding. Lanolin helps to maintain your skin's normal moisture barrier. If you use pure lanolin, you do not need to wash it off before feeding your baby again. Pure lanolin is not toxic to your baby. You may also hand express a few drops of breast milk and gently massage that milk into your nipples and allow the milk to air dry. In the first few weeks after giving birth, some women experience extremely full breasts (engorgement). Engorgement can make your breasts feel heavy, warm, and tender to the touch. Engorgement peaks within 3-5 days after you give birth. The following recommendations can help ease engorgement:  Completely empty your breasts while breastfeeding or pumping. You may want to start by applying warm, moist heat (in the shower or with warm water-soaked hand towels) just before feeding or pumping. This increases circulation and helps the milk flow. If your baby does not completely empty your breasts while breastfeeding, pump any extra milk after he or she is finished.  Wear a snug bra (nursing or regular) or tank top for 1-2 days to signal your body to slightly decrease milk production.  Apply ice packs to your breasts, unless this is too uncomfortable for you.  Make sure that your baby is latched on and positioned properly while breastfeeding. If engorgement persists after 48 hours of following these recommendations, contact your health care provider or a Science writer. OVERALL HEALTH CARE RECOMMENDATIONS WHILE BREASTFEEDING  Eat healthy foods.  Alternate between meals and snacks, eating 3 of each per day. Because what you eat affects your breast milk, some of the foods may make your baby more irritable than usual. Avoid eating these foods if you are sure that they are negatively affecting your baby.  Drink milk, fruit juice, and water to satisfy your thirst (about 10 glasses a day).  Rest often, relax, and continue to take your prenatal vitamins to prevent fatigue, stress, and anemia.  Continue breast self-awareness checks.  Avoid chewing and smoking tobacco. Chemicals from cigarettes that pass into breast milk and exposure to secondhand smoke may harm your baby.  Avoid alcohol and drug use, including marijuana. Some medicines that may be harmful to your baby can pass through breast milk. It is important to ask your health care provider before taking any medicine, including all over-the-counter and prescription medicine as well as vitamin and herbal supplements. It is possible to become pregnant while breastfeeding. If birth control is desired, ask your health care provider about options that will be safe for your baby. SEEK MEDICAL CARE IF:  You feel like you want to stop breastfeeding or have become frustrated with breastfeeding.  You have painful breasts  or nipples.  Your nipples are cracked or bleeding.  Your breasts are red, tender, or warm.  You have a swollen area on either breast.  You have a fever or chills.  You have nausea or vomiting.  You have drainage other than breast milk from your nipples.  Your breasts do not become full before feedings by the fifth day after you give birth.  You feel sad and depressed.  Your baby is too sleepy to eat well.  Your baby is having trouble sleeping.   Your baby is wetting less than 3 diapers in a 24-hour period.  Your baby has less than 3 stools in a 24-hour period.  Your baby's skin or the white part of his or her eyes becomes yellow.   Your baby is not gaining  weight by 45 days of age. SEEK IMMEDIATE MEDICAL CARE IF:  Your baby is overly tired (lethargic) and does not want to wake up and feed.  Your baby develops an unexplained fever.   This information is not intended to replace advice given to you by your health care provider. Make sure you discuss any questions you have with your health care provider.   Document Released: 04/17/2005 Document Revised: 01/06/2015 Document Reviewed: 10/09/2012 Elsevier Interactive Patient Education [employer].

## 2015-05-27 NOTE — Progress Notes (Signed)
Initial prenatal labs today Declines flu Breastfeeding tip of the week reviewed

## 2015-05-27 NOTE — Progress Notes (Signed)
Bedside US for viability = Single IUP,  FHR - 162 bpm per PW doppler, FM present.  Dr. Elly Modena informed of results.

## 2015-05-28 ENCOUNTER — Other Ambulatory Visit: Payer: Self-pay | Admitting: Family Medicine

## 2015-05-28 DIAGNOSIS — B009 Herpesviral infection, unspecified: Secondary | ICD-10-CM

## 2015-05-28 LAB — PRENATAL PROFILE (SOLSTAS)
Antibody Screen: NEGATIVE
Basophils Absolute: 0 10*3/uL (ref 0.0–0.1)
Basophils Relative: 0 % (ref 0–1)
Eosinophils Absolute: 0.1 10*3/uL (ref 0.0–0.7)
Eosinophils Relative: 1 % (ref 0–5)
HCT: 35.5 % — ABNORMAL LOW (ref 36.0–46.0)
HIV 1&2 Ab, 4th Generation: NONREACTIVE
Hemoglobin: 11.7 g/dL — ABNORMAL LOW (ref 12.0–15.0)
Hepatitis B Surface Ag: NEGATIVE
Lymphocytes Relative: 34 % (ref 12–46)
Lymphs Abs: 1.8 10*3/uL (ref 0.7–4.0)
MCH: 31 pg (ref 26.0–34.0)
MCHC: 33 g/dL (ref 30.0–36.0)
MCV: 93.9 fL (ref 78.0–100.0)
MPV: 8.4 fL — ABNORMAL LOW (ref 8.6–12.4)
Monocytes Absolute: 0.3 10*3/uL (ref 0.1–1.0)
Monocytes Relative: 5 % (ref 3–12)
Neutro Abs: 3.1 10*3/uL (ref 1.7–7.7)
Neutrophils Relative %: 60 % (ref 43–77)
Platelets: 329 10*3/uL (ref 150–400)
RBC: 3.78 MIL/uL — ABNORMAL LOW (ref 3.87–5.11)
RDW: 13.9 % (ref 11.5–15.5)
Rh Type: POSITIVE
Rubella: 1.5 Index — ABNORMAL HIGH (ref <0.90)
WBC: 5.2 10*3/uL (ref 4.0–10.5)

## 2015-05-28 LAB — PRESCRIPTION MONITORING PROFILE (19 PANEL)
Amphetamine/Meth: NEGATIVE ng/mL
Barbiturate Screen, Urine: NEGATIVE ng/mL
Benzodiazepine Screen, Urine: NEGATIVE ng/mL
Buprenorphine, Urine: NEGATIVE ng/mL
Cannabinoid Scrn, Ur: NEGATIVE ng/mL
Carisoprodol, Urine: NEGATIVE ng/mL
Cocaine Metabolites: NEGATIVE ng/mL
Creatinine, Urine: 118.91 mg/dL (ref >20.0)
Fentanyl, Ur: NEGATIVE ng/mL
MDMA URINE: NEGATIVE ng/mL
Meperidine, Ur: NEGATIVE ng/mL
Methadone Screen, Urine: NEGATIVE ng/mL
Methaqualone: NEGATIVE ng/mL
Nitrites, Initial: NEGATIVE ug/mL
Opiate Screen, Urine: NEGATIVE ng/mL
Oxycodone Screen, Ur: NEGATIVE ng/mL
Phencyclidine, Ur: NEGATIVE ng/mL
Propoxyphene: NEGATIVE ng/mL
Tapentadol, urine: NEGATIVE ng/mL
Tramadol Scrn, Ur: NEGATIVE ng/mL
Zolpidem, Urine: NEGATIVE ng/mL
pH, Initial: 5.6 pH (ref 4.5–8.9)

## 2015-05-28 LAB — GC/CHLAMYDIA PROBE AMP (~~LOC~~) NOT AT ARMC
Chlamydia: NEGATIVE
Neisseria Gonorrhea: NEGATIVE

## 2015-05-28 MED ORDER — ACYCLOVIR 800 MG PO TABS: 800.0000 mg | ORAL_TABLET | Freq: Two times a day (BID) | ORAL | Status: DC

## 2015-05-29 LAB — CULTURE, OB URINE
Colony Count: NO GROWTH
Organism ID, Bacteria: NO GROWTH

## 2015-06-24 ENCOUNTER — Encounter: Payer: Self-pay | Admitting: Family

## 2015-06-24 ENCOUNTER — Ambulatory Visit (INDEPENDENT_AMBULATORY_CARE_PROVIDER_SITE_OTHER): Payer: Medicaid Other | Admitting: Obstetrics & Gynecology

## 2015-06-24 VITALS — BP 93/59 | HR 73 | Temp 98.4°F | Wt 177.0 lb

## 2015-06-24 DIAGNOSIS — D279 Benign neoplasm of unspecified ovary: Secondary | ICD-10-CM

## 2015-06-24 DIAGNOSIS — A6009 Herpesviral infection of other urogenital tract: Secondary | ICD-10-CM

## 2015-06-24 DIAGNOSIS — O98312 Other infections with a predominantly sexual mode of transmission complicating pregnancy, second trimester: Secondary | ICD-10-CM

## 2015-06-24 DIAGNOSIS — O09892 Supervision of other high risk pregnancies, second trimester: Secondary | ICD-10-CM

## 2015-06-24 DIAGNOSIS — O3482 Maternal care for other abnormalities of pelvic organs, second trimester: Secondary | ICD-10-CM | POA: Diagnosis present

## 2015-06-24 DIAGNOSIS — Z23 Encounter for immunization: Secondary | ICD-10-CM | POA: Diagnosis not present

## 2015-06-24 DIAGNOSIS — O09212 Supervision of pregnancy with history of pre-term labor, second trimester: Secondary | ICD-10-CM | POA: Diagnosis not present

## 2015-06-24 DIAGNOSIS — Z3482 Encounter for supervision of other normal pregnancy, second trimester: Secondary | ICD-10-CM

## 2015-06-24 LAB — POCT URINALYSIS DIP (DEVICE)
Bilirubin Urine: NEGATIVE
Glucose, UA: NEGATIVE mg/dL
Hgb urine dipstick: NEGATIVE
Ketones, ur: NEGATIVE mg/dL
Leukocytes, UA: NEGATIVE
Nitrite: NEGATIVE
Protein, ur: NEGATIVE mg/dL
Specific Gravity, Urine: >=1.03 (ref 1.005–1.030)
Urobilinogen, UA: 0.2 mg/dL (ref 0.0–1.0)
pH: 6 (ref 5.0–8.0)

## 2015-06-24 NOTE — Progress Notes (Signed)
Subjective:  Jennifer Cortez is a 30 y.o. [id number] at [redacted]w[redacted]d being seen today for ongoing prenatal care.  She is currently monitored for the following issues for this high-risk pregnancy and has Dermoid cyst of ovary; Encounter for supervision of other normal pregnancy; Short interval between pregnancies complicating pregnancy, antepartum; History of preterm delivery, currently pregnant; and Herpes on her problem list.  Patient reports no complaints.  Contractions: Not present. Vag. Bleeding: None.   . Denies leaking of fluid.   The following portions of the patient's history were reviewed and updated as appropriate: allergies, current medications, past family history, past medical history, past social history, past surgical history and problem list. Problem list updated.  Objective:   Filed Vitals:   06/24/15 0813  BP: 93/59  Pulse: 73  Temp: 98.4 F (36.9 C)  Weight: 177 lb (80.287 kg)    Fetal Status: Fetal Heart Rate (bpm): 151         General:  Alert, oriented and cooperative. Patient is in no acute distress.  Skin: Skin is warm and dry. No rash noted.   Cardiovascular: Normal heart rate noted  Respiratory: Normal respiratory effort, no problems with respiration noted  Abdomen: Soft, gravid, appropriate for gestational age. Pain/Pressure: Absent     Pelvic: Vag. Bleeding: None     Cervical exam deferred        Extremities: Normal range of motion.  Edema: None  Mental Status: Normal mood and affect. Normal behavior. Normal judgment and thought content.   Urinalysis: Urine Protein: Negative Urine Glucose: Negative  Assessment and Plan:  Pregnancy: [id number] at [redacted]w[redacted]d  1. Encounter for supervision of other normal pregnancy in second trimester H/o PTB - Flu Vaccine QUAD 36+ mos IM; Standing - Flu Vaccine QUAD 36+ mos IM  2. Short interval between pregnancies complicating pregnancy, antepartum, second trimester  - Flu Vaccine QUAD 36+ mos IM; Standing - Flu Vaccine QUAD 36+ mos  IM  Preterm labor symptoms and general obstetric precautions including but not limited to vaginal bleeding, contractions, leaking of fluid and fetal movement were reviewed in detail with the patient. Please refer to After Visit Summary for other counseling recommendations.  4 week return [place] at 18+ weeks Declined quad screen  Woodroe Mode, MD

## 2015-06-24 NOTE — Patient Instructions (Signed)

## 2015-06-24 NOTE — Progress Notes (Signed)
Patient declined quad screen Discussed BF education with patient

## 2015-07-12 ENCOUNTER — Ambulatory Visit (HOSPITAL_COMMUNITY)
Admission: RE | Admit: 2015-07-12 | Discharge: 2015-07-12 | Disposition: A | Discharge status: Home / Self Care | Payer: Medicaid Other | Source: Ambulatory Visit | Attending: Obstetrics and Gynecology | Admitting: Obstetrics and Gynecology

## 2015-07-12 DIAGNOSIS — Z3A19 19 weeks gestation of pregnancy: Secondary | ICD-10-CM | POA: Diagnosis not present

## 2015-07-12 DIAGNOSIS — Z36 Encounter for antenatal screening of mother: Secondary | ICD-10-CM | POA: Diagnosis not present

## 2015-07-12 DIAGNOSIS — Z3482 Encounter for supervision of other normal pregnancy, second trimester: Secondary | ICD-10-CM

## 2015-07-12 DIAGNOSIS — O09212 Supervision of pregnancy with history of pre-term labor, second trimester: Secondary | ICD-10-CM | POA: Diagnosis not present

## 2015-07-22 ENCOUNTER — Encounter: Payer: Self-pay | Admitting: Obstetrics & Gynecology

## 2015-08-05 ENCOUNTER — Ambulatory Visit (INDEPENDENT_AMBULATORY_CARE_PROVIDER_SITE_OTHER): Payer: Medicaid Other | Admitting: Obstetrics & Gynecology

## 2015-08-05 VITALS — BP 121/68 | HR 76 | Temp 98.0°F | Wt 190.0 lb

## 2015-08-05 DIAGNOSIS — O09892 Supervision of other high risk pregnancies, second trimester: Secondary | ICD-10-CM

## 2015-08-05 DIAGNOSIS — O3482 Maternal care for other abnormalities of pelvic organs, second trimester: Secondary | ICD-10-CM

## 2015-08-05 DIAGNOSIS — D279 Benign neoplasm of unspecified ovary: Secondary | ICD-10-CM | POA: Diagnosis not present

## 2015-08-05 DIAGNOSIS — O09212 Supervision of pregnancy with history of pre-term labor, second trimester: Secondary | ICD-10-CM

## 2015-08-05 DIAGNOSIS — O0992 Supervision of high risk pregnancy, unspecified, second trimester: Secondary | ICD-10-CM

## 2015-08-05 LAB — POCT URINALYSIS DIP (DEVICE)
Bilirubin Urine: NEGATIVE
Glucose, UA: NEGATIVE mg/dL
Hgb urine dipstick: NEGATIVE
Ketones, ur: NEGATIVE mg/dL
Leukocytes, UA: NEGATIVE
Nitrite: NEGATIVE
Protein, ur: NEGATIVE mg/dL
Specific Gravity, Urine: 1.025 (ref 1.005–1.030)
Urobilinogen, UA: 0.2 mg/dL (ref 0.0–1.0)
pH: 5 (ref 5.0–8.0)

## 2015-08-05 NOTE — Progress Notes (Signed)
Subjective:  Jennifer Cortez is a 30 y.o. [id number] at [redacted]w[redacted]d being seen today for ongoing prenatal care.  She is currently monitored for the following issues for this high-risk pregnancy and has Dermoid cyst of ovary; Supervision of high-risk pregnancy; Short interval between pregnancies complicating pregnancy, antepartum; History of preterm delivery, currently pregnant; and Herpes on her problem list.  Patient reports no complaints.  Contractions: Not present. Vag. Bleeding: None.  Movement: Present. Denies leaking of fluid.   The following portions of the patient's history were reviewed and updated as appropriate: allergies, current medications, past family history, past medical history, past social history, past surgical history and problem list. Problem list updated.  Objective:   Filed Vitals:   08/05/15 1038  BP: 121/68  Pulse: 76  Temp: 98 F (36.7 C)  Weight: 190 lb (86.183 kg)    Fetal Status: Fetal Heart Rate (bpm): 160 Fundal Height: 23 cm Movement: Present     General:  Alert, oriented and cooperative. Patient is in no acute distress.  Skin: Skin is warm and dry. No rash noted.   Cardiovascular: Normal heart rate noted  Respiratory: Normal respiratory effort, no problems with respiration noted  Abdomen: Soft, gravid, appropriate for gestational age. Pain/Pressure: Absent     Pelvic: Vag. Bleeding: None    Cervical exam deferred        Extremities: Normal range of motion.  Edema: None  Mental Status: Normal mood and affect. Normal behavior. Normal judgment and thought content.   Urinalysis: Urine Protein: Negative Urine Glucose: Negative  Assessment and Plan:  Pregnancy: [id number] at [redacted]w[redacted]d  1. History of preterm delivery, currently pregnant, second trimester Declined 17P. Stable.  Normal cervical length on recent scan.  2. Supervision of high-risk pregnancy, second trimester Preterm labor symptoms and general obstetric precautions including but not limited to vaginal  bleeding, contractions, leaking of fluid and fetal movement were reviewed in detail with the patient. Please refer to After Visit Summary for other counseling recommendations.  Return in about 4 weeks (around 09/02/2015) for OB Visit.   Osborne Oman, MD

## 2015-09-02 ENCOUNTER — Encounter: Payer: Self-pay | Admitting: Family Medicine

## 2015-09-09 ENCOUNTER — Ambulatory Visit (INDEPENDENT_AMBULATORY_CARE_PROVIDER_SITE_OTHER): Payer: Medicaid Other | Admitting: Obstetrics & Gynecology

## 2015-09-09 VITALS — BP 114/61 | HR 71 | Wt 198.5 lb

## 2015-09-09 DIAGNOSIS — D279 Benign neoplasm of unspecified ovary: Secondary | ICD-10-CM

## 2015-09-09 DIAGNOSIS — O0993 Supervision of high risk pregnancy, unspecified, third trimester: Secondary | ICD-10-CM

## 2015-09-09 DIAGNOSIS — Z23 Encounter for immunization: Secondary | ICD-10-CM | POA: Diagnosis not present

## 2015-09-09 DIAGNOSIS — O3482 Maternal care for other abnormalities of pelvic organs, second trimester: Secondary | ICD-10-CM | POA: Diagnosis not present

## 2015-09-09 DIAGNOSIS — O09212 Supervision of pregnancy with history of pre-term labor, second trimester: Secondary | ICD-10-CM | POA: Diagnosis not present

## 2015-09-09 LAB — POCT URINALYSIS DIP (DEVICE)
Bilirubin Urine: NEGATIVE
Glucose, UA: NEGATIVE mg/dL
Hgb urine dipstick: NEGATIVE
Ketones, ur: NEGATIVE mg/dL
Leukocytes, UA: NEGATIVE
Nitrite: NEGATIVE
Protein, ur: NEGATIVE mg/dL
Specific Gravity, Urine: 1.025 (ref 1.005–1.030)
Urobilinogen, UA: 0.2 mg/dL (ref 0.0–1.0)
pH: 6.5 (ref 5.0–8.0)

## 2015-09-09 LAB — CBC
HCT: 34.3 % — ABNORMAL LOW (ref 35.0–45.0)
Hemoglobin: 11.4 g/dL — ABNORMAL LOW (ref 11.7–15.5)
MCH: 31.7 pg (ref 27.0–33.0)
MCHC: 33.2 g/dL (ref 32.0–36.0)
MCV: 95.3 fL (ref 80.0–100.0)
MPV: 8.6 fL (ref 7.5–12.5)
Platelets: 325 10*3/uL (ref 140–400)
RBC: 3.6 MIL/uL — ABNORMAL LOW (ref 3.80–5.10)
RDW: 13.2 % (ref 11.0–15.0)
WBC: 6.6 10*3/uL (ref 3.8–10.8)

## 2015-09-09 MED ORDER — TETANUS-DIPHTH-ACELL PERTUSSIS 5-2.5-18.5 LF-MCG/0.5 IM SUSP
0.5000 mL | Freq: Once | INTRAMUSCULAR | Status: AC
  Administered 2015-09-09: 0.5 mL via INTRAMUSCULAR

## 2015-09-09 NOTE — Progress Notes (Signed)
Subjective:  Jennifer Cortez is a 30 y.o. [id number] at [redacted]w[redacted]d being seen today for ongoing prenatal care.  She is currently monitored for the following issues for this high-risk pregnancy and has Dermoid cyst of ovary; Supervision of high-risk pregnancy; Short interval between pregnancies complicating pregnancy, antepartum; History of preterm delivery, currently pregnant; and Herpes on her problem list.  Patient reports heartburn and occasional dizziness and seasonal allergies.  Contractions: Not present. Vag. Bleeding: None.  Movement: Present. Denies leaking of fluid.   The following portions of the patient's history were reviewed and updated as appropriate: allergies, current medications, past family history, past medical history, past social history, past surgical history and problem list. Problem list updated.  Objective:   Filed Vitals:   09/09/15 0836  BP: 114/61  Pulse: 71  Weight: 198 lb 8 oz (90.039 kg)    Fetal Status: Fetal Heart Rate (bpm): 143   Movement: Present     General:  Alert, oriented and cooperative. Patient is in no acute distress.  Skin: Skin is warm and dry. No rash noted.   Cardiovascular: Normal heart rate noted  Respiratory: Normal respiratory effort, no problems with respiration noted  Abdomen: Soft, gravid, appropriate for gestational age. Pain/Pressure: Present     Pelvic: Vag. Bleeding: None     Cervical exam deferred        Extremities: Normal range of motion.  Edema: None  Mental Status: Normal mood and affect. Normal behavior. Normal judgment and thought content.   Urinalysis: Urine Protein: Negative Urine Glucose: Negative  Assessment and Plan:  Pregnancy: [id number] at [redacted]w[redacted]d  1. Supervision of high-risk pregnancy, third trimester Patient has no acute concerns; she mentions occasional dizzy spells after rising too quickly from a seated position, denies loss of consciousness or palpitations -- she is advised to stay hydrated. Patient mentions  infrequent heartburn after certain meals but has not tried any medicines; she is advised to try TUMS for symptomatic relief but to mention at her follow-up if the problem has resolved. Patient has a history of herpes treated during flares with acyclovir, but has delivered all of her children vaginally in the past.  - Glucose Tolerance, 1 HR (50g) w/o Fasting - RPR - CBC - HIV antibody (with reflex)  Preterm labor symptoms and general obstetric precautions including but not limited to vaginal bleeding, contractions, leaking of fluid and fetal movement were reviewed in detail with the patient. Please refer to After Visit Summary for other counseling recommendations.  No Follow-up on file.   [place]ale Chard, Med Student

## 2015-09-09 NOTE — Progress Notes (Signed)
28 wk labs today  tdap vaccine today

## 2015-09-10 LAB — GLUCOSE TOLERANCE, 1 HOUR (50G) W/O FASTING: Glucose, 1 Hr, gestational: 68 mg/dL (ref <140)

## 2015-09-10 LAB — HIV ANTIBODY (ROUTINE TESTING W REFLEX): HIV 1&2 Ab, 4th Generation: NONREACTIVE

## 2015-09-10 LAB — RPR

## 2015-09-30 ENCOUNTER — Ambulatory Visit (INDEPENDENT_AMBULATORY_CARE_PROVIDER_SITE_OTHER): Payer: Medicaid Other | Admitting: Family

## 2015-09-30 VITALS — BP 105/59 | HR 83 | Wt 201.0 lb

## 2015-09-30 DIAGNOSIS — O09213 Supervision of pregnancy with history of pre-term labor, third trimester: Secondary | ICD-10-CM | POA: Diagnosis not present

## 2015-09-30 DIAGNOSIS — O26893 Other specified pregnancy related conditions, third trimester: Secondary | ICD-10-CM

## 2015-09-30 DIAGNOSIS — O09893 Supervision of other high risk pregnancies, third trimester: Secondary | ICD-10-CM

## 2015-09-30 DIAGNOSIS — B009 Herpesviral infection, unspecified: Secondary | ICD-10-CM

## 2015-09-30 DIAGNOSIS — O4703 False labor before 37 completed weeks of gestation, third trimester: Secondary | ICD-10-CM | POA: Diagnosis not present

## 2015-09-30 DIAGNOSIS — O26899 Other specified pregnancy related conditions, unspecified trimester: Secondary | ICD-10-CM

## 2015-09-30 DIAGNOSIS — N898 Other specified noninflammatory disorders of vagina: Secondary | ICD-10-CM

## 2015-09-30 DIAGNOSIS — O0993 Supervision of high risk pregnancy, unspecified, third trimester: Secondary | ICD-10-CM

## 2015-09-30 DIAGNOSIS — O4700 False labor before 37 completed weeks of gestation, unspecified trimester: Secondary | ICD-10-CM | POA: Insufficient documentation

## 2015-09-30 LAB — POCT URINALYSIS DIP (DEVICE)
Bilirubin Urine: NEGATIVE
Glucose, UA: NEGATIVE mg/dL
Ketones, ur: NEGATIVE mg/dL
Leukocytes, UA: NEGATIVE
Nitrite: NEGATIVE
Protein, ur: NEGATIVE mg/dL
Specific Gravity, Urine: 1.015 (ref 1.005–1.030)
Urobilinogen, UA: 0.2 mg/dL (ref 0.0–1.0)
pH: 5.5 (ref 5.0–8.0)

## 2015-09-30 MED ORDER — BETAMETHASONE SOD PHOS & ACET 6 (3-3) MG/ML IJ SUSP
12.0000 mg | Freq: Once | INTRAMUSCULAR | Status: AC
  Administered 2015-09-30: 12 mg via INTRAMUSCULAR

## 2015-09-30 MED ORDER — VALACYCLOVIR HCL 1 G PO TABS: 1000.0000 mg | ORAL_TABLET | Freq: Every day | ORAL | Status: DC

## 2015-09-30 NOTE — Addendum Note (Signed)
Addended by: Phillip Heal, Taylin Leder A on: 09/30/2015 10:55 AM   Modules accepted: Orders

## 2015-09-30 NOTE — Addendum Note (Signed)
Addended by: Excell Seltzer on: 09/30/2015 01:37 PM   Modules accepted: Orders

## 2015-09-30 NOTE — Addendum Note (Signed)
Addended by: Gwen Pounds on: 09/30/2015 10:13 AM   Modules accepted: Orders

## 2015-09-30 NOTE — Progress Notes (Signed)
Subjective:  Jennifer Cortez is a 30 y.o. [id number] at [redacted]w[redacted]d being seen today for ongoing prenatal care.  She is currently monitored for the following issues for this high-risk pregnancy and has Dermoid cyst of ovary; Supervision of high-risk pregnancy; Short interval between pregnancies complicating pregnancy, antepartum; History of preterm delivery, currently pregnant; and Herpes on her problem list.  Patient reports increased pelvic pressure and spotting 48 hours after intercourse.  No report of vaginal itching or discharge.  Contractions: Not present. Vag. Bleeding: Small.  Movement: Present. Denies leaking of fluid.   The following portions of the patient's history were reviewed and updated as appropriate: allergies, current medications, past family history, past medical history, past social history, past surgical history and problem list. Problem list updated.  Objective:   Filed Vitals:   09/30/15 0928  BP: 105/59  Pulse: 83  Weight: 201 lb (91.173 kg)    Fetal Status: Fetal Heart Rate (bpm): 147 Fundal Height: 31 cm Movement: Present  Presentation: Vertex  General:  Alert, oriented and cooperative. Patient is in no acute distress.  Skin: Skin is warm and dry. No rash noted.   Cardiovascular: Normal heart rate noted  Respiratory: Normal respiratory effort, no problems with respiration noted  Abdomen: Soft, gravid, appropriate for gestational age. Pain/Pressure: Present     Pelvic: Vag. Bleeding: Small     Cervical exam performed Dilation: 2.5 Effacement (%): 30    Extremities: Normal range of motion.  Edema: None  Mental Status: Normal mood and affect. Normal behavior. Normal judgment and thought content.   Urinalysis: Urine Protein: Negative Urine Glucose: Negative  Assessment and Plan:  Pregnancy: [id number] at [redacted]w[redacted]d  1. Supervision of high-risk pregnancy, third trimester - Close monitoring  2. Herpes - Begin prophylaxis Valtrex  3. History of preterm delivery, currently  pregnant, third trimester - BMZ today, repeat in 24 hours - Close observation - Consulted with Dr. Elly Modena > agrees with plan of care  4.  Vaginal Discharge - Wet prep  Preterm labor symptoms and general obstetric precautions including but not limited to vaginal bleeding, contractions, leaking of fluid and fetal movement were reviewed in detail with the patient. Please refer to After Visit Summary for other counseling recommendations.  Return in about 2 weeks (around 10/14/2015).   Venia Carbon Michiel Cowboy, CNM

## 2015-10-01 ENCOUNTER — Ambulatory Visit (INDEPENDENT_AMBULATORY_CARE_PROVIDER_SITE_OTHER): Payer: Medicaid Other | Admitting: *Deleted

## 2015-10-01 VITALS — BP 114/60 | HR 73

## 2015-10-01 DIAGNOSIS — Z3A30 30 weeks gestation of pregnancy: Secondary | ICD-10-CM

## 2015-10-01 DIAGNOSIS — O4703 False labor before 37 completed weeks of gestation, third trimester: Secondary | ICD-10-CM

## 2015-10-01 DIAGNOSIS — O4700 False labor before 37 completed weeks of gestation, unspecified trimester: Secondary | ICD-10-CM

## 2015-10-01 LAB — WET PREP, GENITAL
Trich, Wet Prep: NONE SEEN
WBC, Wet Prep HPF POC: NONE SEEN
Yeast Wet Prep HPF POC: NONE SEEN

## 2015-10-01 MED ORDER — BETAMETHASONE SOD PHOS & ACET 6 (3-3) MG/ML IJ SUSP
12.0000 mg | Freq: Once | INTRAMUSCULAR | Status: AC
  Administered 2015-10-01: 12 mg via INTRAMUSCULAR

## 2015-10-03 ENCOUNTER — Encounter: Payer: Self-pay | Admitting: Family

## 2015-10-03 ENCOUNTER — Other Ambulatory Visit: Payer: Self-pay | Admitting: Family

## 2015-10-03 DIAGNOSIS — N760 Acute vaginitis: Secondary | ICD-10-CM

## 2015-10-03 DIAGNOSIS — B9689 Other specified bacterial agents as the cause of diseases classified elsewhere: Secondary | ICD-10-CM

## 2015-10-03 MED ORDER — METRONIDAZOLE 500 MG PO TABS: 500.0000 mg | ORAL_TABLET | Freq: Two times a day (BID) | ORAL | Status: DC

## 2015-10-04 ENCOUNTER — Telehealth: Payer: Self-pay

## 2015-10-04 NOTE — Telephone Encounter (Signed)
Per [id number] pt has BV and Rx has been sent to her pharmacy.   Called pt and LM that a medication has been sent your pharmacy.  If she has any questions please give the office a call.

## 2015-10-14 ENCOUNTER — Encounter: Payer: Self-pay | Admitting: Family Medicine

## 2015-10-14 ENCOUNTER — Ambulatory Visit (INDEPENDENT_AMBULATORY_CARE_PROVIDER_SITE_OTHER): Payer: Medicaid Other | Admitting: Family Medicine

## 2015-10-14 VITALS — BP 95/53 | HR 75 | Wt 202.4 lb

## 2015-10-14 DIAGNOSIS — O4700 False labor before 37 completed weeks of gestation, unspecified trimester: Secondary | ICD-10-CM

## 2015-10-14 DIAGNOSIS — O0993 Supervision of high risk pregnancy, unspecified, third trimester: Secondary | ICD-10-CM

## 2015-10-14 DIAGNOSIS — O4703 False labor before 37 completed weeks of gestation, third trimester: Secondary | ICD-10-CM

## 2015-10-14 LAB — POCT URINALYSIS DIP (DEVICE)
Bilirubin Urine: NEGATIVE
Glucose, UA: NEGATIVE mg/dL
Hgb urine dipstick: NEGATIVE
Ketones, ur: NEGATIVE mg/dL
Leukocytes, UA: NEGATIVE
Nitrite: NEGATIVE
Protein, ur: NEGATIVE mg/dL
Specific Gravity, Urine: 1.02 (ref 1.005–1.030)
Urobilinogen, UA: 0.2 mg/dL (ref 0.0–1.0)
pH: 5.5 (ref 5.0–8.0)

## 2015-10-14 NOTE — Telephone Encounter (Signed)
Pt here today medication pickup addressed.

## 2015-10-14 NOTE — Patient Instructions (Signed)
Breastfeeding Deciding to breastfeed is one of the best choices you can make for you and your baby. A change in hormones during pregnancy causes your breast tissue to grow and increases the number and size of your milk ducts. These hormones also allow proteins, sugars, and fats from your blood supply to make breast milk in your milk-producing glands. Hormones prevent breast milk from being released before your baby is born as well as prompt milk flow after birth. Once breastfeeding has begun, thoughts of your baby, as well as his or her sucking or crying, can stimulate the release of milk from your milk-producing glands.  BENEFITS OF BREASTFEEDING For Your Baby  Your first milk (colostrum) helps your baby's digestive system function better.  There are antibodies in your milk that help your baby fight off infections.  Your baby has a lower incidence of asthma, allergies, and sudden infant death syndrome.  The nutrients in breast milk are better for your baby than infant formulas and are designed uniquely for your baby's needs.  Breast milk improves your baby's brain development.  Your baby is less likely to develop other conditions, such as childhood obesity, asthma, or type 2 diabetes mellitus. For You  Breastfeeding helps to create a very special bond between you and your baby.  Breastfeeding is convenient. Breast milk is always available at the correct temperature and costs nothing.  Breastfeeding helps to burn calories and helps you lose the weight gained during pregnancy.  Breastfeeding makes your uterus contract to its prepregnancy size faster and slows bleeding (lochia) after you give birth.   Breastfeeding helps to lower your risk of developing type 2 diabetes mellitus, osteoporosis, and breast or ovarian cancer later in life. SIGNS THAT YOUR BABY IS HUNGRY Early Signs of Hunger  Increased alertness or activity.  Stretching.  Movement of the head from side to  side.  Movement of the head and opening of the mouth when the corner of the mouth or cheek is stroked (rooting).  Increased sucking sounds, smacking lips, cooing, sighing, or squeaking.  Hand-to-mouth movements.  Increased sucking of fingers or hands. Late Signs of Hunger  Fussing.  Intermittent crying. Extreme Signs of Hunger Signs of extreme hunger will require calming and consoling before your baby will be able to breastfeed successfully. Do not wait for the following signs of extreme hunger to occur before you initiate breastfeeding:  Restlessness.  A loud, strong cry.  Screaming. BREASTFEEDING BASICS Breastfeeding Initiation  Find a comfortable place to sit or lie down, with your neck and back well supported.  Place a pillow or rolled up blanket under your baby to bring him or her to the level of your breast (if you are seated). Nursing pillows are specially designed to help support your arms and your baby while you breastfeed.  Make sure that your baby's abdomen is facing your abdomen.  Gently massage your breast. With your fingertips, massage from your chest wall toward your nipple in a circular motion. This encourages milk flow. You may need to continue this action during the feeding if your milk flows slowly.  Support your breast with 4 fingers underneath and your thumb above your nipple. Make sure your fingers are well away from your nipple and your baby's mouth.  Stroke your baby's lips gently with your finger or nipple.  When your baby's mouth is open wide enough, quickly bring your baby to your breast, placing your entire nipple and as much of the colored area around your nipple (  areola) as possible into your baby's mouth.  More areola should be visible above your baby's upper lip than below the lower lip.  Your baby's tongue should be between his or her lower gum and your breast.  Ensure that your baby's mouth is correctly positioned around your nipple  (latched). Your baby's lips should create a seal on your breast and be turned out (everted).  It is common for your baby to suck about 2-3 minutes in order to start the flow of breast milk. Latching Teaching your baby how to latch on to your breast properly is very important. An improper latch can cause nipple pain and decreased milk supply for you and poor weight gain in your baby. Also, if your baby is not latched onto your nipple properly, he or she may swallow some air during feeding. This can make your baby fussy. Burping your baby when you switch breasts during the feeding can help to get rid of the air. However, teaching your baby to latch on properly is still the best way to prevent fussiness from swallowing air while breastfeeding. Signs that your baby has successfully latched on to your nipple:  Silent tugging or silent sucking, without causing you pain.  Swallowing heard between every 3-4 sucks.  Muscle movement above and in front of his or her ears while sucking. Signs that your baby has not successfully latched on to nipple:  Sucking sounds or smacking sounds from your baby while breastfeeding.  Nipple pain. If you think your baby has not latched on correctly, slip your finger into the corner of your baby's mouth to break the suction and place it between your baby's gums. Attempt breastfeeding initiation again. Signs of Successful Breastfeeding Signs from your baby:  A gradual decrease in the number of sucks or complete cessation of sucking.  Falling asleep.  Relaxation of his or her body.  Retention of a small amount of milk in his or her mouth.  Letting go of your breast by himself or herself. Signs from you:  Breasts that have increased in firmness, weight, and size 1-3 hours after feeding.  Breasts that are softer immediately after breastfeeding.  Increased milk volume, as well as a change in milk consistency and color by the fifth day of breastfeeding.  Nipples  that are not sore, cracked, or bleeding. Signs That Your Baby is Getting Enough Milk  Wetting at least 3 diapers in a 24-hour period. The urine should be clear and pale yellow by age 5 days.  At least 3 stools in a 24-hour period by age 5 days. The stool should be soft and yellow.  At least 3 stools in a 24-hour period by age 7 days. The stool should be seedy and yellow.  No loss of weight greater than 10% of birth weight during the first 3 days of age.  Average weight gain of 4-7 ounces (113-198 g) per week after age 4 days.  Consistent daily weight gain by age 5 days, without weight loss after the age of 2 weeks. After a feeding, your baby may spit up a small amount. This is common. BREASTFEEDING FREQUENCY AND DURATION Frequent feeding will help you make more milk and can prevent sore nipples and breast engorgement. Breastfeed when you feel the need to reduce the fullness of your breasts or when your baby shows signs of hunger. This is called "breastfeeding on demand." Avoid introducing a pacifier to your baby while you are working to establish breastfeeding (the first 4-6 weeks   after your baby is born). After this time you may choose to use a pacifier. Research has shown that pacifier use during the first year of a baby's life decreases the risk of sudden infant death syndrome (SIDS). Allow your baby to feed on each breast as long as he or she wants. Breastfeed until your baby is finished feeding. When your baby unlatches or falls asleep while feeding from the first breast, offer the second breast. Because newborns are often sleepy in the first few weeks of life, you may need to awaken your baby to get him or her to feed. Breastfeeding times will vary from baby to baby. However, the following rules can serve as a guide to help you ensure that your baby is properly fed:  Newborns (babies 4 weeks of age or younger) may breastfeed every 1-3 hours.  Newborns should not go longer than 3 hours  during the day or 5 hours during the night without breastfeeding.  You should breastfeed your baby a minimum of 8 times in a 24-hour period until you begin to introduce solid foods to your baby at around 6 months of age. BREAST MILK PUMPING Pumping and storing breast milk allows you to ensure that your baby is exclusively fed your breast milk, even at times when you are unable to breastfeed. This is especially important if you are going back to work while you are still breastfeeding or when you are not able to be present during feedings. Your lactation consultant can give you guidelines on how long it is safe to store breast milk. A breast pump is a machine that allows you to pump milk from your breast into a sterile bottle. The pumped breast milk can then be stored in a refrigerator or freezer. Some breast pumps are operated by hand, while others use electricity. Ask your lactation consultant which type will work best for you. Breast pumps can be purchased, but some hospitals and breastfeeding support groups lease breast pumps on a monthly basis. A lactation consultant can teach you how to hand express breast milk, if you prefer not to use a pump. CARING FOR YOUR BREASTS WHILE YOU BREASTFEED Nipples can become dry, cracked, and sore while breastfeeding. The following recommendations can help keep your breasts moisturized and healthy:  Avoid using soap on your nipples.  Wear a supportive bra. Although not required, special nursing bras and tank tops are designed to allow access to your breasts for breastfeeding without taking off your entire bra or top. Avoid wearing underwire-style bras or extremely tight bras.  Air dry your nipples for 3-4minutes after each feeding.  Use only cotton bra pads to absorb leaked breast milk. Leaking of breast milk between feedings is normal.  Use lanolin on your nipples after breastfeeding. Lanolin helps to maintain your skin's normal moisture barrier. If you use  pure lanolin, you do not need to wash it off before feeding your baby again. Pure lanolin is not toxic to your baby. You may also hand express a few drops of breast milk and gently massage that milk into your nipples and allow the milk to air dry. In the first few weeks after giving birth, some women experience extremely full breasts (engorgement). Engorgement can make your breasts feel heavy, warm, and tender to the touch. Engorgement peaks within 3-5 days after you give birth. The following recommendations can help ease engorgement:  Completely empty your breasts while breastfeeding or pumping. You may want to start by applying warm, moist heat (in   the shower or with warm water-soaked hand towels) just before feeding or pumping. This increases circulation and helps the milk flow. If your baby does not completely empty your breasts while breastfeeding, pump any extra milk after he or she is finished.  Wear a snug bra (nursing or regular) or tank top for 1-2 days to signal your body to slightly decrease milk production.  Apply ice packs to your breasts, unless this is too uncomfortable for you.  Make sure that your baby is latched on and positioned properly while breastfeeding. If engorgement persists after 48 hours of following these recommendations, contact your health care provider or a Science writer. OVERALL HEALTH CARE RECOMMENDATIONS WHILE BREASTFEEDING  Eat healthy foods. Alternate between meals and snacks, eating 3 of each per day. Because what you eat affects your breast milk, some of the foods may make your baby more irritable than usual. Avoid eating these foods if you are sure that they are negatively affecting your baby.  Drink milk, fruit juice, and water to satisfy your thirst (about 10 glasses a day).  Rest often, relax, and continue to take your prenatal vitamins to prevent fatigue, stress, and anemia.  Continue breast self-awareness checks.  Avoid chewing and smoking  tobacco. Chemicals from cigarettes that pass into breast milk and exposure to secondhand smoke may harm your baby.  Avoid alcohol and drug use, including marijuana. Some medicines that may be harmful to your baby can pass through breast milk. It is important to ask your health care provider before taking any medicine, including all over-the-counter and prescription medicine as well as vitamin and herbal supplements. It is possible to become pregnant while breastfeeding. If birth control is desired, ask your health care provider about options that will be safe for your baby. SEEK MEDICAL CARE IF:  You feel like you want to stop breastfeeding or have become frustrated with breastfeeding.  You have painful breasts or nipples.  Your nipples are cracked or bleeding.  Your breasts are red, tender, or warm.  You have a swollen area on either breast.  You have a fever or chills.  You have nausea or vomiting.  You have drainage other than breast milk from your nipples.  Your breasts do not become full before feedings by the fifth day after you give birth.  You feel sad and depressed.  Your baby is too sleepy to eat well.  Your baby is having trouble sleeping.   Your baby is wetting less than 3 diapers in a 24-hour period.  Your baby has less than 3 stools in a 24-hour period.  Your baby's skin or the white part of his or her eyes becomes yellow.   Your baby is not gaining weight by 74 days of age. SEEK IMMEDIATE MEDICAL CARE IF:  Your baby is overly tired (lethargic) and does not want to wake up and feed.  Your baby develops an unexplained fever.   This information is not intended to replace advice given to you by your health care provider. Make sure you discuss any questions you have with your health care provider.   Document Released: 04/17/2005 Document Revised: 01/06/2015 Document Reviewed: 10/09/2012 Elsevier Interactive Patient Education 2016 [employer].  Preterm  Labor Information Preterm labor is when labor starts at less than 37 weeks of pregnancy. The normal length of a pregnancy is 39 to 41 weeks. CAUSES Often, there is no identifiable underlying cause as to why a woman goes into preterm labor. One of the most common  known causes of preterm labor is infection. Infections of the uterus, cervix, vagina, amniotic sac, bladder, kidney, or even the lungs (pneumonia) can cause labor to start. Other suspected causes of preterm labor include:   Urogenital infections, such as yeast infections and bacterial vaginosis.   Uterine abnormalities (uterine shape, uterine septum, fibroids, or bleeding from the placenta).   A cervix that has been operated on (it may fail to stay closed).   Malformations in the fetus.   Multiple gestations (twins, triplets, and so on).   Breakage of the amniotic sac.  RISK FACTORS  Having a previous history of preterm labor.   Having premature rupture of membranes (PROM).   Having a placenta that covers the opening of the cervix (placenta previa).   Having a placenta that separates from the uterus (placental abruption).   Having a cervix that is too weak to hold the fetus in the uterus (incompetent cervix).   Having too much fluid in the amniotic sac (polyhydramnios).   Taking illegal drugs or smoking while pregnant.   Not gaining enough weight while pregnant.   Being younger than 24 and older than 30 years old.   Having a low socioeconomic status.   Being African American. SYMPTOMS Signs and symptoms of preterm labor include:   Menstrual-like cramps, abdominal pain, or back pain.  Uterine contractions that are regular, as frequent as six in an hour, regardless of their intensity (may be mild or painful).  Contractions that start on the top of the uterus and spread down to the lower abdomen and back.   A sense of increased pelvic pressure.   A watery or bloody mucus discharge that comes  from the vagina.  TREATMENT Depending on the length of the pregnancy and other circumstances, your health care provider may suggest bed rest. If necessary, there are medicines that can be given to stop contractions and to mature the fetal lungs. If labor happens before 34 weeks of pregnancy, a prolonged hospital stay may be recommended. Treatment depends on the condition of both you and the fetus.  WHAT SHOULD YOU DO IF YOU THINK YOU ARE IN PRETERM LABOR? Call your health care provider right away. You will need to go to the hospital to get checked immediately. HOW CAN YOU PREVENT PRETERM LABOR IN FUTURE PREGNANCIES? You should:   Stop smoking if you smoke.  Maintain healthy weight gain and avoid chemicals and drugs that are not necessary.  Be watchful for any type of infection.  Inform your health care provider if you have a known history of preterm labor.   This information is not intended to replace advice given to you by your health care provider. Make sure you discuss any questions you have with your health care provider.   Document Released: 07/08/2003 Document Revised: 12/18/2012 Document Reviewed: 05/20/2012 Elsevier Interactive Patient Education [employer].

## 2015-10-14 NOTE — Progress Notes (Signed)
Subjective:  Jennifer Cortez is a 30 y.o. [id number] at [redacted]w[redacted]d being seen today for ongoing prenatal care.  She is currently monitored for the following issues for this high-risk pregnancy and has Dermoid cyst of ovary; Supervision of high-risk pregnancy; Short interval between pregnancies complicating pregnancy, antepartum; History of preterm delivery, currently pregnant; Herpes; and Threatened preterm labor, antepartum on her problem list.  Patient reports contractions since 2 wks ago. Worse with rest. Having them daily..  Contractions: Not present.  .  Movement: Present. Denies leaking of fluid.   The following portions of the patient's history were reviewed and updated as appropriate: allergies, current medications, past family history, past medical history, past social history, past surgical history and problem list. Problem list updated.  Objective:   Filed Vitals:   10/14/15 0905  BP: 95/53  Pulse: 75  Weight: 202 lb 6.4 oz (91.808 kg)    Fetal Status: Fetal Heart Rate (bpm): 167 Fundal Height: 32 cm Movement: Present     General:  Alert, oriented and cooperative. Patient is in no acute distress.  Skin: Skin is warm and dry. No rash noted.   Cardiovascular: Normal heart rate noted  Respiratory: Normal respiratory effort, no problems with respiration noted  Abdomen: Soft, gravid, appropriate for gestational age. Pain/Pressure: Present     Pelvic: Cervical exam deferred        Extremities: Normal range of motion.  Edema: None  Mental Status: Normal mood and affect. Normal behavior. Normal judgment and thought content.   Urinalysis: Urine Protein: Trace Urine Glucose: Negative  Assessment and Plan:  Pregnancy: [id number] at [redacted]w[redacted]d  1. Supervision of high-risk pregnancy, third trimester Continue prenatal care.   2. Threatened preterm labor, antepartum Continue to watch for s/sx's of labor  Preterm labor symptoms and general obstetric precautions including but not limited to vaginal  bleeding, contractions, leaking of fluid and fetal movement were reviewed in detail with the patient. Please refer to After Visit Summary for other counseling recommendations.  Return in 2 weeks (on 10/28/2015).   Donnamae Jude, MD

## 2015-10-28 ENCOUNTER — Ambulatory Visit (INDEPENDENT_AMBULATORY_CARE_PROVIDER_SITE_OTHER): Payer: Medicaid Other | Admitting: Obstetrics and Gynecology

## 2015-10-28 VITALS — BP 116/76 | HR 93 | Wt 207.0 lb

## 2015-10-28 DIAGNOSIS — O0993 Supervision of high risk pregnancy, unspecified, third trimester: Secondary | ICD-10-CM

## 2015-10-28 DIAGNOSIS — O4700 False labor before 37 completed weeks of gestation, unspecified trimester: Secondary | ICD-10-CM

## 2015-10-28 DIAGNOSIS — O98313 Other infections with a predominantly sexual mode of transmission complicating pregnancy, third trimester: Secondary | ICD-10-CM

## 2015-10-28 DIAGNOSIS — O09213 Supervision of pregnancy with history of pre-term labor, third trimester: Secondary | ICD-10-CM | POA: Diagnosis not present

## 2015-10-28 DIAGNOSIS — A6009 Herpesviral infection of other urogenital tract: Secondary | ICD-10-CM | POA: Diagnosis not present

## 2015-10-28 DIAGNOSIS — O09893 Supervision of other high risk pregnancies, third trimester: Secondary | ICD-10-CM

## 2015-10-28 NOTE — Progress Notes (Signed)
Subjective:  Jennifer Cortez is a 30 y.o. [id number] at [redacted]w[redacted]d being seen today for ongoing prenatal care.  She is currently monitored for the following issues for this high-risk pregnancy and has Dermoid cyst of ovary; Supervision of high-risk pregnancy; Short interval between pregnancies complicating pregnancy, antepartum; History of preterm delivery, currently pregnant; Herpes; and Threatened preterm labor, antepartum on her problem list.  Patient reports some improving groin pain following a fall. While holding her 31 year old, she slipped over some laundry 2 days ago and almost landed in a spit. She reports good fetal movement and never hurt her abdomen. Contractions: Irritability. Vag. Bleeding: None.  Movement: Present. Denies leaking of fluid.   The following portions of the patient's history were reviewed and updated as appropriate: allergies, current medications, past family history, past medical history, past social history, past surgical history and problem list. Problem list updated.  Objective:   Filed Vitals:   10/28/15 1543  BP: 116/76  Pulse: 93  Weight: 207 lb (93.895 kg)    Fetal Status: Fetal Heart Rate (bpm): 167 Fundal Height: 34 cm Movement: Present     General:  Alert, oriented and cooperative. Patient is in no acute distress.  Skin: Skin is warm and dry. No rash noted.   Cardiovascular: Normal heart rate noted  Respiratory: Normal respiratory effort, no problems with respiration noted  Abdomen: Soft, gravid, appropriate for gestational age. Pain/Pressure: Present     Pelvic: Cervical exam deferred        Extremities: Normal range of motion.  Edema: None  Mental Status: Normal mood and affect. Normal behavior. Normal judgment and thought content.   Urinalysis:      Assessment and Plan:  Pregnancy: [id number] at [redacted]w[redacted]d  1. Supervision of high-risk pregnancy, third trimester Patient is doing well Reassurance provided s/p fall Cultures next visit  2. Short interval  between pregnancies complicating pregnancy, antepartum, third trimester   3. History of preterm delivery, currently pregnant, third trimester Declined 17-P  4. Threatened preterm labor, antepartum Continue monitoring for contractions Patient is s/p BMX 6/1 and 6/2  Preterm labor symptoms and general obstetric precautions including but not limited to vaginal bleeding, contractions, leaking of fluid and fetal movement were reviewed in detail with the patient. Please refer to After Visit Summary for other counseling recommendations.  Return in about 2 weeks (around 11/11/2015).   Mora Bellman, MD

## 2015-11-08 ENCOUNTER — Inpatient Hospital Stay (HOSPITAL_COMMUNITY)
Admission: AD | Admit: 2015-11-08 | Discharge: 2015-11-10 | DRG: 774 | Disposition: A | Discharge status: Home / Self Care | Payer: Medicaid Other | Source: Ambulatory Visit | Attending: Obstetrics & Gynecology | Admitting: Obstetrics & Gynecology

## 2015-11-08 ENCOUNTER — Encounter (HOSPITAL_COMMUNITY): Payer: Self-pay | Admitting: *Deleted

## 2015-11-08 DIAGNOSIS — Z87891 Personal history of nicotine dependence: Secondary | ICD-10-CM | POA: Diagnosis not present

## 2015-11-08 DIAGNOSIS — O09893 Supervision of other high risk pregnancies, third trimester: Secondary | ICD-10-CM

## 2015-11-08 DIAGNOSIS — O09213 Supervision of pregnancy with history of pre-term labor, third trimester: Secondary | ICD-10-CM

## 2015-11-08 DIAGNOSIS — O0993 Supervision of high risk pregnancy, unspecified, third trimester: Secondary | ICD-10-CM

## 2015-11-08 DIAGNOSIS — Z833 Family history of diabetes mellitus: Secondary | ICD-10-CM | POA: Diagnosis not present

## 2015-11-08 DIAGNOSIS — O4700 False labor before 37 completed weeks of gestation, unspecified trimester: Secondary | ICD-10-CM

## 2015-11-08 DIAGNOSIS — O9832 Other infections with a predominantly sexual mode of transmission complicating childbirth: Secondary | ICD-10-CM | POA: Diagnosis present

## 2015-11-08 DIAGNOSIS — Z8719 Personal history of other diseases of the digestive system: Secondary | ICD-10-CM

## 2015-11-08 DIAGNOSIS — Z3A36 36 weeks gestation of pregnancy: Secondary | ICD-10-CM

## 2015-11-08 DIAGNOSIS — O99344 Other mental disorders complicating childbirth: Secondary | ICD-10-CM | POA: Diagnosis present

## 2015-11-08 DIAGNOSIS — A6000 Herpesviral infection of urogenital system, unspecified: Secondary | ICD-10-CM | POA: Diagnosis present

## 2015-11-08 DIAGNOSIS — F418 Other specified anxiety disorders: Secondary | ICD-10-CM | POA: Diagnosis present

## 2015-11-08 LAB — CBC
HCT: 34.9 % — ABNORMAL LOW (ref 36.0–46.0)
Hemoglobin: 12 g/dL (ref 12.0–15.0)
MCH: 31.7 pg (ref 26.0–34.0)
MCHC: 34.4 g/dL (ref 30.0–36.0)
MCV: 92.1 fL (ref 78.0–100.0)
Platelets: 281 10*3/uL (ref 150–400)
RBC: 3.79 MIL/uL — ABNORMAL LOW (ref 3.87–5.11)
RDW: 13.2 % (ref 11.5–15.5)
WBC: 9.7 10*3/uL (ref 4.0–10.5)

## 2015-11-08 LAB — TYPE AND SCREEN
ABO/RH(D): A POS
Antibody Screen: NEGATIVE

## 2015-11-08 MED ORDER — OXYCODONE-ACETAMINOPHEN 5-325 MG PO TABS
2.0000 | ORAL_TABLET | ORAL | Status: DC | PRN
Start: 2015-11-08 — End: 2015-11-08

## 2015-11-08 MED ORDER — ZOLPIDEM TARTRATE 5 MG PO TABS: 5.0000 mg | ORAL_TABLET | Freq: Every evening | ORAL | Status: DC | PRN

## 2015-11-08 MED ORDER — DIPHENHYDRAMINE HCL 50 MG/ML IJ SOLN: 12.5000 mg | INTRAMUSCULAR | Status: DC | PRN

## 2015-11-08 MED ORDER — SOD CITRATE-CITRIC ACID 500-334 MG/5ML PO SOLN: 30.0000 mL | ORAL | Status: DC | PRN

## 2015-11-08 MED ORDER — PHENYLEPHRINE 40 MCG/ML (10ML) SYRINGE FOR IV PUSH (FOR BLOOD PRESSURE SUPPORT)
80.0000 ug | PREFILLED_SYRINGE | INTRAVENOUS | Status: DC | PRN
  Filled 2015-11-08: qty 5
  Filled 2015-11-08: qty 10

## 2015-11-08 MED ORDER — LIDOCAINE HCL (PF) 1 % IJ SOLN
INTRAMUSCULAR | Status: AC
  Filled 2015-11-08: qty 30

## 2015-11-08 MED ORDER — SIMETHICONE 80 MG PO CHEW: 80.0000 mg | CHEWABLE_TABLET | ORAL | Status: DC | PRN

## 2015-11-08 MED ORDER — LACTATED RINGERS IV SOLN
500.0000 mL | Freq: Once | INTRAVENOUS | Status: AC
  Administered 2015-11-08: 500 mL via INTRAVENOUS

## 2015-11-08 MED ORDER — SODIUM CHLORIDE 0.9 % IV SOLN
2.0000 g | Freq: Once | INTRAVENOUS | Status: AC
  Administered 2015-11-08: 2 g via INTRAVENOUS
  Filled 2015-11-08: qty 2000

## 2015-11-08 MED ORDER — IBUPROFEN 600 MG PO TABS
600.0000 mg | ORAL_TABLET | Freq: Four times a day (QID) | ORAL | Status: DC
  Administered 2015-11-08 – 2015-11-10 (×9): 600 mg via ORAL
  Filled 2015-11-08 (×10): qty 1

## 2015-11-08 MED ORDER — OXYTOCIN 40 UNITS IN LACTATED RINGERS INFUSION - SIMPLE MED
2.5000 [IU]/h | INTRAVENOUS | Status: DC
  Administered 2015-11-08: 2.5 [IU]/h via INTRAVENOUS
  Filled 2015-11-08: qty 1000

## 2015-11-08 MED ORDER — LACTATED RINGERS IV SOLN
INTRAVENOUS | Status: DC
Start: 2015-11-08 — End: 2015-11-08
  Administered 2015-11-08: 09:00:00 via INTRAVENOUS

## 2015-11-08 MED ORDER — ONDANSETRON HCL 4 MG PO TABS: 4.0000 mg | ORAL_TABLET | ORAL | Status: DC | PRN

## 2015-11-08 MED ORDER — ONDANSETRON HCL 4 MG/2ML IJ SOLN: 4.0000 mg | Freq: Four times a day (QID) | INTRAMUSCULAR | Status: DC | PRN

## 2015-11-08 MED ORDER — PHENYLEPHRINE 40 MCG/ML (10ML) SYRINGE FOR IV PUSH (FOR BLOOD PRESSURE SUPPORT)
80.0000 ug | PREFILLED_SYRINGE | INTRAVENOUS | Status: DC | PRN
Start: 2015-11-08 — End: 2015-11-08
  Filled 2015-11-08: qty 5

## 2015-11-08 MED ORDER — LACTATED RINGERS IV SOLN: 500.0000 mL | INTRAVENOUS | Status: DC | PRN

## 2015-11-08 MED ORDER — DIPHENHYDRAMINE HCL 25 MG PO CAPS: 25.0000 mg | ORAL_CAPSULE | Freq: Four times a day (QID) | ORAL | Status: DC | PRN

## 2015-11-08 MED ORDER — OXYCODONE-ACETAMINOPHEN 5-325 MG PO TABS
1.0000 | ORAL_TABLET | ORAL | Status: DC | PRN
Start: 2015-11-08 — End: 2015-11-08

## 2015-11-08 MED ORDER — DIBUCAINE 1 % RE OINT: 1.0000 "application " | TOPICAL_OINTMENT | RECTAL | Status: DC | PRN

## 2015-11-08 MED ORDER — WITCH HAZEL-GLYCERIN EX PADS: 1.0000 "application " | MEDICATED_PAD | CUTANEOUS | Status: DC | PRN

## 2015-11-08 MED ORDER — BENZOCAINE-MENTHOL 20-0.5 % EX AERO: 1.0000 "application " | INHALATION_SPRAY | CUTANEOUS | Status: DC | PRN

## 2015-11-08 MED ORDER — OXYTOCIN BOLUS FROM INFUSION: 500.0000 mL | INTRAVENOUS | Status: DC

## 2015-11-08 MED ORDER — EPHEDRINE 5 MG/ML INJ
10.0000 mg | INTRAVENOUS | Status: DC | PRN
  Filled 2015-11-08: qty 2

## 2015-11-08 MED ORDER — COCONUT OIL OIL
1.0000 "application " | TOPICAL_OIL | Status: DC | PRN
  Filled 2015-11-08: qty 120

## 2015-11-08 MED ORDER — ONDANSETRON HCL 4 MG/2ML IJ SOLN: 4.0000 mg | INTRAMUSCULAR | Status: DC | PRN

## 2015-11-08 MED ORDER — FENTANYL CITRATE (PF) 100 MCG/2ML IJ SOLN
100.0000 ug | INTRAMUSCULAR | Status: DC | PRN
  Administered 2015-11-08: 100 ug via INTRAVENOUS

## 2015-11-08 MED ORDER — FENTANYL 2.5 MCG/ML BUPIVACAINE 1/10 % EPIDURAL INFUSION (WH - ANES)
14.0000 mL/h | INTRAMUSCULAR | Status: DC | PRN
  Filled 2015-11-08: qty 125

## 2015-11-08 MED ORDER — EPHEDRINE 5 MG/ML INJ
10.0000 mg | INTRAVENOUS | Status: DC | PRN
Start: 2015-11-08 — End: 2015-11-08
  Filled 2015-11-08: qty 2

## 2015-11-08 MED ORDER — ACETAMINOPHEN 325 MG PO TABS: 650.0000 mg | ORAL_TABLET | ORAL | Status: DC | PRN

## 2015-11-08 MED ORDER — PRENATAL MULTIVITAMIN CH
1.0000 | ORAL_TABLET | Freq: Every day | ORAL | Status: DC
  Administered 2015-11-09 – 2015-11-10 (×2): 1 via ORAL
  Filled 2015-11-08 (×3): qty 1

## 2015-11-08 MED ORDER — TETANUS-DIPHTH-ACELL PERTUSSIS 5-2.5-18.5 LF-MCG/0.5 IM SUSP: 0.5000 mL | Freq: Once | INTRAMUSCULAR | Status: DC

## 2015-11-08 MED ORDER — FENTANYL CITRATE (PF) 100 MCG/2ML IJ SOLN
INTRAMUSCULAR | Status: AC
  Filled 2015-11-08: qty 2

## 2015-11-08 MED ORDER — OXYCODONE-ACETAMINOPHEN 5-325 MG PO TABS
1.0000 | ORAL_TABLET | Freq: Four times a day (QID) | ORAL | Status: DC | PRN
  Administered 2015-11-08: 1 via ORAL
  Filled 2015-11-08: qty 1

## 2015-11-08 MED ORDER — SENNOSIDES-DOCUSATE SODIUM 8.6-50 MG PO TABS
2.0000 | ORAL_TABLET | ORAL | Status: DC
  Administered 2015-11-08 – 2015-11-09 (×2): 2 via ORAL
  Filled 2015-11-08 (×2): qty 2

## 2015-11-08 MED ORDER — LIDOCAINE HCL (PF) 1 % IJ SOLN
30.0000 mL | INTRAMUSCULAR | Status: DC | PRN
  Filled 2015-11-08: qty 30

## 2015-11-08 NOTE — Progress Notes (Signed)
Report to Caryl Pina, RN in birthing suites.  Patient admitted to room 167.

## 2015-11-08 NOTE — MAU Note (Signed)
Urine in lab 

## 2015-11-08 NOTE — H&P (Signed)
LABOR AND DELIVERY ADMISSION HISTORY AND PHYSICAL NOTE  Jennifer Cortez is a 30 y.o. female [phone] with IUP at [redacted]w[redacted]d by 6 wk [place] presenting for preterm labor.  She has hx of preterm birth with 2 prior pregnancies.  She is s/p BMZ on 6/1 and 6/2.  She presented to MAU and rapidly progressed from 4-7 cm and was admitted to [facility].    She reports positive fetal movement. She denies leakage of fluid or vaginal bleeding.   Clinic  [facility] Prenatal Labs  Dating  LMP c/w 6 week ultrasound Blood type: A/POS/-- (01/26 1009) A pos  Genetic Screen Quad:      declined        Antibody:NEG (01/26 1009)neg  Anatomic [place]  Normal  Rubella: 1.50 (01/26 1009)Immune  GTT Third trimester: 68 RPR: NON REAC (01/26 1009) NR  Flu vaccine  06/24/15 HBsAg: NEGATIVE (01/26 1009) neg  TDaP vaccine  09/09/15                                             HIV: NONREACTIVE (01/26 1009) NR  Baby Food  Breast/ bottle                                            GBS:   Contraception  OCP Pap: Normal (09/2013)  Circumcision Yes   [place] for [place] - FOB       Prenatal History/Complications:  Past Medical History: Past Medical History  Diagnosis Date  . Depression   . Anxiety   . Dermoid cyst of right ovary      5 cm dermoid in right ovary -> plan laparoscopic removal after delivery.  [place] stones   . HSV (herpes simplex virus) infection     Past Surgical History: Past Surgical History  Procedure Laterality Date  . Gall stones    . Multiple tooth extractions    . Dilation and curettage of uterus      Obstetrical History: OB History    Gravida Para Term Preterm AB TAB SAB Ectopic Multiple Living   9 4 3 1 4 3 1  0 0 4      Social History: Social History   Social History  . Marital Status: Single    Spouse Name: N/A  . Number of Children: N/A  . Years of Education: N/A   Social History Main Topics  . Smoking status: Former Smoker -- 0.25  packs/day    Types: Cigarettes    Quit date: 11/25/2013  . Smokeless tobacco: Never Used  . Alcohol Use: Yes     Comment: social, not while pregnant  . Drug Use: No     Comment: quit mj  . Sexual Activity: Yes    Birth Control/ Protection: None   Other Topics Concern  . None   Social History Narrative    Family History: Family History  Problem Relation Age of Onset  . Diabetes Maternal Grandmother   . Hepatitis B Maternal Grandmother     Allergies: No Known Allergies  Prescriptions prior to admission  Medication Sig Dispense Refill Last Dose  . docusate sodium (COLACE) 100 MG capsule Take 1 capsule (100 mg total) by mouth 2 (two)  times daily as needed. (Patient not taking: Reported on 11/08/2015) 30 capsule 2 Taking  . Prenatal Multivit-Min-Fe-FA (PRENATAL VITAMINS) 0.8 MG tablet Take 1 tablet by mouth daily. 30 tablet 12 11/06/2015  . valACYclovir (VALTREX) 1000 MG tablet Take 1 tablet (1,000 mg total) by mouth daily. 30 tablet 2 11/06/2015     Review of Systems   All systems reviewed and negative except as stated in HPI  Blood pressure 112/74, pulse 105, temperature 97.9 F (36.6 C), temperature source Oral, resp. rate 16, height 5\' 5"  (1.651 m), weight 94.802 kg (209 lb), last menstrual period 02/27/2015, SpO2 99 %, not currently breastfeeding. General appearance: alert, cooperative and mild distress Lungs: clear to auscultation bilaterally Heart: regular rate and rhythm Abdomen: soft, non-tender; bowel sounds normal Extremities: No calf swelling or tenderness Presentation: cephalic Fetal monitoring: cat 1 Uterine activity: contractions q30min  Dilation: 7 Effacement (%): 80 Station: -3 Exam by:: Daneil Dan, RNC   Prenatal labs: ABO, Rh: A/POS/-- (01/26 1009) Antibody: NEG (01/26 1009) Rubella: !Error! RPR: NON REAC (05/11 [id number])  HBsAg: NEGATIVE (01/26 1009)  HIV: NONREACTIVE (05/11 0938)  GBS:   unknown 1 hr Glucola: 68 passed Genetic screening:   declined Anatomy US: normal female  Prenatal Transfer Tool  Maternal Diabetes: No Genetic Screening: Declined Maternal Ultrasounds/Referrals: Normal Fetal Ultrasounds or other Referrals:  None Maternal Substance Abuse:  No Significant Maternal Medications:  Meds include: Other: valcyclovir for HSV Significant Maternal Lab Results: None  No results found for this or any previous visit (from the past 24 hour(s)).  Patient Active Problem List   Diagnosis Date Noted  . Threatened preterm labor, antepartum 09/30/2015  . Herpes 05/28/2015  . Supervision of high-risk pregnancy 05/27/2015  . Short interval between pregnancies complicating pregnancy, antepartum 05/27/2015  . History of preterm delivery, currently pregnant 05/27/2015  . Dermoid cyst of ovary 12/25/2013    Assessment: Jennifer Cortez is a 30 y.o. [id number] at [redacted]w[redacted]d here for preterm labor.   #Labor: contractions q3, 7cm #Pain: Desires epidural #FWB: Cat 1  #ID:  GBS unk - ampicillin 2/2 likely imminent delivery.  Of note pt GBS negative in January during this pregnancy. #MOF: breast #MOC: OCPs (will counsel about breastfeeding and OCPs) #Circ:  outpatient  Ralene Ok 11/08/2015, 9:13 AM   I have seen this patient and agree with the above resident's note.  [place], [place] Certified Nurse-Midwife

## 2015-11-08 NOTE — Lactation Note (Signed)
This note was copied from a baby's chart. Lactation Consultation Note  Patient Name: Jennifer Cortez [id number] Date: 11/08/2015 Reason for consult: Initial assessment Baby at 6 hr of life. Experienced bf mom reports baby is feeding well, she wishes to ebf at this time. She bf 3 of her 4 older children until she went back to work. Discussed LPT infant behavior, feeding frequency, pumping, supplementing, baby belly size, voids, wt loss, breast changes, and nipple care. She can manually express and has spoon in room. Given lactation and LPT  handouts. Aware of OP services and support group. She will call as needed.     Maternal Data Has patient been taught Hand Expression?: Yes Does the patient have breastfeeding experience prior to this delivery?: Yes  Feeding Feeding Type: Breast Fed Length of feed: 0 min  LATCH Score/Interventions Latch: Repeated attempts needed to sustain latch, nipple held in mouth throughout feeding, stimulation needed to elicit sucking reflex. Intervention(s): Adjust position;Assist with latch;Breast compression;Breast massage  Audible Swallowing: A few with stimulation Intervention(s): Skin to skin;Hand expression Intervention(s): Skin to skin;Hand expression  Type of Nipple: Everted at rest and after stimulation  Comfort (Breast/Nipple): Soft / non-tender     Hold (Positioning): Assistance needed to correctly position infant at breast and maintain latch. Intervention(s): Breastfeeding basics reviewed;Support Pillows;Position options;Skin to skin  LATCH Score: 7  Lactation Tools Discussed/Used WIC Program: Yes Pump Review: Setup, frequency, and cleaning;Milk Storage Initiated by:: Raiford Simmonds  Date initiated:: 11/08/15   Consult Status Consult Status: Follow-up Date: 11/09/15 Follow-up type: In-patient    Denzil Hughes 11/08/2015, 4:32 PM

## 2015-11-08 NOTE — MAU Note (Signed)
Patient started contracting last night, but they went away around 0300.  Contractions woke her around 0600 and they are now 3-35min apart and pain is 8/10.  Hx preterm labor with first baby. Denies LOF or vaginal bleeding.

## 2015-11-09 LAB — RPR: RPR Ser Ql: NONREACTIVE

## 2015-11-09 LAB — CBC
HCT: 31.9 % — ABNORMAL LOW (ref 36.0–46.0)
Hemoglobin: 11.1 g/dL — ABNORMAL LOW (ref 12.0–15.0)
MCH: 32.1 pg (ref 26.0–34.0)
MCHC: 34.8 g/dL (ref 30.0–36.0)
MCV: 92.2 fL (ref 78.0–100.0)
Platelets: 250 10*3/uL (ref 150–400)
RBC: 3.46 MIL/uL — ABNORMAL LOW (ref 3.87–5.11)
RDW: 13.1 % (ref 11.5–15.5)
WBC: 9.2 10*3/uL (ref 4.0–10.5)
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
## 2015-11-09 NOTE — Progress Notes (Signed)
Post Partum Day 1  Subjective:  Jennifer Cortez is a 30 y.o. [id number] [redacted]w[redacted]d s/p SVD.  No acute events overnight.  Pt denies problems with ambulating, voiding or po intake.  She denies nausea or vomiting.  Pain is well controlled.  She has had flatus. She has not had bowel movement.  Lochia Small.  Plan for birth control is undecided, considering IUD vs POPs.  Method of Feeding: breast  Objective: BP 113/71 mmHg  Pulse 71  Temp(Src) 98.3 F (36.8 C) (Oral)  Resp 18  Ht 5\' 5"  (1.651 m)  Wt 94.802 kg (209 lb)  BMI 34.78 kg/m2  SpO2 100%  LMP 02/27/2015 (Exact Date)  Breastfeeding? Unknown  Physical Exam:  General: alert, cooperative and no distress Lochia:normal flow Chest: CTAB Heart: RRR no m/r/g Abdomen: +BS, soft, nontender, fundus firm at/below umbilicus Uterine Fundus: firm, nontender DVT Evaluation: No evidence of DVT seen on physical exam. Extremities: no edema   Recent Labs  11/08/15 0930 11/09/15 0501  HGB 12.0 11.1*  HCT 34.9* 31.9*    Assessment/Plan:  ASSESSMENT: Jennifer Cortez is a 30 y.o. [id number] [redacted]w[redacted]d ppd #1 s/p NSVD doing well.   Plan for discharge tomorrow and Breastfeeding   LOS: 1 day   Ralene Ok 11/09/2015, 9:47 AM

## 2015-11-09 NOTE — Lactation Note (Signed)
This note was copied from a baby's chart. Lactation Consultation Note  Patient Name: Jennifer Cortez [id number] Date: 11/09/2015 Reason for consult: Late preterm infant Mom currently has baby to breast.  Baby nursing actively with audible swallows.  Mom states baby has been cluster feeding so she is unable to pump.  Encouraged to pump if baby becomes sleepier or poor feeding at breast.  Instructed on breast massage during feeding.  Maternal Data    Feeding Feeding Type: Breast Fed  LATCH Score/Interventions Latch: Grasps breast easily, tongue down, lips flanged, rhythmical sucking.  Audible Swallowing: Spontaneous and intermittent Intervention(s): Alternate breast massage  Type of Nipple: Everted at rest and after stimulation  Comfort (Breast/Nipple): Soft / non-tender     Hold (Positioning): No assistance needed to correctly position infant at breast.  LATCH Score: 10  Lactation Tools Discussed/Used     Consult Status Consult Status: Follow-up Date: 11/10/15 Follow-up type: In-patient    Ave Filter 11/09/2015, 3:01 PM

## 2015-11-10 MED ORDER — NORETHINDRONE 0.35 MG PO TABS: 1.0000 | ORAL_TABLET | Freq: Every day | ORAL | Status: DC

## 2015-11-10 MED ORDER — ACETAMINOPHEN 325 MG PO TABS: 650.0000 mg | ORAL_TABLET | ORAL | Status: DC | PRN

## 2015-11-10 MED ORDER — IBUPROFEN 600 MG PO TABS: 600.0000 mg | ORAL_TABLET | Freq: Four times a day (QID) | ORAL | Status: DC

## 2015-11-10 NOTE — Discharge Instructions (Signed)

## 2015-11-10 NOTE — Progress Notes (Signed)
Post Partum Day 2 Subjective: up ad lib, voiding, tolerating PO and + flatus  Pain 3/10 this morning. Some difficulty with breastfeeding from sore nipples.  Objective: Blood pressure 106/55, pulse 62, temperature 98.2 F (36.8 C), temperature source Oral, resp. rate 18, height 5\' 5"  (1.651 m), weight 94.802 kg (209 lb), last menstrual period 02/27/2015, SpO2 100 %, unknown if currently breastfeeding.  Physical Exam:  General: alert, cooperative and no distress Lochia: appropriate Uterine Fundus: firm DVT Evaluation: No evidence of DVT seen on physical exam.   Recent Labs  11/08/15 0930 11/09/15 0501  HGB 12.0 11.1*  HCT 34.9* 31.9*    Assessment/Plan: PPD#2. Desires to go home. Contraception: POPs   LOS: 2 days   Camdynn Maranto E [place] 11/10/2015, 7:36 AM

## 2015-11-10 NOTE — Lactation Note (Signed)
This note was copied from a baby's chart. Lactation Consultation Note  Patient Name: Jennifer Cortez OPPUG'G Date: 11/10/2015 Reason for consult: Follow-up assessment;Infant weight loss;Other (Comment);Late preterm infant (8% weight loss, Bili check at 38 hours - 8 )  Baby is 60 hours old, per mom started supplementing on nights due to nipples being sore. Per mom I'm thinking I will breast feed some, and formula  Feed from a bottle due to the sore nipples and having other kids at home. Per mom WIC called and I have appt. For next week. LC recommended due the 8 % weight loss,  LPT baby , would recommend called WIC back to day to request a DEBP loaner , and LC would fax the [facility] loaner referral. Per mom she wanted to just go home and use hand pump ,  Keep her appt. For next week and not have [facility] send a [facility] loaner referral. LC recommended taking her DEBP kit with her just in case she changed her mind about breast feeding and pumping.  Mom has been using the coconut oil , encouraged EBM to nipples 1st. Sore nipple and engorgement prevention and tx reviewed. And prevention.  Mother informed of post-discharge support and given phone number to the lactation department, including services for phone call assistance; out-patient appointments; and breastfeeding support group. List of other breastfeeding resources in the community given in the handout. Encouraged mother to call for problems or concerns related to breastfeeding.   Maternal Data Has patient been taught Hand Expression?:  (encouraged before latching for sore nipples )  Feeding Feeding Type: Formula (per mom bottle feeding due to sore nipples ) Nipple Type: Slow - flow  LATCH Score/Interventions                Intervention(s): Breastfeeding basics reviewed     Lactation Tools Discussed/Used Tools: Pump (per mom has only pumped x1 in the last 24 with out results ) Breast pump type: Double-Electric Breast Pump WIC Program: Yes  (per mom has appt. next week , see LC note )   Consult Status Consult Status: Follow-up Date: 11/10/15    Myer Haff 11/10/2015, 9:51 AM

## 2015-11-10 NOTE — Discharge Summary (Signed)
OB Discharge Summary     Patient Name: Jennifer Cortez DOB: [birth year] MRN: [id number]  Date of admission: 11/08/2015 Delivering MD: Sela Hilding   Date of discharge: 11/10/2015  Admitting diagnosis: 36wks labor Intrauterine pregnancy: [redacted]w[redacted]d     Secondary diagnosis:  Active Problems:   Active preterm labor  Additional problems: none     Discharge diagnosis: Preterm Pregnancy Delivered                                                                                                Post partum procedures:none  Augmentation: none  Complications: None  Hospital course:  Onset of Labor With Vaginal Delivery     30 y.o. yo [id number] at [redacted]w[redacted]d was admitted in Active Labor on 11/08/2015. Delivered approximately one hour after arrival. Patient had an uncomplicated labor course as follows:  Membrane Rupture Time/Date: 9:52 AM ,11/08/2015   Intrapartum Procedures: Episiotomy: None [1]                                         Lacerations:  None [1]  Patient had a delivery of a Viable infant. 11/08/2015  Information for the patient's newborn:  Ravleen, Boccia [id number]  Delivery Method: Vaginal, Spontaneous Delivery (Filed from Delivery Summary)     Pateint had an uncomplicated postpartum course.  She is ambulating, tolerating a regular diet, passing flatus, and urinating well. Patient is discharged home in stable condition on 11/10/2015.    Physical exam  Filed Vitals:   11/09/15 0203 11/09/15 0620 11/09/15 1858 11/10/15 0554  BP: 110/68 113/71 89/51 106/55  Pulse: 62 71 66 62  Temp: 98.2 F (36.8 C) 98.3 F (36.8 C) 98.2 F (36.8 C) 98.2 F (36.8 C)  TempSrc: Oral  Oral Oral  Resp: 16 18 18 18   Height:      Weight:      SpO2: 100%      General: alert, cooperative and no distress Lochia: appropriate Uterine Fundus: firm Incision: N/A DVT Evaluation: No cords or calf tenderness. No significant calf/ankle edema. Labs: Lab Results  Component Value Date   WBC  9.2 11/09/2015   HGB 11.1* 11/09/2015   HCT 31.9* 11/09/2015   MCV 92.2 11/09/2015   PLT 250 11/09/2015   CMP Latest Ref Rng 02/05/2013  Glucose 70 - 99 mg/dL 95  BUN 6 - 23 mg/dL 13  Creatinine 0.50 - 1.10 mg/dL 0.76  Sodium 135 - 145 mEq/L 137  Potassium 3.5 - 5.1 mEq/L 4.0  Chloride 96 - 112 mEq/L 103  CO2 19 - 32 mEq/L 24  Calcium 8.4 - 10.5 mg/dL 9.3  Total Protein 6.0 - 8.3 g/dL 7.4  Total Bilirubin 0.3 - 1.2 mg/dL 0.2(L)  Alkaline Phos 39 - 117 U/L 83  AST 0 - 37 U/L 19  ALT 0 - 35 U/L 13    Discharge instruction: per After Visit Summary and "Baby and Me Booklet".  After visit meds:    Medication List    STOP taking  these medications        valACYclovir 1000 MG tablet  Commonly known as:  VALTREX      TAKE these medications        acetaminophen 325 MG tablet  Commonly known as:  TYLENOL  Take 2 tablets (650 mg total) by mouth every 4 (four) hours as needed (for pain scale < 4).     docusate sodium 100 MG capsule  Commonly known as:  COLACE  Take 1 capsule (100 mg total) by mouth 2 (two) times daily as needed.     ibuprofen 600 MG tablet  Commonly known as:  ADVIL,MOTRIN  Take 1 tablet (600 mg total) by mouth every 6 (six) hours.     norethindrone 0.35 MG tablet  Commonly known as:  MICRONOR,CAMILA,ERRIN  Take 1 tablet (0.35 mg total) by mouth daily.     Prenatal Vitamins 0.8 MG tablet  Take 1 tablet by mouth daily.        Diet: routine diet  Activity: Advance as tolerated. Pelvic rest for 6 weeks.   Outpatient follow up:6 weeks Follow up Appt:No future appointments. Follow up Visit:No Follow-up on file.  Postpartum contraception: Progesterone only pills  Newborn Data: Live born female  Birth Weight: 6 lb 12.6 oz (3080 g) APGAR: 7, 8  Baby Feeding: Bottle Disposition: pending   11/10/2015 Desma Maxim, MD

## 2015-11-16 ENCOUNTER — Encounter: Payer: Self-pay | Admitting: Family

## 2015-12-14 ENCOUNTER — Encounter: Payer: Self-pay | Admitting: *Deleted

## 2016-01-05 ENCOUNTER — Encounter: Payer: Self-pay | Admitting: Family Medicine

## 2016-01-25 ENCOUNTER — Encounter: Payer: Self-pay | Admitting: Advanced Practice Midwife

## 2016-01-25 ENCOUNTER — Ambulatory Visit (INDEPENDENT_AMBULATORY_CARE_PROVIDER_SITE_OTHER): Payer: Medicaid Other | Admitting: Advanced Practice Midwife

## 2016-01-25 VITALS — BP 110/53 | HR 59 | Ht 66.0 in | Wt 200.1 lb

## 2016-01-25 DIAGNOSIS — F53 Postpartum depression: Secondary | ICD-10-CM

## 2016-01-25 DIAGNOSIS — Z30011 Encounter for initial prescription of contraceptive pills: Secondary | ICD-10-CM

## 2016-01-25 DIAGNOSIS — E010 Iodine-deficiency related diffuse (endemic) goiter: Secondary | ICD-10-CM

## 2016-01-25 DIAGNOSIS — D271 Benign neoplasm of left ovary: Secondary | ICD-10-CM

## 2016-01-25 DIAGNOSIS — O99345 Other mental disorders complicating the puerperium: Secondary | ICD-10-CM

## 2016-01-25 DIAGNOSIS — K8020 Calculus of gallbladder without cholecystitis without obstruction: Secondary | ICD-10-CM

## 2016-01-25 LAB — POCT PREGNANCY, URINE: Preg Test, Ur: NEGATIVE

## 2016-01-25 LAB — TSH: TSH: 1.03 mIU/L

## 2016-01-25 MED ORDER — NORGESTIMATE-ETH ESTRADIOL 0.25-35 MG-MCG PO TABS: 1.0000 | ORAL_TABLET | Freq: Every day | ORAL | 12 refills | Status: DC

## 2016-01-25 NOTE — Patient Instructions (Signed)
[place], [place] [id number]   Biliary Colic Biliary colic is a pain in the upper abdomen. The pain:  Is usually felt on the right side of the abdomen, but it may also be felt in the center of the abdomen, just below the breastbone (sternum).  May spread back toward the right shoulder blade.  May be steady or irregular.  May be accompanied by nausea and vomiting. Most of the time, the pain goes away in 1-5 hours. After the most intense pain passes, the abdomen may continue to ache mildly for about 24 hours. Biliary colic is caused by a blockage in the bile duct. The bile duct is a pathway that carries bile--a liquid that helps to digest fats--from the gallbladder to the small intestine. Biliary colic usually occurs after eating, when the digestive system demands bile. The pain develops when muscle cells contract forcefully to try to move the blockage so that bile can get by. HOME CARE INSTRUCTIONS  Take medicines only as directed by your health care provider.  Drink enough fluid to keep your urine clear or pale yellow.  Avoid fatty, greasy, and fried foods. These kinds of foods increase your body's demand for bile.  Avoid any foods that make your pain worse.  Avoid overeating.  Avoid having a large meal after fasting. SEEK MEDICAL CARE IF:  You develop a fever.  Your pain gets worse.  You vomit.  You develop nausea that prevents you from eating and drinking. SEEK IMMEDIATE MEDICAL CARE IF:  You suddenly develop a fever and shaking chills.  You develop a yellowish discoloration (jaundice) of:  Skin.  Whites of the eyes.  Mucous membranes.  You have continuous or severe pain that is not relieved with medicines.  You have nausea and vomiting that is not relieved with medicines.  You develop dizziness or you faint.   This information is not intended to replace advice given to you by your health care provider. Make sure you  discuss any questions you have with your health care provider.   Document Released: 09/18/2005 Document Revised: 09/01/2014 Document Reviewed: 01/27/2014 Elsevier Interactive Patient Education [employer].

## 2016-01-25 NOTE — Progress Notes (Signed)
Subjective:     Jennifer Cortez is a 30 y.o. female who presents for a postpartum visit. She is 11 weeks postpartum following a spontaneous vaginal delivery. I have fully reviewed the prenatal and intrapartum course. The delivery was at [redacted]w[redacted]d gestational weeks. Outcome: spontaneous vaginal delivery. Anesthesia: none. Postpartum course has been complicated by some sleep disturbance, and mood disturbance. Reports increase in gall stone pain not resolving w/ diet changes. Denies fever, chills, SI/HI. Baby's course has been Uncomplicated. Baby is feeding by bottle - Similac Advance. Bleeding no bleeding. Bowel function is normal. Bladder function is normal. Patient is sexually active. Contraception method is condoms. Postpartum depression screening: positive.   Dermoid cyst noted on previous US. Told to have F/U US PP.   The following portions of the patient's history were reviewed and updated as appropriate: allergies, current medications, past family history, past medical history, past social history, past surgical history and problem list.  Review of Systems Pertinent items are noted in HPI.   Objective:    BP (!) 110/53 (BP Location: Left Arm, Patient Position: Sitting, Cuff Size: Normal)   Pulse (!) 59   Ht 5\' 6"  (1.676 m)   Wt 200 lb 1.6 oz (90.8 kg)   LMP 01/14/2016 (Exact Date)   Breastfeeding? No   BMI 32.30 kg/m   General:  alert, cooperative, appears stated age and no distress   Breasts:  declined  Lungs: clear to auscultation bilaterally  Heart:  regular rate and rhythm, S1, S2 normal, no murmur, click, rub or gallop  Abdomen: soft, non-tender; bowel sounds normal; no masses,  no organomegaly   Vulva:  not evaluated                        Neck: Mild thyromegaly  Assessment:   1. Gall stones  - Ambulatory referral to General Surgery  2. Encounter for initial prescription of contraceptive pills  - norgestimate-ethinyl estradiol (ORTHO-CYCLEN,SPRINTEC,PREVIFEM) 0.25-35  MG-MCG tablet; Take 1 tablet by mouth daily.  Dispense: 1 Package; Refill: 12  3. Postpartum depression - F/U visit w/ Jamie - TSH  4. Thyromegaly  - TSH  5. Dermoid cyst of ovary, left  - US Pelvis Complete; Future - US Transvaginal Non-OB; Future   Plan:    1. Contraception: OCP (estrogen/progesterone) 2. Biliary colic diet 3. Follow up in: 4 weeks w/ surgeon to discuss [place] results or as needed.

## 2016-01-28 ENCOUNTER — Ambulatory Visit (HOSPITAL_COMMUNITY): Payer: Medicaid Other

## 2016-02-17 ENCOUNTER — Ambulatory Visit (HOSPITAL_COMMUNITY): Payer: Medicaid Other | Attending: Advanced Practice Midwife

## 2016-10-17 IMAGING — US US MFM OB COMP +14 WKS
1 series · 14 of 28 positions shown · non-contrast
Comparison: none

[Series 1: us mfm ob comp +14 wks · 75 acquisitions, 14 frames shown]
[im 3/75]
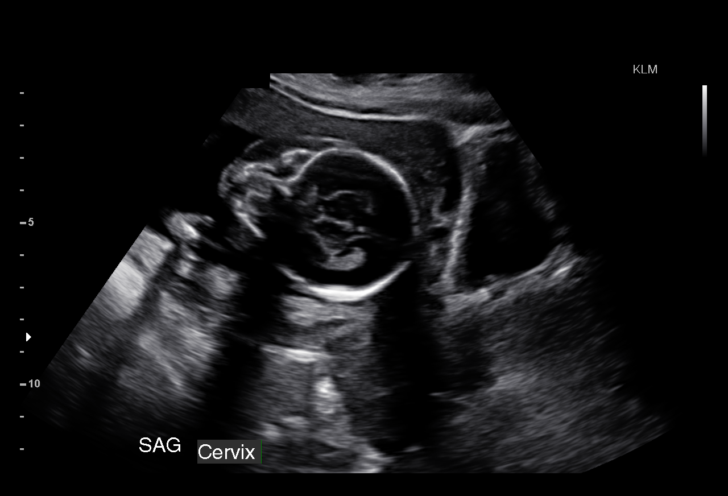
[im 9/75]
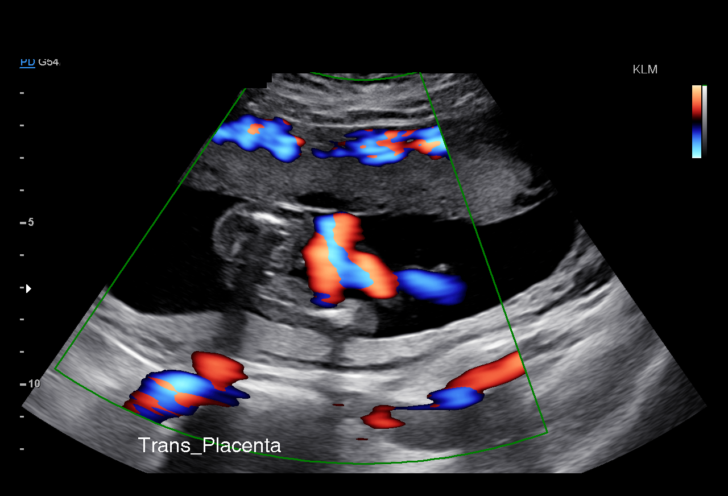
[im 14/75]
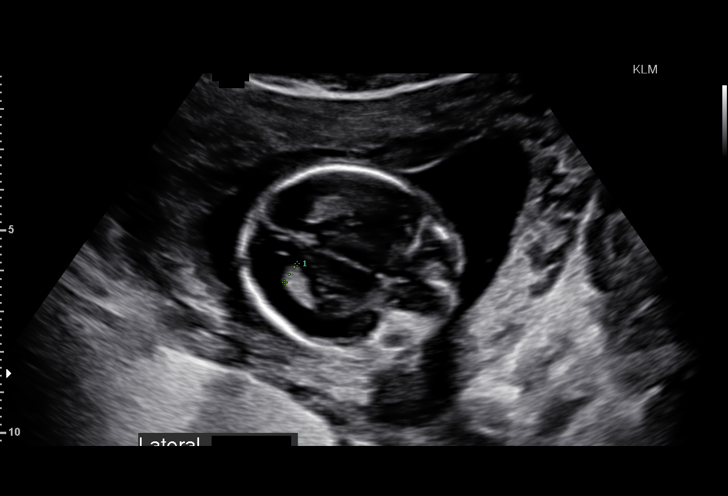
[im 20/75]
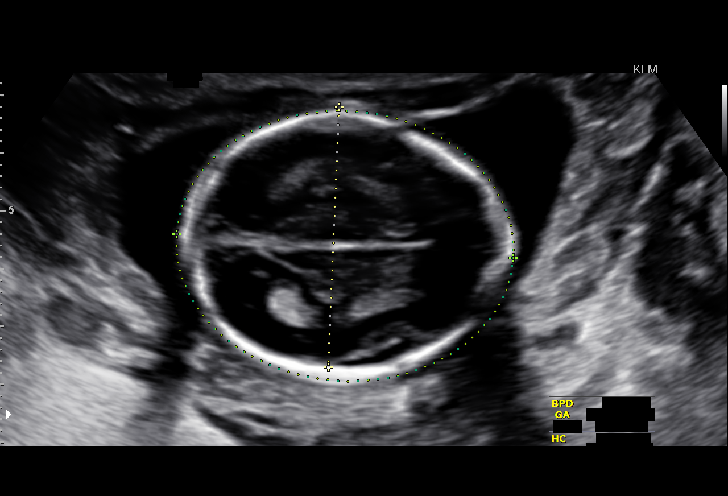
[im 25/75]
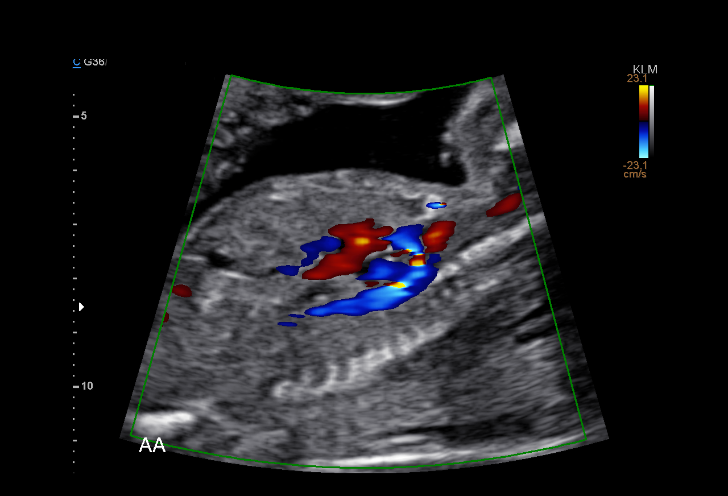
[im 31/75]
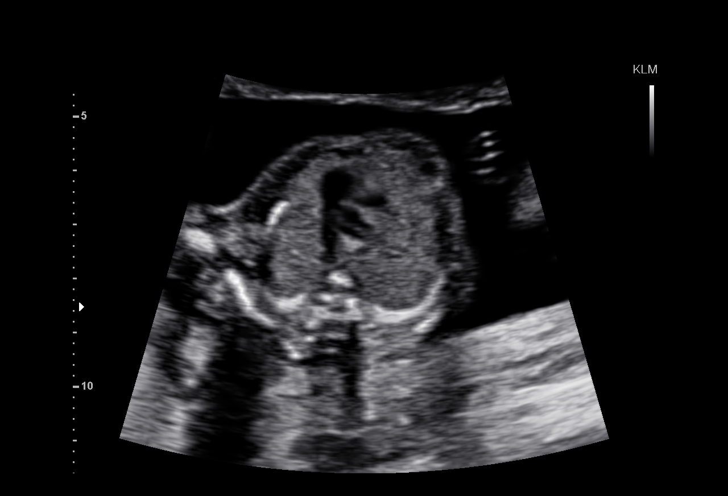
[im 36/75]
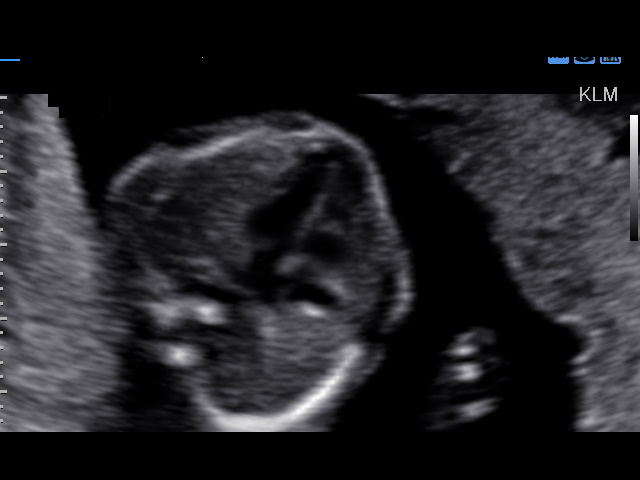
[im 42/75]
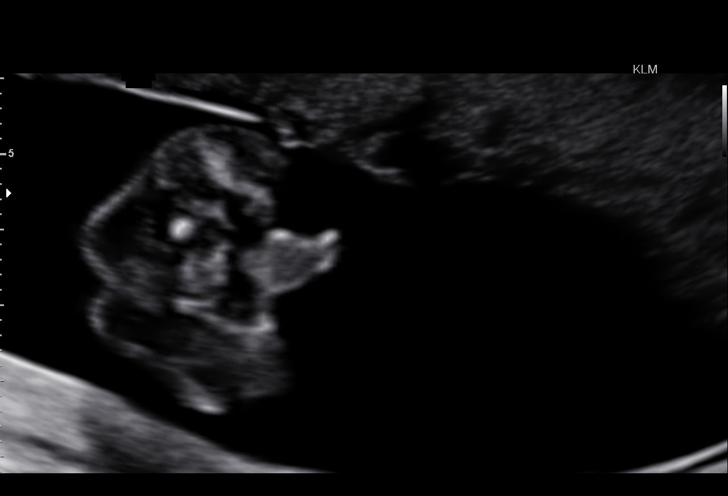
[im 47/75]
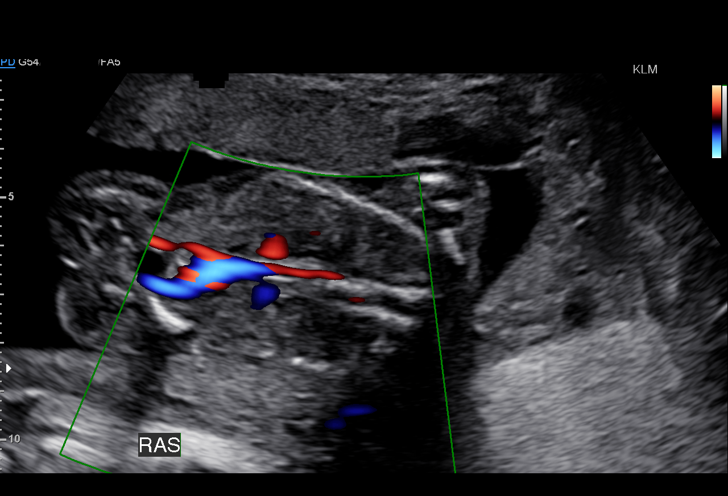
[im 53/75]
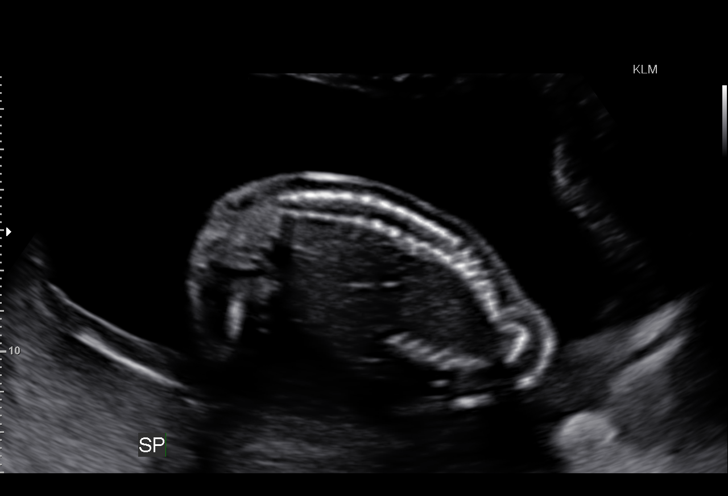
[im 58/75]
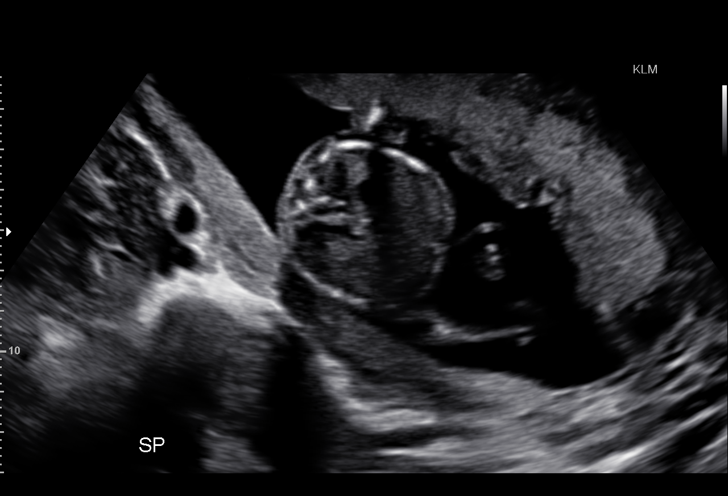
[im 64/75]
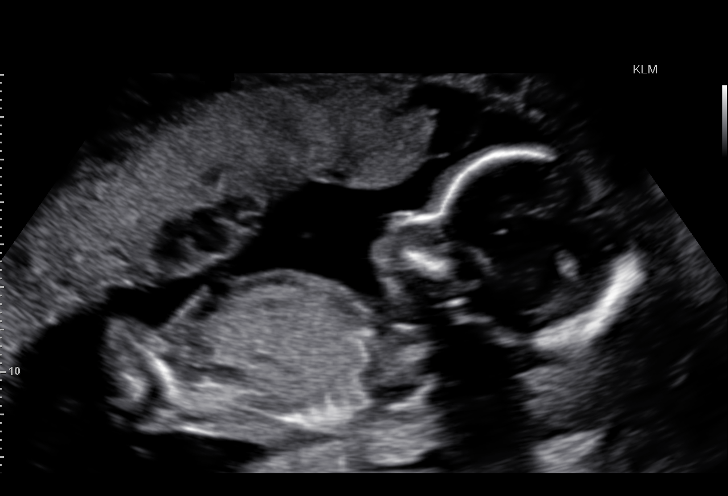
[im 69/75]
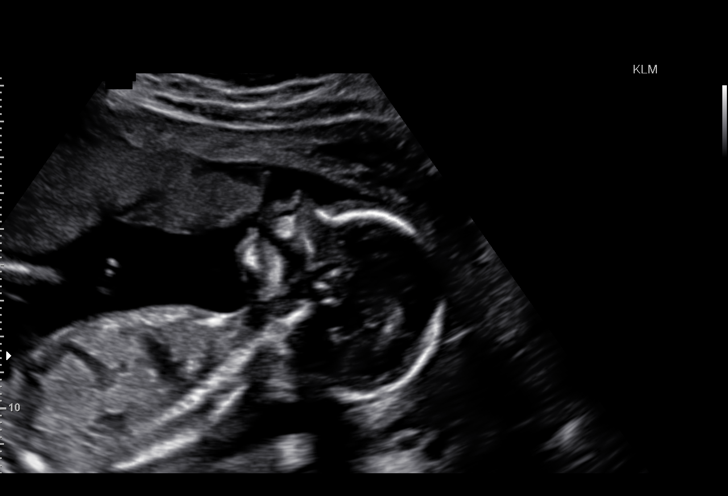
[im 75/75]
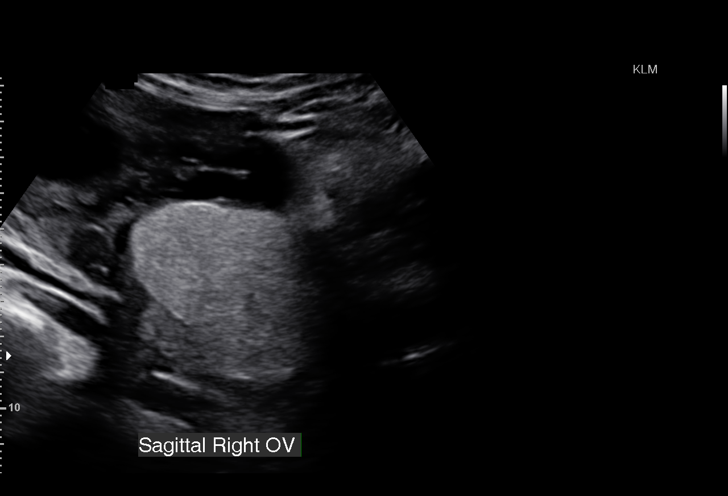

[14 of 28 positions shown; findings below may reference images not displayed]

1  JASH SOUN           [id number]      [id number]     [id number]
Indications

Basic anatomic survey                          Z36
19 weeks gestation of pregnancy
Poor obstetric history: Previous preterm
delivery, antepartum (36 weeks)
OB History

Gravidity:    9         Term:   4        Prem:   1        SAB:   0
TOP:          3       Ectopic:  0        Living: 4
Fetal Evaluation

Num Of Fetuses:     1
Fetal Heart         162
Rate(bpm):
Cardiac Activity:   Observed
Presentation:       Cephalic
Placenta:           Anterior, above cervical os
P. Cord Insertion:  Visualized, central

Amniotic Fluid
AFI FV:      Subjectively within normal limits
Larg Pckt:     3.4  cm
Biometry

BPD:      44.9  mm     G. Age:  19w 4d                  CI:        74.75   %   70 - 86
FL/HC:      18.3   %   16.1 -
HC:      164.8  mm     G. Age:  19w 1d         39  %    HC/AC:      1.15       1.09 -
AC:      143.5  mm     G. Age:  19w 5d         59  %    FL/BPD:     67.3   %
FL:       30.2  mm     G. Age:  19w 3d         45  %    FL/AC:      21.0   %   20 - 24
HUM:      29.8  mm     G. Age:  19w 6d         64  %
Est. FW:     296  gm    0 lb 10 oz      50  %
Gestational Age

LMP:           19w 2d       Date:   02/27/15                 EDD:   12/04/15
U/S Today:     19w 3d                                        EDD:   12/03/15
Best:          19w 2d    Det. By:   LMP  (02/27/15)          EDD:   12/04/15
Anatomy

Cranium:          Appears normal         Aortic Arch:      Appears normal
Fetal Cavum:      Appears normal         Ductal Arch:      Appears normal
Ventricles:       Appears normal         Diaphragm:        Appears normal
Choroid Plexus:   Appears normal         Stomach:          Appears normal, left
sided
Cerebellum:       Appears normal         Abdomen:          Appears normal
Posterior Fossa:  Appears normal         Abdominal Wall:   Appears nml (cord
insert, abd wall)
Nuchal Fold:      Appears normal         Cord Vessels:     Appears normal (3
vessel cord)
Face:             Appears normal         Kidneys:          Appear normal
(orbits and profile)
Lips:             Appears normal         Bladder:          Appears normal
Fetal Thoracic:   Appears normal         Spine:            Appears normal
Heart:            Appears normal         Upper             Appears normal
(4CH, axis, and        Extremities:
situs)
RVOT:             Appears normal         Lower             Appears normal
Extremities:
LVOT:             Appears normal

Other:  Fetus appears to be a male. Heels visualized. Nasal bone visualized.
Technically difficult due to fetal position.
Cervix Uterus Adnexa

Cervix
Normal appearance by transabdominal scan.

Uterus
No abnormality visualized.

Left Ovary
Within normal limits.

Right Ovary
Rt Dermoid measuring 5.0 x 4.9 x 3.8cm

Cul De Sac:   No free fluid seen.

Adnexa:       No abnormality visualized.
Impression

SIUP at 19+2 weeks
Normal detailed fetal anatomy
Markers of aneuploidy: none
Normal amniotic fluid volume
Measurements consistent with LMP dating
Right ovary: solid, homogeneous mass measuring 5 x 5 x 4
cms - c/w dermoid
Recommendations

Follow-up as clinically indicated

## 2016-12-12 ENCOUNTER — Ambulatory Visit (INDEPENDENT_AMBULATORY_CARE_PROVIDER_SITE_OTHER): Payer: Medicaid Other | Admitting: Certified Nurse Midwife

## 2016-12-12 ENCOUNTER — Other Ambulatory Visit (HOSPITAL_COMMUNITY)
Admission: RE | Admit: 2016-12-12 | Discharge: 2016-12-12 | Disposition: A | Discharge status: Home / Self Care | Payer: Medicaid Other | Source: Ambulatory Visit | Attending: Certified Nurse Midwife | Admitting: Certified Nurse Midwife

## 2016-12-12 ENCOUNTER — Encounter: Payer: Self-pay | Admitting: *Deleted

## 2016-12-12 ENCOUNTER — Encounter: Payer: Self-pay | Admitting: Certified Nurse Midwife

## 2016-12-12 VITALS — BP 103/63 | HR 83 | Wt 203.0 lb

## 2016-12-12 DIAGNOSIS — O0990 Supervision of high risk pregnancy, unspecified, unspecified trimester: Secondary | ICD-10-CM | POA: Insufficient documentation

## 2016-12-12 DIAGNOSIS — Z3A00 Weeks of gestation of pregnancy not specified: Secondary | ICD-10-CM | POA: Insufficient documentation

## 2016-12-12 DIAGNOSIS — O0992 Supervision of high risk pregnancy, unspecified, second trimester: Secondary | ICD-10-CM

## 2016-12-12 DIAGNOSIS — Z3401 Encounter for supervision of normal first pregnancy, first trimester: Secondary | ICD-10-CM

## 2016-12-12 DIAGNOSIS — O09219 Supervision of pregnancy with history of pre-term labor, unspecified trimester: Secondary | ICD-10-CM

## 2016-12-12 DIAGNOSIS — F331 Major depressive disorder, recurrent, moderate: Secondary | ICD-10-CM | POA: Insufficient documentation

## 2016-12-12 DIAGNOSIS — B009 Herpesviral infection, unspecified: Secondary | ICD-10-CM

## 2016-12-12 DIAGNOSIS — O09899 Supervision of other high risk pregnancies, unspecified trimester: Secondary | ICD-10-CM

## 2016-12-12 DIAGNOSIS — O09212 Supervision of pregnancy with history of pre-term labor, second trimester: Secondary | ICD-10-CM

## 2016-12-12 DIAGNOSIS — Z3480 Encounter for supervision of other normal pregnancy, unspecified trimester: Secondary | ICD-10-CM | POA: Insufficient documentation

## 2016-12-12 MED ORDER — PRENATE PIXIE 10-0.6-0.4-200 MG PO CAPS: 1.0000 | ORAL_CAPSULE | Freq: Every day | ORAL | 12 refills | Status: DC

## 2016-12-12 MED ORDER — VALACYCLOVIR HCL 1 G PO TABS: 1000.0000 mg | ORAL_TABLET | Freq: Two times a day (BID) | ORAL | 4 refills | Status: DC

## 2016-12-12 NOTE — Progress Notes (Signed)
Pt states she was previously told she has ovarian cyst, has been having pain on right side. Pt needs Rx for PNV and Valtrex. Pt would like to discuss medication for Mental health issues. Pt currently has been seen at [facility] and plans to make return appt.

## 2016-12-12 NOTE — Progress Notes (Signed)
Subjective:    Jennifer Cortez is being seen today for her first obstetrical visit.  This is not a planned pregnancy. She is at Unknown gestation. Her obstetrical history is significant for preterm birth, declines 17-P, grand multigravida, late to care at 23 weeks. Relationship with FOB: significant other, not living together. Patient does intend to breast feed. Pregnancy history fully reviewed.  Employed at [employer] home [employer].   The information documented in the HPI was reviewed and verified.  Menstrual History: OB History    Gravida Para Term Preterm AB Living   11 5 3 2 4 5    SAB TAB Ectopic Multiple Live Births   1 3 0 0 5       Patient's last menstrual period was 06/30/2016.    Past Medical History:  Diagnosis Date  . Anxiety   . Depression   . Dermoid cyst of right ovary     5 cm dermoid in right ovary -> plan laparoscopic removal after delivery.  [place] stones   . HSV (herpes simplex virus) infection     Past Surgical History:  Procedure Laterality Date  . DILATION AND CURETTAGE OF UTERUS    . gall stones    . MULTIPLE TOOTH EXTRACTIONS       (Not in a hospital admission) No Known Allergies  Social History  Substance Use Topics  . Smoking status: Former Smoker    Packs/day: 0.25    Types: Cigarettes    Quit date: 11/25/2013  . Smokeless tobacco: Never Used  . Alcohol use Yes     Comment: social, not while pregnant    Family History  Problem Relation Age of Onset  . Diabetes Maternal Grandmother   . Hepatitis B Maternal Grandmother      Review of Systems Constitutional: negative for weight loss Gastrointestinal: negative for vomiting Genitourinary:negative for genital lesions and vaginal discharge and dysuria Musculoskeletal:negative for back pain Behavioral/Psych: negative for abusive relationship, depression, illegal drug usage and tobacco use    Objective:    BP 103/63   Pulse 83   Wt 203 lb (92.1 kg)   LMP 06/30/2016   Breastfeeding?  Unknown   BMI 32.77 kg/m  General Appearance:    Alert, cooperative, no distress, appears stated age  Head:    Normocephalic, without obvious abnormality, atraumatic  Eyes:    PERRL, conjunctiva/corneas clear, EOM's intact, fundi    benign, both eyes  Ears:    Normal TM's and external ear canals, both ears  Nose:   Nares normal, septum midline, mucosa normal, no drainage    or sinus tenderness  Throat:   Lips, mucosa, and tongue normal; teeth and gums normal  Neck:   Supple, symmetrical, trachea midline, no adenopathy;    thyroid:  no enlargement/tenderness/nodules; no carotid   bruit or JVD  Back:     Symmetric, no curvature, ROM normal, no CVA tenderness  Lungs:     Clear to auscultation bilaterally, respirations unlabored  Chest Wall:    No tenderness or deformity   Heart:    Regular rate and rhythm, S1 and S2 normal, no murmur, rub   or gallop  Breast Exam:    No tenderness, masses, or nipple abnormality  Abdomen:     Soft, non-tender, bowel sounds active all four quadrants,    no masses, no organomegaly  Genitalia:    Normal female without lesion, discharge or tenderness  Extremities:   Extremities normal, atraumatic, no cyanosis or edema  Pulses:  2+ and symmetric all extremities  Skin:   Skin color, texture, turgor normal, no rashes or lesions  Lymph nodes:   Cervical, supraclavicular, and axillary nodes normal  Neurologic:   CNII-XII intact, normal strength, sensation and reflexes    throughout         Cervix: long, thick, closed and posterior.  FHR: 145 by doppler.  FH: 23 cm    Lab Review Urine pregnancy test Labs reviewed yes Radiologic studies reviewed no  Assessment & plan    Pregnancy at 23.4 weeks    1. History of preterm delivery, currently pregnant     - [place] MFM OB DETAIL +14 WK; Future  2. Supervision of high risk pregnancy, antepartum     Letter composed for her employer.  - Prenat-FeAsp-Meth-FA-DHA w/o A (PRENATE PIXIE) 10-0.6-0.4-200 MG CAPS; Take 1  tablet by mouth daily.  Dispense: 30 capsule; Refill: 12 - Hemoglobinopathy evaluation - Varicella zoster antibody, IgG - Culture, OB Urine - Hemoglobin A1c - Obstetric Panel, Including HIV - Cystic Fibrosis Mutation 97 - [place] MFM OB DETAIL +14 WK; Future - Cervicovaginal ancillary only - Cytology - PAP  3. Herpes    - valACYclovir (VALTREX) 1000 MG tablet; Take 1 tablet (1,000 mg total) by mouth 2 (two) times daily.  Dispense: 60 tablet; Refill: 4  4. Moderate episode of recurrent major depressive disorder ([place])    - Ambulatory referral to Psychology     Prenatal vitamins.  Counseling provided regarding continued use of seat belts, cessation of alcohol consumption, smoking or use of illicit drugs; infection precautions i.e., influenza/TDAP immunizations, toxoplasmosis,CMV, parvovirus, listeria and varicella; workplace safety, exercise during pregnancy; routine dental care, safe medications, sexual activity, hot tubs, saunas, pools, travel, caffeine use, fish and methlymercury, potential toxins, hair treatments, varicose veins Weight gain recommendations per IOM guidelines reviewed: underweight/BMI< 18.5--> gain 28 - 40 lbs; normal weight/BMI 18.5 - 24.9--> gain 25 - 35 lbs; overweight/BMI 25 - 29.9--> gain 15 - 25 lbs; obese/BMI >30->gain  11 - 20 lbs Problem list reviewed and updated. FIRST/CF mutation testing/NIPT/QUAD SCREEN/fragile X/Ashkenazi Jewish population testing/Spinal muscular atrophy discussed: late to care. Role of ultrasound in pregnancy discussed; fetal survey: ordered. Amniocentesis discussed: not indicated.  Meds ordered this encounter  Medications  . Prenat-FeAsp-Meth-FA-DHA w/o A (PRENATE PIXIE) 10-0.6-0.4-200 MG CAPS    Sig: Take 1 tablet by mouth daily.    Dispense:  30 capsule    Refill:  12    Please process coupon: Rx BIN: [id number], RxPCN: [id number], RxGRP: [id number], RxID: [id number]  SUF: 01  . valACYclovir (VALTREX) 1000 MG tablet    Sig: Take 1 tablet  (1,000 mg total) by mouth 2 (two) times daily.    Dispense:  60 tablet    Refill:  4   Orders Placed This Encounter  Procedures  . Culture, OB Urine  . [place] MFM OB DETAIL +14 WK    Standing Status:   Future    Standing Expiration Date:   02/11/2018    Order Specific Question:   Reason for Exam (SYMPTOM  OR DIAGNOSIS REQUIRED)    Answer:   fetal anatomy scan, hx of dermoid cyst, hx of multiple PTB refused 17-P    Order Specific Question:   Preferred imaging location?    Answer:   [facility]  . Hemoglobinopathy evaluation  . Varicella zoster antibody, IgG  . Hemoglobin A1c  . Obstetric Panel, Including HIV  . Cystic Fibrosis Mutation 97  . Ambulatory referral to Psychology  Referral Priority:   Routine    Referral Type:   Psychiatric    Referral Reason:   Specialty Services Required    Requested Specialty:   Psychology    Number of Visits Requested:   1    Follow up in 2 weeks. 50% of 30 min visit spent on counseling and coordination of care.

## 2016-12-14 ENCOUNTER — Other Ambulatory Visit: Payer: Self-pay | Admitting: Certified Nurse Midwife

## 2016-12-14 DIAGNOSIS — O0990 Supervision of high risk pregnancy, unspecified, unspecified trimester: Secondary | ICD-10-CM

## 2016-12-14 LAB — CERVICOVAGINAL ANCILLARY ONLY
Bacterial vaginitis: NEGATIVE
Candida vaginitis: NEGATIVE
Chlamydia: NEGATIVE
Neisseria Gonorrhea: NEGATIVE
Trichomonas: NEGATIVE

## 2016-12-14 LAB — OBSTETRIC PANEL, INCLUDING HIV
Antibody Screen: NEGATIVE
Basophils Absolute: 0 10*3/uL (ref 0.0–0.2)
Basos: 0 %
EOS (ABSOLUTE): 0.1 10*3/uL (ref 0.0–0.4)
Eos: 1 %
HIV Screen 4th Generation wRfx: NONREACTIVE
Hematocrit: 34 % (ref 34.0–46.6)
Hemoglobin: 11.5 g/dL (ref 11.1–15.9)
Hepatitis B Surface Ag: NEGATIVE
Immature Grans (Abs): 0 10*3/uL (ref 0.0–0.1)
Immature Granulocytes: 0 %
Lymphocytes Absolute: 2 10*3/uL (ref 0.7–3.1)
Lymphs: 25 %
MCH: 32.1 pg (ref 26.6–33.0)
MCHC: 33.8 g/dL (ref 31.5–35.7)
MCV: 95 fL (ref 79–97)
Monocytes Absolute: 0.4 10*3/uL (ref 0.1–0.9)
Monocytes: 5 %
Neutrophils Absolute: 5.4 10*3/uL (ref 1.4–7.0)
Neutrophils: 69 %
Platelets: 347 10*3/uL (ref 150–379)
RBC: 3.58 x10E6/uL — ABNORMAL LOW (ref 3.77–5.28)
RDW: 14.4 % (ref 12.3–15.4)
RPR Ser Ql: NONREACTIVE
Rh Factor: POSITIVE
Rubella Antibodies, IGG: 1.34 index (ref >0.99)
WBC: 7.9 10*3/uL (ref 3.4–10.8)

## 2016-12-14 LAB — URINE CULTURE, OB REFLEX: Organism ID, Bacteria: NO GROWTH

## 2016-12-14 LAB — CULTURE, OB URINE

## 2016-12-14 LAB — HEMOGLOBIN A1C
Est. average glucose Bld gHb Est-mCnc: 88 mg/dL
Hgb A1c MFr Bld: 4.7 % — ABNORMAL LOW (ref 4.8–5.6)

## 2016-12-14 LAB — HEMOGLOBINOPATHY EVALUATION
HGB C: 0 %
HGB S: 0 %
HGB VARIANT: 0 %
Hemoglobin A2 Quantitation: 2.7 % (ref 1.8–3.2)
Hemoglobin F Quantitation: 0 % (ref 0.0–2.0)
Hgb A: 97.3 % (ref 96.4–98.8)

## 2016-12-14 LAB — VARICELLA ZOSTER ANTIBODY, IGG: Varicella zoster IgG: 1618 index (ref >165)

## 2016-12-15 LAB — CYTOLOGY - PAP
Adequacy: ABSENT
Diagnosis: NEGATIVE
HPV 16/18/45 genotyping: NEGATIVE
HPV: DETECTED — AB

## 2016-12-18 ENCOUNTER — Other Ambulatory Visit: Payer: Self-pay | Admitting: Certified Nurse Midwife

## 2016-12-18 DIAGNOSIS — B977 Papillomavirus as the cause of diseases classified elsewhere: Secondary | ICD-10-CM | POA: Insufficient documentation

## 2016-12-18 DIAGNOSIS — O0990 Supervision of high risk pregnancy, unspecified, unspecified trimester: Secondary | ICD-10-CM

## 2016-12-19 ENCOUNTER — Other Ambulatory Visit: Payer: Self-pay | Admitting: Certified Nurse Midwife

## 2016-12-19 ENCOUNTER — Telehealth: Payer: Self-pay

## 2016-12-19 DIAGNOSIS — O0990 Supervision of high risk pregnancy, unspecified, unspecified trimester: Secondary | ICD-10-CM

## 2016-12-19 LAB — CYSTIC FIBROSIS MUTATION 97: Interpretation: NOT DETECTED

## 2016-12-19 NOTE — Telephone Encounter (Signed)
Attempted to contact patient in order to provide her with information about referral that was placed for her.  Will inform patient that she does not need a referral to be seen by [facility].  She can just contact them and they will schedule an appointment at that time.

## 2016-12-22 ENCOUNTER — Ambulatory Visit (HOSPITAL_COMMUNITY)
Admission: RE | Admit: 2016-12-22 | Discharge: 2016-12-22 | Disposition: A | Discharge status: Home / Self Care | Payer: Medicaid Other | Source: Ambulatory Visit | Attending: Certified Nurse Midwife | Admitting: Certified Nurse Midwife

## 2016-12-22 ENCOUNTER — Encounter (HOSPITAL_COMMUNITY): Payer: Self-pay

## 2016-12-22 DIAGNOSIS — D279 Benign neoplasm of unspecified ovary: Secondary | ICD-10-CM | POA: Diagnosis not present

## 2016-12-22 DIAGNOSIS — O09219 Supervision of pregnancy with history of pre-term labor, unspecified trimester: Secondary | ICD-10-CM

## 2016-12-22 DIAGNOSIS — O3482 Maternal care for other abnormalities of pelvic organs, second trimester: Secondary | ICD-10-CM | POA: Diagnosis present

## 2016-12-22 DIAGNOSIS — Z3A25 25 weeks gestation of pregnancy: Secondary | ICD-10-CM | POA: Insufficient documentation

## 2016-12-22 DIAGNOSIS — O09212 Supervision of pregnancy with history of pre-term labor, second trimester: Secondary | ICD-10-CM | POA: Insufficient documentation

## 2016-12-22 DIAGNOSIS — O09899 Supervision of other high risk pregnancies, unspecified trimester: Secondary | ICD-10-CM

## 2016-12-22 DIAGNOSIS — O0932 Supervision of pregnancy with insufficient antenatal care, second trimester: Secondary | ICD-10-CM | POA: Insufficient documentation

## 2016-12-22 DIAGNOSIS — O0990 Supervision of high risk pregnancy, unspecified, unspecified trimester: Secondary | ICD-10-CM

## 2016-12-22 DIAGNOSIS — Z3689 Encounter for other specified antenatal screening: Secondary | ICD-10-CM | POA: Insufficient documentation

## 2016-12-22 DIAGNOSIS — D270 Benign neoplasm of right ovary: Secondary | ICD-10-CM

## 2016-12-25 ENCOUNTER — Other Ambulatory Visit (HOSPITAL_COMMUNITY): Payer: Self-pay | Admitting: *Deleted

## 2016-12-25 DIAGNOSIS — Z0489 Encounter for examination and observation for other specified reasons: Secondary | ICD-10-CM

## 2016-12-25 DIAGNOSIS — IMO0002 Reserved for concepts with insufficient information to code with codable children: Secondary | ICD-10-CM

## 2016-12-26 ENCOUNTER — Encounter: Payer: Self-pay | Admitting: Obstetrics and Gynecology

## 2016-12-26 ENCOUNTER — Ambulatory Visit (INDEPENDENT_AMBULATORY_CARE_PROVIDER_SITE_OTHER): Payer: Medicaid Other | Admitting: Obstetrics and Gynecology

## 2016-12-26 VITALS — BP 118/74 | HR 109 | Wt 206.0 lb

## 2016-12-26 DIAGNOSIS — O0990 Supervision of high risk pregnancy, unspecified, unspecified trimester: Secondary | ICD-10-CM

## 2016-12-26 DIAGNOSIS — O09219 Supervision of pregnancy with history of pre-term labor, unspecified trimester: Secondary | ICD-10-CM

## 2016-12-26 DIAGNOSIS — O09899 Supervision of other high risk pregnancies, unspecified trimester: Secondary | ICD-10-CM

## 2016-12-26 MED ORDER — PROGESTERONE MICRONIZED 200 MG PO CAPS: ORAL_CAPSULE | ORAL | 3 refills | Status: DC

## 2016-12-26 NOTE — Progress Notes (Signed)
Subjective:  Jennifer Cortez is a 31 y.o. [id number] at [redacted]w[redacted]d being seen today for ongoing prenatal care.  She is currently monitored for the following issues for this high-risk pregnancy and has Dermoid cyst of ovary; Supervision of high risk pregnancy, antepartum; History of preterm delivery, currently pregnant; Herpes; Moderate episode of recurrent major depressive disorder ([place]); and HPV in female on her problem list.  Patient reports no complaints.  Contractions: Not present. Vag. Bleeding: None.  Movement: Present. Denies leaking of fluid.   The following portions of the patient's history were reviewed and updated as appropriate: allergies, current medications, past family history, past medical history, past social history, past surgical history and problem list. Problem list updated.  Objective:   Vitals:   12/26/16 1558  BP: 118/74  Pulse: (!) 109  Weight: 206 lb (93.4 kg)    Fetal Status: Fetal Heart Rate (bpm): 158   Movement: Present     General:  Alert, oriented and cooperative. Patient is in no acute distress.  Skin: Skin is warm and dry. No rash noted.   Cardiovascular: Normal heart rate noted  Respiratory: Normal respiratory effort, no problems with respiration noted  Abdomen: Soft, gravid, appropriate for gestational age. Pain/Pressure: Absent     Pelvic:  Cervical exam deferred        Extremities: Normal range of motion.  Edema: None  Mental Status: Normal mood and affect. Normal behavior. Normal judgment and thought content.   Urinalysis:      Assessment and Plan:  Pregnancy: [id number] at [redacted]w[redacted]d  1. Supervision of high risk pregnancy, antepartum Stable Glucola next visit  2. History of preterm delivery, currently pregnant Discussed with pt. She declines 17 OHP but is amendable to vaginal Prometrium. U/B reviewed with pt - progesterone (PROMETRIUM) 200 MG capsule; Place one capsule vaginally at bedtime  Dispense: 30 capsule; Refill: 3  Preterm labor symptoms and  general obstetric precautions including but not limited to vaginal bleeding, contractions, leaking of fluid and fetal movement were reviewed in detail with the patient. Please refer to After Visit Summary for other counseling recommendations.  Return in about 3 weeks (around 01/16/2017) for OB visit.   Chancy Milroy, MD

## 2016-12-27 ENCOUNTER — Other Ambulatory Visit: Payer: Self-pay | Admitting: Certified Nurse Midwife

## 2016-12-27 DIAGNOSIS — O0990 Supervision of high risk pregnancy, unspecified, unspecified trimester: Secondary | ICD-10-CM

## 2017-01-16 ENCOUNTER — Ambulatory Visit (INDEPENDENT_AMBULATORY_CARE_PROVIDER_SITE_OTHER): Payer: Medicaid Other | Admitting: Obstetrics & Gynecology

## 2017-01-16 ENCOUNTER — Other Ambulatory Visit: Payer: Medicaid Other

## 2017-01-16 VITALS — BP 105/69 | HR 91 | Wt 204.0 lb

## 2017-01-16 DIAGNOSIS — O09213 Supervision of pregnancy with history of pre-term labor, third trimester: Secondary | ICD-10-CM

## 2017-01-16 DIAGNOSIS — O09219 Supervision of pregnancy with history of pre-term labor, unspecified trimester: Secondary | ICD-10-CM

## 2017-01-16 DIAGNOSIS — O0993 Supervision of high risk pregnancy, unspecified, third trimester: Secondary | ICD-10-CM

## 2017-01-16 DIAGNOSIS — O0990 Supervision of high risk pregnancy, unspecified, unspecified trimester: Secondary | ICD-10-CM

## 2017-01-16 DIAGNOSIS — Z23 Encounter for immunization: Secondary | ICD-10-CM

## 2017-01-16 DIAGNOSIS — O09899 Supervision of other high risk pregnancies, unspecified trimester: Secondary | ICD-10-CM

## 2017-01-16 NOTE — Patient Instructions (Signed)
Postpartum Tubal Ligation Postpartum tubal ligation (PPTL) is a procedure to close the fallopian tubes. This is done so that you cannot get pregnant. When the fallopian tubes are closed, the eggs that the ovaries release cannot enter the uterus, and sperm cannot reach the eggs. PPTL is done right after childbirth or 1-2 days after childbirth, before the uterus returns to its normal location. PPTL is sometimes called "getting your tubes tied." You should not have this procedure if you want to get pregnant someday or if you are unsure about having more children. Tell a health care provider about:  Any allergies you have.  All medicines you are taking, including vitamins, herbs, eye drops, creams, and over-the-counter medicines.  Previous problems you or members of your family have had with the use of anesthetics.  Any blood disorders you have.  Previous surgeries you have had.  Any medical conditions you may have.  Any past pregnancies. What are the risks? Generally, this is a safe procedure. However, problems may occur, including:  Infection.  Bleeding.  Injury to surrounding organs.  Side effects from anesthetics.  Failure of the procedure.  This procedure can increase your risk of a kind of pregnancy in which a fertilized egg attaches to the outside of the uterus (ectopic pregnancy). What happens before the procedure?  Ask your health care provider about: ? How much pain you can expect to have. ? What medicines you will be given for pain, especially if you are planning to breastfeed.  Follow instructions from your health care provider about eating and drinking restrictions. What happens during the procedure? If you had a vaginal delivery:  You may be given one or more of the following: ? A medicine that helps you relax (sedative). ? A medicine to numb the area (local anesthetic). ? A medicine to make you fall asleep (general anesthetic). ? A medicine that is injected  into an area of your body to numb everything below the injection site (regional anesthetic).  If you have been given a general anesthetic, a tube will be put down your throat to help you breathe.  An IV tube will be inserted into one of your veins to give you medicines and fluids during the procedure.  Your bladder may be emptied with a small tube (catheter).  An incision will be made just below your belly button.  Your fallopian tubes will be located and brought up through the incision.  Your fallopian tubes will be tied off, burned (cauterized), or blocked with a clip, ring, or clamp. A small portion in the center of each fallopian tube may be removed.  The incision will be closed with stitches (sutures).  A bandage (dressing) will be placed over the incision.  If you had a cesarean delivery:  Tubal ligation will be done through the incision that was used for the cesarean delivery of your baby.  The incision will be closed with sutures.  A dressing will be placed over the incision.  The procedure may vary among health care providers and hospitals. What happens after the procedure?  Your blood pressure, heart rate, breathing rate, and blood oxygen level will be monitored often until the medicines you were given have worn off.  You will be given pain medicine as needed.  Do not drive for 24 hours if you received a sedative. This information is not intended to replace advice given to you by your health care provider. Make sure you discuss any questions you have with your health  care provider. Document Released: 04/17/2005 Document Revised: 09/20/2015 Document Reviewed: 03/28/2015 Elsevier Interactive Patient Education  2018 [place] of Pregnancy The third trimester is from week 29 through week 42, months 7 through 9. This trimester is when your unborn baby (fetus) is growing very fast. At the end of the ninth month, the unborn baby is about 20 inches in  length. It weighs about 6-10 pounds. Follow these instructions at home:  Avoid all smoking, herbs, and alcohol. Avoid drugs not approved by your doctor.  Do not use any tobacco products, including cigarettes, chewing tobacco, and electronic cigarettes. If you need help quitting, ask your doctor. You may get counseling or other support to help you quit.  Only take medicine as told by your doctor. Some medicines are safe and some are not during pregnancy.  Exercise only as told by your doctor. Stop exercising if you start having cramps.  Eat regular, healthy meals.  Wear a good support bra if your breasts are tender.  Do not use hot tubs, steam rooms, or saunas.  Wear your seat belt when driving.  Avoid raw meat, uncooked cheese, and liter boxes and soil used by cats.  Take your prenatal vitamins.  Take 1500-2000 milligrams of calcium daily starting at the 20th week of pregnancy until you deliver your baby.  Try taking medicine that helps you poop (stool softener) as needed, and if your doctor approves. Eat more fiber by eating fresh fruit, vegetables, and whole grains. Drink enough fluids to keep your pee (urine) clear or pale yellow.  Take warm water baths (sitz baths) to soothe pain or discomfort caused by hemorrhoids. Use hemorrhoid cream if your doctor approves.  If you have puffy, bulging veins (varicose veins), wear support hose. Raise (elevate) your feet for 15 minutes, 3-4 times a day. Limit salt in your diet.  Avoid heavy lifting, wear low heels, and sit up straight.  Rest with your legs raised if you have leg cramps or low back pain.  Visit your dentist if you have not gone during your pregnancy. Use a soft toothbrush to brush your teeth. Be gentle when you floss.  You can have sex (intercourse) unless your doctor tells you not to.  Do not travel far distances unless you must. Only do so with your doctor's approval.  Take prenatal classes.  Practice driving to the  hospital.  Pack your hospital bag.  Prepare the baby's room.  Go to your doctor visits. Get help if:  You are not sure if you are in labor or if your water has broken.  You are dizzy.  You have mild cramps or pressure in your lower belly (abdominal).  You have a nagging pain in your belly area.  You continue to feel sick to your stomach (nauseous), throw up (vomit), or have watery poop (diarrhea).  You have bad smelling fluid coming from your vagina.  You have pain with peeing (urination). Get help right away if:  You have a fever.  You are leaking fluid from your vagina.  You are spotting or bleeding from your vagina.  You have severe belly cramping or pain.  You lose or gain weight rapidly.  You have trouble catching your breath and have chest pain.  You notice sudden or extreme puffiness (swelling) of your face, hands, ankles, feet, or legs.  You have not felt the baby move in over an hour.  You have severe headaches that do not go away with medicine.  You  have vision changes. This information is not intended to replace advice given to you by your health care provider. Make sure you discuss any questions you have with your health care provider. Document Released: 07/12/2009 Document Revised: 09/23/2015 Document Reviewed: 06/18/2012 Elsevier Interactive Patient Education  2017 [employer].

## 2017-01-16 NOTE — Progress Notes (Signed)
   PRENATAL VISIT NOTE  Subjective:  Jennifer Cortez is a 31 y.o. [id number] at [redacted]w[redacted]d being seen today for ongoing prenatal care.  She is currently monitored for the following issues for this high-risk pregnancy and has Dermoid cyst of ovary; Supervision of high risk pregnancy, antepartum; History of preterm delivery, currently pregnant; Herpes; Moderate episode of recurrent major depressive disorder ([place]); and HPV in female on her problem list.  Patient reports no complaints.  Contractions: Irregular. Vag. Bleeding: None.  Movement: Present. Denies leaking of fluid.   The following portions of the patient's history were reviewed and updated as appropriate: allergies, current medications, past family history, past medical history, past social history, past surgical history and problem list. Problem list updated.  Objective:   Vitals:   01/16/17 0904  BP: 105/69  Pulse: 91  Weight: 204 lb (92.5 kg)    Fetal Status:   Fundal Height: 28 cm Movement: Present     General:  Alert, oriented and cooperative. Patient is in no acute distress.  Skin: Skin is warm and dry. No rash noted.   Cardiovascular: Normal heart rate noted  Respiratory: Normal respiratory effort, no problems with respiration noted  Abdomen: Soft, gravid, appropriate for gestational age.  Pain/Pressure: Present     Pelvic: Cervical exam deferred        Extremities: Normal range of motion.  Edema: None  Mental Status:  Normal mood and affect. Normal behavior. Normal judgment and thought content.   Assessment and Plan:  Pregnancy: [id number] at [redacted]w[redacted]d  1. Supervision of high risk pregnancy, antepartum Routine 28 week labs  2. History of preterm delivery, currently pregnant No s/sx PTL  Preterm labor symptoms and general obstetric precautions including but not limited to vaginal bleeding, contractions, leaking of fluid and fetal movement were reviewed in detail with the patient. Please refer to After Visit Summary for other  counseling recommendations.  Return in about 2 weeks (around 01/30/2017).   Emeterio Reeve, MD

## 2017-01-17 LAB — GLUCOSE TOLERANCE, 2 HOURS W/ 1HR
Glucose, 1 hour: 72 mg/dL (ref 65–179)
Glucose, 2 hour: 101 mg/dL (ref 65–152)
Glucose, Fasting: 68 mg/dL (ref 65–91)

## 2017-01-17 LAB — HIV ANTIBODY (ROUTINE TESTING W REFLEX): HIV Screen 4th Generation wRfx: NONREACTIVE

## 2017-01-17 LAB — CBC
Hematocrit: 32.9 % — ABNORMAL LOW (ref 34.0–46.6)
Hemoglobin: 10.9 g/dL — ABNORMAL LOW (ref 11.1–15.9)
MCH: 31.9 pg (ref 26.6–33.0)
MCHC: 33.1 g/dL (ref 31.5–35.7)
MCV: 96 fL (ref 79–97)
Platelets: 289 10*3/uL (ref 150–379)
RBC: 3.42 x10E6/uL — ABNORMAL LOW (ref 3.77–5.28)
RDW: 13.8 % (ref 12.3–15.4)
WBC: 8.2 10*3/uL (ref 3.4–10.8)

## 2017-01-17 LAB — RPR: RPR Ser Ql: NONREACTIVE

## 2017-01-30 ENCOUNTER — Encounter: Payer: Self-pay | Admitting: Obstetrics & Gynecology

## 2017-02-02 ENCOUNTER — Ambulatory Visit (HOSPITAL_COMMUNITY)
Admission: RE | Admit: 2017-02-02 | Discharge: 2017-02-02 | Disposition: A | Discharge status: Home / Self Care | Payer: Medicaid Other | Source: Ambulatory Visit | Attending: Certified Nurse Midwife | Admitting: Certified Nurse Midwife

## 2017-02-02 ENCOUNTER — Other Ambulatory Visit: Payer: Self-pay | Admitting: Certified Nurse Midwife

## 2017-02-02 DIAGNOSIS — D259 Leiomyoma of uterus, unspecified: Secondary | ICD-10-CM | POA: Insufficient documentation

## 2017-02-02 DIAGNOSIS — O3480 Maternal care for other abnormalities of pelvic organs, unspecified trimester: Secondary | ICD-10-CM

## 2017-02-02 DIAGNOSIS — O0933 Supervision of pregnancy with insufficient antenatal care, third trimester: Secondary | ICD-10-CM | POA: Insufficient documentation

## 2017-02-02 DIAGNOSIS — O3483 Maternal care for other abnormalities of pelvic organs, third trimester: Secondary | ICD-10-CM | POA: Insufficient documentation

## 2017-02-02 DIAGNOSIS — Z3A31 31 weeks gestation of pregnancy: Secondary | ICD-10-CM

## 2017-02-02 DIAGNOSIS — D4959 Neoplasm of unspecified behavior of other genitourinary organ: Secondary | ICD-10-CM

## 2017-02-02 DIAGNOSIS — O09213 Supervision of pregnancy with history of pre-term labor, third trimester: Secondary | ICD-10-CM | POA: Diagnosis not present

## 2017-02-02 DIAGNOSIS — O0990 Supervision of high risk pregnancy, unspecified, unspecified trimester: Secondary | ICD-10-CM

## 2017-02-05 ENCOUNTER — Other Ambulatory Visit: Payer: Self-pay | Admitting: Certified Nurse Midwife

## 2017-02-05 DIAGNOSIS — D270 Benign neoplasm of right ovary: Secondary | ICD-10-CM

## 2017-02-05 DIAGNOSIS — O0990 Supervision of high risk pregnancy, unspecified, unspecified trimester: Secondary | ICD-10-CM

## 2017-02-14 ENCOUNTER — Encounter: Payer: Self-pay | Admitting: Obstetrics and Gynecology

## 2017-02-20 ENCOUNTER — Encounter (HOSPITAL_COMMUNITY): Payer: Self-pay | Admitting: *Deleted

## 2017-02-20 ENCOUNTER — Inpatient Hospital Stay (HOSPITAL_COMMUNITY): Payer: Medicaid Other | Admitting: Anesthesiology

## 2017-02-20 ENCOUNTER — Encounter (HOSPITAL_COMMUNITY)
Admission: AD | Disposition: A | Discharge status: Home / Self Care | Payer: Self-pay | Source: Ambulatory Visit | Attending: Family Medicine

## 2017-02-20 ENCOUNTER — Inpatient Hospital Stay (HOSPITAL_COMMUNITY)
Admission: AD | Admit: 2017-02-20 | Discharge: 2017-02-23 | DRG: 783 | Disposition: A | Discharge status: Home / Self Care | Payer: Medicaid Other | Source: Ambulatory Visit | Attending: Family Medicine | Admitting: Family Medicine

## 2017-02-20 DIAGNOSIS — Z302 Encounter for sterilization: Secondary | ICD-10-CM | POA: Diagnosis not present

## 2017-02-20 DIAGNOSIS — O3483 Maternal care for other abnormalities of pelvic organs, third trimester: Secondary | ICD-10-CM | POA: Diagnosis present

## 2017-02-20 DIAGNOSIS — N83201 Unspecified ovarian cyst, right side: Secondary | ICD-10-CM | POA: Diagnosis present

## 2017-02-20 DIAGNOSIS — D649 Anemia, unspecified: Secondary | ICD-10-CM | POA: Diagnosis present

## 2017-02-20 DIAGNOSIS — Z87891 Personal history of nicotine dependence: Secondary | ICD-10-CM

## 2017-02-20 DIAGNOSIS — O9832 Other infections with a predominantly sexual mode of transmission complicating childbirth: Principal | ICD-10-CM | POA: Diagnosis present

## 2017-02-20 DIAGNOSIS — Z98891 History of uterine scar from previous surgery: Secondary | ICD-10-CM

## 2017-02-20 DIAGNOSIS — A6000 Herpesviral infection of urogenital system, unspecified: Secondary | ICD-10-CM | POA: Diagnosis present

## 2017-02-20 DIAGNOSIS — Z3A33 33 weeks gestation of pregnancy: Secondary | ICD-10-CM

## 2017-02-20 DIAGNOSIS — O9902 Anemia complicating childbirth: Secondary | ICD-10-CM | POA: Diagnosis present

## 2017-02-20 DIAGNOSIS — O9852 Other viral diseases complicating childbirth: Secondary | ICD-10-CM | POA: Diagnosis not present

## 2017-02-20 HISTORY — DX: Bipolar disorder, unspecified: F31.9

## 2017-02-20 LAB — URINALYSIS, ROUTINE W REFLEX MICROSCOPIC
Bacteria, UA: NONE SEEN
Bilirubin Urine: NEGATIVE
Glucose, UA: NEGATIVE mg/dL
Ketones, ur: NEGATIVE mg/dL
Nitrite: NEGATIVE
Protein, ur: 30 mg/dL — AB
Specific Gravity, Urine: 1.02 (ref 1.005–1.030)
pH: 7 (ref 5.0–8.0)

## 2017-02-20 LAB — WET PREP, GENITAL
Clue Cells Wet Prep HPF POC: NONE SEEN
Sperm: NONE SEEN
Trich, Wet Prep: NONE SEEN
Yeast Wet Prep HPF POC: NONE SEEN

## 2017-02-20 LAB — RAPID URINE DRUG SCREEN, HOSP PERFORMED
Amphetamines: NOT DETECTED
Barbiturates: NOT DETECTED
Benzodiazepines: NOT DETECTED
Cocaine: NOT DETECTED
Opiates: NOT DETECTED
Tetrahydrocannabinol: POSITIVE — AB

## 2017-02-20 LAB — CBC
HCT: 32.3 % — ABNORMAL LOW (ref 36.0–46.0)
Hemoglobin: 11.2 g/dL — ABNORMAL LOW (ref 12.0–15.0)
MCH: 32.7 pg (ref 26.0–34.0)
MCHC: 34.7 g/dL (ref 30.0–36.0)
MCV: 94.4 fL (ref 78.0–100.0)
Platelets: 290 10*3/uL (ref 150–400)
RBC: 3.42 MIL/uL — ABNORMAL LOW (ref 3.87–5.11)
RDW: 13 % (ref 11.5–15.5)
WBC: 7.6 10*3/uL (ref 4.0–10.5)

## 2017-02-20 LAB — TYPE AND SCREEN
ABO/RH(D): A POS
Antibody Screen: NEGATIVE

## 2017-02-20 SURGERY — Surgical Case
Anesthesia: Spinal | Wound class: Clean Contaminated

## 2017-02-20 MED ORDER — BUPIVACAINE IN DEXTROSE 0.75-8.25 % IT SOLN
INTRATHECAL | Status: DC | PRN
  Administered 2017-02-20: 1.6 mL via INTRATHECAL

## 2017-02-20 MED ORDER — KETOROLAC TROMETHAMINE 30 MG/ML IJ SOLN
30.0000 mg | Freq: Four times a day (QID) | INTRAMUSCULAR | Status: DC | PRN
  Administered 2017-02-20: 30 mg via INTRAMUSCULAR

## 2017-02-20 MED ORDER — FENTANYL CITRATE (PF) 100 MCG/2ML IJ SOLN: 25.0000 ug | INTRAMUSCULAR | Status: DC | PRN

## 2017-02-20 MED ORDER — MORPHINE SULFATE (PF) 0.5 MG/ML IJ SOLN
INTRAMUSCULAR | Status: AC
  Filled 2017-02-20: qty 10

## 2017-02-20 MED ORDER — OXYTOCIN 10 UNIT/ML IJ SOLN
INTRAVENOUS | Status: DC | PRN
  Administered 2017-02-20: 40 [IU] via INTRAVENOUS

## 2017-02-20 MED ORDER — KETOROLAC TROMETHAMINE 30 MG/ML IJ SOLN
INTRAMUSCULAR | Status: AC
  Filled 2017-02-20: qty 1

## 2017-02-20 MED ORDER — ONDANSETRON HCL 4 MG/2ML IJ SOLN
INTRAMUSCULAR | Status: DC | PRN
  Administered 2017-02-20: 4 mg via INTRAVENOUS

## 2017-02-20 MED ORDER — SCOPOLAMINE 1 MG/3DAYS TD PT72: 1.0000 | MEDICATED_PATCH | Freq: Once | TRANSDERMAL | Status: DC

## 2017-02-20 MED ORDER — BETAMETHASONE SOD PHOS & ACET 6 (3-3) MG/ML IJ SUSP
12.0000 mg | Freq: Once | INTRAMUSCULAR | Status: AC
  Administered 2017-02-20: 12 mg via INTRAMUSCULAR
  Filled 2017-02-20: qty 2

## 2017-02-20 MED ORDER — SIMETHICONE 80 MG PO CHEW
80.0000 mg | CHEWABLE_TABLET | ORAL | Status: DC
  Administered 2017-02-21 – 2017-02-23 (×3): 80 mg via ORAL
  Filled 2017-02-20 (×3): qty 1

## 2017-02-20 MED ORDER — WITCH HAZEL-GLYCERIN EX PADS: 1.0000 "application " | MEDICATED_PAD | CUTANEOUS | Status: DC | PRN

## 2017-02-20 MED ORDER — DIPHENHYDRAMINE HCL 50 MG/ML IJ SOLN: 12.5000 mg | INTRAMUSCULAR | Status: DC | PRN

## 2017-02-20 MED ORDER — IBUPROFEN 600 MG PO TABS
600.0000 mg | ORAL_TABLET | Freq: Four times a day (QID) | ORAL | Status: DC
  Administered 2017-02-21 – 2017-02-23 (×10): 600 mg via ORAL
  Filled 2017-02-20 (×10): qty 1

## 2017-02-20 MED ORDER — SODIUM CHLORIDE 0.9% FLUSH: 3.0000 mL | INTRAVENOUS | Status: DC | PRN

## 2017-02-20 MED ORDER — SENNOSIDES-DOCUSATE SODIUM 8.6-50 MG PO TABS
2.0000 | ORAL_TABLET | ORAL | Status: DC
  Administered 2017-02-21 – 2017-02-23 (×3): 2 via ORAL
  Filled 2017-02-20 (×3): qty 2

## 2017-02-20 MED ORDER — DEXAMETHASONE SODIUM PHOSPHATE 10 MG/ML IJ SOLN
INTRAMUSCULAR | Status: AC
  Filled 2017-02-20: qty 1

## 2017-02-20 MED ORDER — NALBUPHINE HCL 10 MG/ML IJ SOLN: 5.0000 mg | INTRAMUSCULAR | Status: DC | PRN

## 2017-02-20 MED ORDER — FAMOTIDINE IN NACL 20-0.9 MG/50ML-% IV SOLN
20.0000 mg | Freq: Once | INTRAVENOUS | Status: AC
  Administered 2017-02-20: 20 mg via INTRAVENOUS
  Filled 2017-02-20: qty 50

## 2017-02-20 MED ORDER — OXYTOCIN 40 UNITS IN LACTATED RINGERS INFUSION - SIMPLE MED
2.5000 [IU]/h | INTRAVENOUS | Status: AC
  Administered 2017-02-20: 2.5 [IU]/h via INTRAVENOUS

## 2017-02-20 MED ORDER — LACTATED RINGERS IV SOLN
INTRAVENOUS | Status: DC
  Administered 2017-02-20 (×3): via INTRAVENOUS

## 2017-02-20 MED ORDER — NALBUPHINE HCL 10 MG/ML IJ SOLN: 5.0000 mg | Freq: Once | INTRAMUSCULAR | Status: DC | PRN

## 2017-02-20 MED ORDER — SCOPOLAMINE 1 MG/3DAYS TD PT72
MEDICATED_PATCH | TRANSDERMAL | Status: AC
  Administered 2017-02-20: 1.5 mg
  Filled 2017-02-20: qty 1

## 2017-02-20 MED ORDER — ONDANSETRON HCL 4 MG/2ML IJ SOLN: 4.0000 mg | Freq: Three times a day (TID) | INTRAMUSCULAR | Status: DC | PRN

## 2017-02-20 MED ORDER — COCONUT OIL OIL: 1.0000 "application " | TOPICAL_OIL | Status: DC | PRN

## 2017-02-20 MED ORDER — ACETAMINOPHEN 500 MG PO TABS: 1000.0000 mg | ORAL_TABLET | Freq: Four times a day (QID) | ORAL | Status: DC

## 2017-02-20 MED ORDER — NALOXONE HCL 0.4 MG/ML IJ SOLN: 0.4000 mg | INTRAMUSCULAR | Status: DC | PRN

## 2017-02-20 MED ORDER — FENTANYL CITRATE (PF) 100 MCG/2ML IJ SOLN
INTRAMUSCULAR | Status: DC | PRN
Start: 2017-02-20 — End: 2017-02-20
  Administered 2017-02-20: 100 ug via INTRAVENOUS
  Administered 2017-02-20: 10 ug via INTRAVENOUS

## 2017-02-20 MED ORDER — TETANUS-DIPHTH-ACELL PERTUSSIS 5-2.5-18.5 LF-MCG/0.5 IM SUSP
0.5000 mL | Freq: Once | INTRAMUSCULAR | Status: AC
  Administered 2017-02-21: 0.5 mL via INTRAMUSCULAR
  Filled 2017-02-20: qty 0.5

## 2017-02-20 MED ORDER — ONDANSETRON HCL 4 MG/2ML IJ SOLN
INTRAMUSCULAR | Status: AC
  Filled 2017-02-20: qty 2

## 2017-02-20 MED ORDER — BUPIVACAINE IN DEXTROSE 0.75-8.25 % IT SOLN
INTRATHECAL | Status: AC
  Filled 2017-02-20: qty 2

## 2017-02-20 MED ORDER — DEXAMETHASONE SODIUM PHOSPHATE 4 MG/ML IJ SOLN
INTRAMUSCULAR | Status: DC | PRN
  Administered 2017-02-20: 4 mg via INTRAVENOUS

## 2017-02-20 MED ORDER — FENTANYL CITRATE (PF) 100 MCG/2ML IJ SOLN
INTRAMUSCULAR | Status: AC
  Filled 2017-02-20: qty 2

## 2017-02-20 MED ORDER — SODIUM CHLORIDE 0.9 % IR SOLN
Status: DC | PRN
  Administered 2017-02-20: 1

## 2017-02-20 MED ORDER — LACTATED RINGERS IV SOLN
INTRAVENOUS | Status: DC | PRN
  Administered 2017-02-20: 12:00:00 via INTRAVENOUS

## 2017-02-20 MED ORDER — PROMETHAZINE HCL 25 MG/ML IJ SOLN: 6.2500 mg | INTRAMUSCULAR | Status: DC | PRN

## 2017-02-20 MED ORDER — KETOROLAC TROMETHAMINE 30 MG/ML IJ SOLN: 30.0000 mg | Freq: Four times a day (QID) | INTRAMUSCULAR | Status: DC | PRN

## 2017-02-20 MED ORDER — ZOLPIDEM TARTRATE 5 MG PO TABS: 5.0000 mg | ORAL_TABLET | Freq: Every evening | ORAL | Status: DC | PRN

## 2017-02-20 MED ORDER — DIPHENHYDRAMINE HCL 25 MG PO CAPS
25.0000 mg | ORAL_CAPSULE | ORAL | Status: DC | PRN
  Filled 2017-02-20: qty 1

## 2017-02-20 MED ORDER — MEPERIDINE HCL 25 MG/ML IJ SOLN: 6.2500 mg | INTRAMUSCULAR | Status: DC | PRN

## 2017-02-20 MED ORDER — DEXTROSE 5 % IV SOLN
1.0000 ug/kg/h | INTRAVENOUS | Status: DC | PRN
  Filled 2017-02-20: qty 2

## 2017-02-20 MED ORDER — DIPHENHYDRAMINE HCL 25 MG PO CAPS: 25.0000 mg | ORAL_CAPSULE | Freq: Four times a day (QID) | ORAL | Status: DC | PRN

## 2017-02-20 MED ORDER — CEFAZOLIN SODIUM-DEXTROSE 2-4 GM/100ML-% IV SOLN
2.0000 g | INTRAVENOUS | Status: AC
  Administered 2017-02-20: 2 g via INTRAVENOUS
  Filled 2017-02-20: qty 100

## 2017-02-20 MED ORDER — SIMETHICONE 80 MG PO CHEW
80.0000 mg | CHEWABLE_TABLET | Freq: Three times a day (TID) | ORAL | Status: DC
  Administered 2017-02-20 – 2017-02-23 (×7): 80 mg via ORAL
  Filled 2017-02-20 (×7): qty 1

## 2017-02-20 MED ORDER — SIMETHICONE 80 MG PO CHEW: 80.0000 mg | CHEWABLE_TABLET | ORAL | Status: DC | PRN

## 2017-02-20 MED ORDER — PRENATAL MULTIVITAMIN CH
1.0000 | ORAL_TABLET | Freq: Every day | ORAL | Status: DC
  Administered 2017-02-21 – 2017-02-22 (×2): 1 via ORAL
  Filled 2017-02-20 (×2): qty 1

## 2017-02-20 MED ORDER — OXYTOCIN 10 UNIT/ML IJ SOLN
INTRAMUSCULAR | Status: AC
  Filled 2017-02-20: qty 4

## 2017-02-20 MED ORDER — ACETAMINOPHEN 325 MG PO TABS: 650.0000 mg | ORAL_TABLET | ORAL | Status: DC | PRN

## 2017-02-20 MED ORDER — SOD CITRATE-CITRIC ACID 500-334 MG/5ML PO SOLN
30.0000 mL | Freq: Once | ORAL | Status: AC
  Administered 2017-02-20: 30 mL via ORAL
  Filled 2017-02-20: qty 15

## 2017-02-20 MED ORDER — MENTHOL 3 MG MT LOZG: 1.0000 | LOZENGE | OROMUCOSAL | Status: DC | PRN

## 2017-02-20 MED ORDER — MORPHINE SULFATE (PF) 0.5 MG/ML IJ SOLN
INTRAMUSCULAR | Status: DC | PRN
  Administered 2017-02-20: 200 ug via EPIDURAL

## 2017-02-20 MED ORDER — DIBUCAINE 1 % RE OINT: 1.0000 "application " | TOPICAL_OINTMENT | RECTAL | Status: DC | PRN

## 2017-02-20 MED ORDER — LACTATED RINGERS IV SOLN
INTRAVENOUS | Status: DC
  Administered 2017-02-20: 16:00:00 via INTRAVENOUS

## 2017-02-20 MED ORDER — PHENYLEPHRINE 8 MG IN D5W 100 ML (0.08MG/ML) PREMIX OPTIME
INJECTION | INTRAVENOUS | Status: DC | PRN
  Administered 2017-02-20: 60 ug/min via INTRAVENOUS
  Administered 2017-02-20: 70 ug/min via INTRAVENOUS

## 2017-02-20 SURGICAL SUPPLY — 38 items
APL SKNCLS STERI-STRIP NONHPOA (GAUZE/BANDAGES/DRESSINGS) ×1
BENZOIN TINCTURE PRP APPL 2/3 (GAUZE/BANDAGES/DRESSINGS) ×3 IMPLANT
CHLORAPREP W/TINT 26ML (MISCELLANEOUS) ×3 IMPLANT
CLAMP CORD UMBIL (MISCELLANEOUS) IMPLANT
CLOSURE WOUND 1/2 X4 (GAUZE/BANDAGES/DRESSINGS) ×1
CLOTH BEACON ORANGE TIMEOUT ST (SAFETY) ×3 IMPLANT
DRSG OPSITE POSTOP 4X10 (GAUZE/BANDAGES/DRESSINGS) ×3 IMPLANT
ELECT REM PT RETURN 9FT ADLT (ELECTROSURGICAL) ×3
ELECTRODE REM PT RTRN 9FT ADLT (ELECTROSURGICAL) ×1 IMPLANT
EXTRACTOR VACUUM M CUP 4 TUBE (SUCTIONS) IMPLANT
EXTRACTOR VACUUM M CUP 4' TUBE (SUCTIONS)
GLOVE BIOGEL PI IND STRL 7.0 (GLOVE) ×2 IMPLANT
GLOVE BIOGEL PI IND STRL 7.5 (GLOVE) ×2 IMPLANT
GLOVE BIOGEL PI INDICATOR 7.0 (GLOVE) ×4
GLOVE BIOGEL PI INDICATOR 7.5 (GLOVE) ×4
GLOVE ECLIPSE 7.5 STRL STRAW (GLOVE) ×3 IMPLANT
GOWN STRL REUS W/TWL LRG LVL3 (GOWN DISPOSABLE) ×9 IMPLANT
KIT ABG SYR 3ML LUER SLIP (SYRINGE) IMPLANT
NDL SAFETY ECLIPSE 18X1.5 (NEEDLE) ×1 IMPLANT
NEEDLE HYPO 18GX1.5 SHARP (NEEDLE) ×3
NEEDLE HYPO 25X5/8 SAFETYGLIDE (NEEDLE) IMPLANT
NS IRRIG 1000ML POUR BTL (IV SOLUTION) ×3 IMPLANT
PACK C SECTION WH (CUSTOM PROCEDURE TRAY) ×3 IMPLANT
PAD ABD 7.5X8 STRL (GAUZE/BANDAGES/DRESSINGS) ×3 IMPLANT
PAD OB MATERNITY 4.3X12.25 (PERSONAL CARE ITEMS) ×3 IMPLANT
PENCIL SMOKE EVAC W/HOLSTER (ELECTROSURGICAL) ×3 IMPLANT
RTRCTR C-SECT PINK 25CM LRG (MISCELLANEOUS) ×3 IMPLANT
SPONGE GAUZE 4X4 12PLY STER LF (GAUZE/BANDAGES/DRESSINGS) ×6 IMPLANT
STRIP CLOSURE SKIN 1/2X4 (GAUZE/BANDAGES/DRESSINGS) ×2 IMPLANT
SUT PLAIN 0 NONE (SUTURE) ×6 IMPLANT
SUT VIC AB 0 CTX 36 (SUTURE) ×8
SUT VIC AB 0 CTX36XBRD ANBCTRL (SUTURE) ×4 IMPLANT
SUT VIC AB 2-0 CT1 27 (SUTURE) ×3
SUT VIC AB 2-0 CT1 TAPERPNT 27 (SUTURE) ×1 IMPLANT
SUT VIC AB 4-0 KS 27 (SUTURE) ×3 IMPLANT
SUT VIC AB 4-0 PS2 27 (SUTURE) ×3 IMPLANT
TOWEL OR 17X24 6PK STRL BLUE (TOWEL DISPOSABLE) ×3 IMPLANT
TRAY FOLEY BAG SILVER LF 14FR (SET/KITS/TRAYS/PACK) ×3 IMPLANT

## 2017-02-20 NOTE — Anesthesia Procedure Notes (Signed)
Spinal  Patient location during procedure: OR Start time: 02/20/2017 11:10 AM End time: 02/20/2017 11:13 AM Staffing Anesthesiologist: Renold Don E Performed: anesthesiologist  Preanesthetic Checklist Completed: patient identified, surgical consent, pre-op evaluation, timeout performed, IV checked, risks and benefits discussed and monitors and equipment checked Spinal Block Patient position: sitting Prep: DuraPrep Patient monitoring: heart rate, cardiac monitor, continuous pulse ox and blood pressure Approach: midline Location: L3-4 Injection technique: single-shot Needle Needle type: Pencan  Needle gauge: 24 G Additional Notes Functioning IV was confirmed and monitors were applied. Sterile prep and drape, including hand hygiene, mask, and sterile gloves were used. The patient was positioned and the spine was prepped. The skin was anesthetized with lidocaine. Free flow of clear CSF was obtained prior to injecting local anesthetic into the CSF. The spinal needle aspirated freely following injection. The needle was carefully withdrawn. The patient tolerated the procedure well. Consent was obtained prior to the procedure with all questions answered and concerns addressed. Risks including, but not limited to, bleeding, infection, nerve damage, paralysis, failed block, inadequate analgesia, allergic reaction, high spinal, itching, and headache were discussed and the patient wished to proceed.  Renold Don, MD

## 2017-02-20 NOTE — H&P (Signed)
History     CSN: [id number]  Arrival date and time: 02/20/17 6295   First Provider Initiated Contact with Patient 02/20/17 [phone]      Chief Complaint  Patient presents with  . Vaginal Bleeding   HPI Jennifer Cortez is a 31 y.o. [id number] at [redacted]w[redacted]d who presents with vaginal bleeding. History of PTD x 2 at 36 weeks & HSV. Last HSV outbreak was "a few months ago" & is not currently taking treatment. Was to be taking vaginal progesterone d/t hx of PTD but stopped taking it for unknown reason.  Reports brown spotting yesterday that changed to bright red spotting on toilet paper today. Reports abdominal pain every 5-10 minutes since just prior to arrival. Denies dysuria, hematuria, n/v/d, recent intercourse, or vaginal irritation.   OB History    Gravida Para Term Preterm AB Living   10 5 3 2 4 5    SAB TAB Ectopic Multiple Live Births   1 3 0 0 5      Past Medical History:  Diagnosis Date  . Anxiety   . Depression   . Dermoid cyst of right ovary     5 cm dermoid in right ovary -> plan laparoscopic removal after delivery.  Kennyth Arnold stones   . HSV (herpes simplex virus) infection     Past Surgical History:  Procedure Laterality Date  . DILATION AND CURETTAGE OF UTERUS    . gall stones    . MULTIPLE TOOTH EXTRACTIONS      Family History  Problem Relation Age of Onset  . Diabetes Maternal Grandmother   . Hepatitis B Maternal Grandmother     Social History  Substance Use Topics  . Smoking status: Former Smoker    Packs/day: 0.25    Types: Cigarettes    Quit date: 11/25/2013  . Smokeless tobacco: Never Used  . Alcohol use No     Comment: social, not while pregnant    Allergies: No Known Allergies  Prescriptions Prior to Admission  Medication Sig Dispense Refill Last Dose  . acetaminophen (TYLENOL) 325 MG tablet Take 2 tablets (650 mg total) by mouth every 4 (four) hours as needed (for pain scale < 4). 60 tablet 1   . docusate sodium (COLACE) 100 MG capsule Take 1  capsule (100 mg total) by mouth 2 (two) times daily as needed. 30 capsule 2   . Prenat-FeAsp-Meth-FA-DHA w/o A (PRENATE PIXIE) 10-0.6-0.4-200 MG CAPS Take 1 tablet by mouth daily. 30 capsule 12 Taking  . progesterone (PROMETRIUM) 200 MG capsule Place one capsule vaginally at bedtime 30 capsule 3 Taking  . valACYclovir (VALTREX) 1000 MG tablet Take 1 tablet (1,000 mg total) by mouth 2 (two) times daily. 60 tablet 4     Review of Systems  Constitutional: Negative.   Gastrointestinal: Positive for abdominal pain. Negative for constipation, diarrhea, nausea and vomiting.  Genitourinary: Positive for vaginal bleeding and vaginal discharge. Negative for dysuria, hematuria and vaginal pain.   Physical Exam   Blood pressure 109/68, pulse 92, temperature 97.7 F (36.5 C), temperature source Oral, resp. rate 18, last menstrual period 06/30/2016, unknown if currently breastfeeding.  Physical Exam  Nursing note and vitals reviewed. Constitutional: She is oriented to person, place, and time. She appears well-developed and well-nourished. No distress.  HENT:  Head: Normocephalic and atraumatic.  Eyes: Conjunctivae are normal. Right eye exhibits no discharge. Left eye exhibits no discharge. No scleral icterus.  Neck: Normal range of motion.  Respiratory: Effort normal. No respiratory distress.  GI: Soft. There is no tenderness.  Ctx palpate mild with adequate resting tone  Genitourinary: There is lesion (small ulcerate lesion x 1) on the right labia. There is bleeding (small amount of dark red bloody show) in the vagina.  Genitourinary Comments: Dilation: 5.5 Effacement (%): 60 Cervical Position: Middle Station: Ballotable Presentation: Vertex Exam by:: Robyne Askew NP    Neurological: She is alert and oriented to person, place, and time.  Skin: Skin is warm and dry. She is not diaphoretic.  Psychiatric: She has a normal mood and affect. Her behavior is normal. Judgment and thought content  normal.   Fetal Tracing:  Baseline: 140 Variability: moderate Accelerations: 15x15 Decelerations: none  Toco: Q5-6 mins MAU Course  Procedures Results for orders placed or performed during the hospital encounter of 02/20/17 (from the past 24 hour(s))  Urinalysis, Routine w reflex microscopic     Status: Abnormal   Collection Time: 02/20/17  7:55 AM  Result Value Ref Range   Color, Urine YELLOW YELLOW   APPearance HAZY (A) CLEAR   Specific Gravity, Urine 1.020 1.005 - 1.030   pH 7.0 5.0 - 8.0   Glucose, UA NEGATIVE NEGATIVE mg/dL   Hgb urine dipstick MODERATE (A) NEGATIVE   Bilirubin Urine NEGATIVE NEGATIVE   Ketones, ur NEGATIVE NEGATIVE mg/dL   Protein, ur 30 (A) NEGATIVE mg/dL   Nitrite NEGATIVE NEGATIVE   Leukocytes, UA MODERATE (A) NEGATIVE   RBC / HPF 0-5 0 - 5 RBC/hpf   WBC, UA 6-30 0 - 5 WBC/hpf   Bacteria, UA NONE SEEN NONE SEEN   Squamous Epithelial / LPF 0-5 (A) NONE SEEN   Mucus PRESENT   Wet prep, genital     Status: Abnormal   Collection Time: 02/20/17  8:55 AM  Result Value Ref Range   Yeast Wet Prep HPF POC NONE SEEN NONE SEEN   Trich, Wet Prep NONE SEEN NONE SEEN   Clue Cells Wet Prep HPF POC NONE SEEN NONE SEEN   WBC, Wet Prep HPF POC MANY (A) NONE SEEN   Sperm NONE SEEN   CBC     Status: Abnormal   Collection Time: 02/20/17  9:36 AM  Result Value Ref Range   WBC 7.6 4.0 - 10.5 K/uL   RBC 3.42 (L) 3.87 - 5.11 MIL/uL   Hemoglobin 11.2 (L) 12.0 - 15.0 g/dL   HCT 32.3 (L) 36.0 - 46.0 %   MCV 94.4 78.0 - 100.0 fL   MCH 32.7 26.0 - 34.0 pg   MCHC 34.7 30.0 - 36.0 g/dL   RDW 13.0 11.5 - 15.5 %   Platelets 290 150 - 400 K/uL  Type and screen     Status: None (Preliminary result)   Collection Time: 02/20/17  9:36 AM  Result Value Ref Range   ABO/RH(D) A POS    Antibody Screen PENDING    Sample Expiration 02/23/2017     MDM Reactive NST. SVE 5.5/ballotable/vertex. ?HSV lesion. C/w Dr. Rip Harbour who will come assess for HSV outbreak.  GC/CT, wet prep,  & GBS pending. BMZ ordered.   Patient made cervical change while in MAU & reports worsening pain with contractions. Dr. Rip Harbour notified. Will come speak with patient about POC.  Assessment and Plan  A: Preterm labor in third trimester of pregnancy HSV affecting pregnancy  P: Primary c/section for HSV outbreak & preterm labor   The risks of cesarean section discussed with the patient included but were not limited to: bleeding which may require transfusion  or reoperation; infection which may require antibiotics; injury to bowel, bladder, ureters or other surrounding organs; injury to the fetus; need for additional procedures including hysterectomy in the event of a life-threatening hemorrhage; placental abnormalities wth subsequent pregnancies, incisional problems, thromboembolic phenomenon and other postoperative/anesthesia complications. The patient concurred with the proposed plan, giving informed written consent for the procedure.   Patient has been NPO since last night she will remain NPO for procedure. Anesthesia and OR aware.  Preoperative prophylactic Ancef ordered on call to the OR.  To OR when ready.  Truett Mainland, DO 02/20/2017 10:49 AM

## 2017-02-20 NOTE — Anesthesia Postprocedure Evaluation (Signed)
Anesthesia Post Note  Patient: Jennifer Cortez  Procedure(s) Performed: CESAREAN SECTION (N/A )     Patient location during evaluation: PACU Anesthesia Type: Spinal Level of consciousness: awake and alert Pain management: pain level controlled Vital Signs Assessment: post-procedure vital signs reviewed and stable Respiratory status: spontaneous breathing and respiratory function stable Cardiovascular status: blood pressure returned to baseline and stable Postop Assessment: spinal receding and no apparent nausea or vomiting Anesthetic complications: no    Last Vitals:  Vitals:   02/20/17 1400 02/20/17 1420  BP: 100/64 103/60  Pulse: (!) 55 (!) 55  Resp: 13 15  Temp:  36.8 C  SpO2: 100% 96%    Last Pain:  Vitals:   02/20/17 1400  TempSrc:   PainSc: 4    Pain Goal: Patients Stated Pain Goal: 4 (02/20/17 1315)               Audry Pili

## 2017-02-20 NOTE — MAU Note (Signed)
Pt states she has been prescribed valtrex & progesterone but is not currently taking either one.

## 2017-02-20 NOTE — Transfer of Care (Signed)
Immediate Anesthesia Transfer of Care Note  Patient: Jennifer Cortez  Procedure(s) Performed: CESAREAN SECTION (N/A )  Patient Location: PACU  Anesthesia Type:Regional  Level of Consciousness: awake, alert , oriented and patient cooperative  Airway & Oxygen Therapy: Patient Spontanous Breathing  Post-op Assessment: Report given to RN and Post -op Vital signs reviewed and stable  Post vital signs: Reviewed and stable  Last Vitals:  Vitals:   02/20/17 0811  BP: 109/68  Pulse: 92  Resp: 18  Temp: 36.5 C    Last Pain:  Vitals:   02/20/17 0811  TempSrc: Oral         Complications: No apparent anesthesia complications

## 2017-02-20 NOTE — H&P (Signed)
Jennifer Cortez is a 31 y.o. female [id number] IUP 33 4/7 weeks presents to MAU with c/o vaginal bleeding and ut ctx. Pt reports some brownish vaginal discharge yesterday. Awoke this morning with ut ctx every 5 minutes and vaginal bleeding. On arrival to MAU note to be contracting q 5-6 minutes, cervix 5 cm and ? HSv lesion on right labia. Pt denies any LOF, last IC was this past Sat.  H/O HSV with last outbreak a few months ago. She does not get prodromal sx prior to outbreaks and current lesion is in a location of previous lesions.  Prenatal course complicated by PTL with PTD, pt declined 17 OHP and vaginal progesterone. Last OB visit was 01/16/17.    OB History    Gravida Para Term Preterm AB Living   10 5 3 2 4 5    SAB TAB Ectopic Multiple Live Births   1 3 0 0 5     Past Medical History:  Diagnosis Date  . Anxiety   . Depression   . Dermoid cyst of right ovary     5 cm dermoid in right ovary -> plan laparoscopic removal after delivery.  Jennifer Cortez stones   . HSV (herpes simplex virus) infection    Past Surgical History:  Procedure Laterality Date  . DILATION AND CURETTAGE OF UTERUS    . gall stones    . MULTIPLE TOOTH EXTRACTIONS     Family History: family history includes Diabetes in her maternal grandmother; Hepatitis B in her maternal grandmother. Social History:  reports that she quit smoking about 3 years ago. Her smoking use included Cigarettes. She smoked 0.25 packs per day. She has never used smokeless tobacco. She reports that she does not drink alcohol or use drugs.     Maternal Diabetes: No Genetic Screening: Declined Maternal Ultrasounds/Referrals: Normal Fetal Ultrasounds or other Referrals:  None Maternal Substance Abuse:  No Significant Maternal Medications:  None Significant Maternal Lab Results:  None Other Comments:  See above  Review of Systems  Constitutional: Negative.   Respiratory: Negative.   Cardiovascular: Negative.   Gastrointestinal:  Negative.   Genitourinary:       Labial lesion   History Dilation: 6.5 Effacement (%): 60 Station: Ballotable Exam by:: Robyne Askew NP Blood pressure 109/68, pulse 92, temperature 97.7 F (36.5 C), temperature source Oral, resp. rate 18, last menstrual period 06/30/2016, unknown if currently breastfeeding. Exam Physical Exam  Constitutional: She appears well-developed and well-nourished.  Cardiovascular: Normal rate, regular rhythm and normal heart sounds.   Respiratory: Effort normal and breath sounds normal.  GI: Soft. Bowel sounds are normal.  Gravid, FH 34  Genitourinary:  Genitourinary Comments: 5 cm with change to 6 cm over an hour 70% uclerative lesion on right labia suspension of HSV lexion    Prenatal labs: ABO, Rh: --/--/A POS (10/23 6659) Antibody: NEG (10/23 0936) Rubella: 1.34 (08/14 1700) RPR: Non Reactive (09/18 1100)  HBsAg: Negative (08/14 1700)  HIV:    GBS:     Assessment/Plan: IUP 33 4/7 weeks Active labor Active HSV lesion  C section recommended d/t above indications. R/B/Post op care and infant prematurity reviewed with pt. Pt given BMZ x 1. Will check labs plus UDS and proceed to OR will ready. Nursing and anesthesia aware. Pt verbalized understanding of POC and agreed.    Chancy Milroy 02/20/2017, 10:40 AM

## 2017-02-20 NOTE — Anesthesia Preprocedure Evaluation (Addendum)
Anesthesia Evaluation  Patient identified by MRN, date of birth, ID band Patient awake    Reviewed: Allergy & Precautions, H&P , NPO status , Patient's Chart, lab work & pertinent test results  Airway Mallampati: I  TM Distance: >3 FB Neck ROM: full    Dental no notable dental hx. (+) Dental Advisory Given   Pulmonary former smoker,    Pulmonary exam normal breath sounds clear to auscultation       Cardiovascular negative cardio ROS Normal cardiovascular exam Rhythm:Regular Rate:Normal     Neuro/Psych Anxiety Depression negative neurological ROS     GI/Hepatic negative GI ROS, Neg liver ROS,   Endo/Other  negative endocrine ROS  Renal/GU negative Renal ROS     Musculoskeletal   Abdominal Normal abdominal exam  (+)   Peds  Hematology  (+) anemia ,   Anesthesia Other Findings HSV  Reproductive/Obstetrics (+) Pregnancy                            Anesthesia Physical  Anesthesia Plan  ASA: II  Anesthesia Plan: Spinal   Post-op Pain Management:    Induction:   PONV Risk Score and Plan: Treatment may vary due to age or medical condition  Airway Management Planned: Natural Airway and Nasal Cannula  Additional Equipment: None  Intra-op Plan:   Post-operative Plan:   Informed Consent: I have reviewed the patients History and Physical, chart, labs and discussed the procedure including the risks, benefits and alternatives for the proposed anesthesia with the patient or authorized representative who has indicated his/her understanding and acceptance.   Dental advisory given  Plan Discussed with: CRNA  Anesthesia Plan Comments:         Anesthesia Quick Evaluation

## 2017-02-20 NOTE — MAU Note (Signed)
Pt saw brown spotting yesterday, was bright red this morning.  Denies pain or LOF.  Denies recent intercourse.

## 2017-02-20 NOTE — MAU Provider Note (Signed)
History     CSN: [id number]  Arrival date and time: 02/20/17 5643   First Provider Initiated Contact with Patient 02/20/17 [phone]      Chief Complaint  Patient presents with  . Vaginal Bleeding   HPI Pernie A Mccandlish is a 31 y.o. [id number] at [redacted]w[redacted]d who presents with vaginal bleeding. History of PTD x 2 at 36 weeks & HSV. Last HSV outbreak was "a few months ago" & is not currently taking treatment. Was to be taking vaginal progesterone d/t hx of PTD but stopped taking it for unknown reason.  Reports brown spotting yesterday that changed to bright red spotting on toilet paper today. Reports abdominal pain every 5-10 minutes since just prior to arrival. Denies dysuria, hematuria, n/v/d, recent intercourse, or vaginal irritation.   OB History    Gravida Para Term Preterm AB Living   10 5 3 2 4 5    SAB TAB Ectopic Multiple Live Births   1 3 0 0 5      Past Medical History:  Diagnosis Date  . Anxiety   . Depression   . Dermoid cyst of right ovary     5 cm dermoid in right ovary -> plan laparoscopic removal after delivery.  Kennyth Arnold stones   . HSV (herpes simplex virus) infection     Past Surgical History:  Procedure Laterality Date  . DILATION AND CURETTAGE OF UTERUS    . gall stones    . MULTIPLE TOOTH EXTRACTIONS      Family History  Problem Relation Age of Onset  . Diabetes Maternal Grandmother   . Hepatitis B Maternal Grandmother     Social History  Substance Use Topics  . Smoking status: Former Smoker    Packs/day: 0.25    Types: Cigarettes    Quit date: 11/25/2013  . Smokeless tobacco: Never Used  . Alcohol use No     Comment: social, not while pregnant    Allergies: No Known Allergies  Prescriptions Prior to Admission  Medication Sig Dispense Refill Last Dose  . acetaminophen (TYLENOL) 325 MG tablet Take 2 tablets (650 mg total) by mouth every 4 (four) hours as needed (for pain scale < 4). 60 tablet 1   . docusate sodium (COLACE) 100 MG capsule Take 1  capsule (100 mg total) by mouth 2 (two) times daily as needed. 30 capsule 2   . Prenat-FeAsp-Meth-FA-DHA w/o A (PRENATE PIXIE) 10-0.6-0.4-200 MG CAPS Take 1 tablet by mouth daily. 30 capsule 12 Taking  . progesterone (PROMETRIUM) 200 MG capsule Place one capsule vaginally at bedtime 30 capsule 3 Taking  . valACYclovir (VALTREX) 1000 MG tablet Take 1 tablet (1,000 mg total) by mouth 2 (two) times daily. 60 tablet 4     Review of Systems  Constitutional: Negative.   Gastrointestinal: Positive for abdominal pain. Negative for constipation, diarrhea, nausea and vomiting.  Genitourinary: Positive for vaginal bleeding and vaginal discharge. Negative for dysuria, hematuria and vaginal pain.   Physical Exam   Blood pressure 109/68, pulse 92, temperature 97.7 F (36.5 C), temperature source Oral, resp. rate 18, last menstrual period 06/30/2016, unknown if currently breastfeeding.  Physical Exam  Nursing note and vitals reviewed. Constitutional: She is oriented to person, place, and time. She appears well-developed and well-nourished. No distress.  HENT:  Head: Normocephalic and atraumatic.  Eyes: Conjunctivae are normal. Right eye exhibits no discharge. Left eye exhibits no discharge. No scleral icterus.  Neck: Normal range of motion.  Respiratory: Effort normal. No respiratory distress.  GI: Soft. There is no tenderness.  Ctx palpate mild with adequate resting tone  Genitourinary: There is lesion (small ulcerate lesion x 1) on the right labia. There is bleeding (small amount of dark red bloody show) in the vagina.  Genitourinary Comments: Dilation: 5.5 Effacement (%): 60 Cervical Position: Middle Station: Ballotable Presentation: Vertex Exam by:: Robyne Askew NP    Neurological: She is alert and oriented to person, place, and time.  Skin: Skin is warm and dry. She is not diaphoretic.  Psychiatric: She has a normal mood and affect. Her behavior is normal. Judgment and thought content  normal.   Fetal Tracing:  Baseline: 140 Variability: moderate Accelerations: 15x15 Decelerations: none  Toco: Q5-6 mins MAU Course  Procedures Results for orders placed or performed during the hospital encounter of 02/20/17 (from the past 24 hour(s))  Urinalysis, Routine w reflex microscopic     Status: Abnormal   Collection Time: 02/20/17  7:55 AM  Result Value Ref Range   Color, Urine YELLOW YELLOW   APPearance HAZY (A) CLEAR   Specific Gravity, Urine 1.020 1.005 - 1.030   pH 7.0 5.0 - 8.0   Glucose, UA NEGATIVE NEGATIVE mg/dL   Hgb urine dipstick MODERATE (A) NEGATIVE   Bilirubin Urine NEGATIVE NEGATIVE   Ketones, ur NEGATIVE NEGATIVE mg/dL   Protein, ur 30 (A) NEGATIVE mg/dL   Nitrite NEGATIVE NEGATIVE   Leukocytes, UA MODERATE (A) NEGATIVE   RBC / HPF 0-5 0 - 5 RBC/hpf   WBC, UA 6-30 0 - 5 WBC/hpf   Bacteria, UA NONE SEEN NONE SEEN   Squamous Epithelial / LPF 0-5 (A) NONE SEEN   Mucus PRESENT   Wet prep, genital     Status: Abnormal   Collection Time: 02/20/17  8:55 AM  Result Value Ref Range   Yeast Wet Prep HPF POC NONE SEEN NONE SEEN   Trich, Wet Prep NONE SEEN NONE SEEN   Clue Cells Wet Prep HPF POC NONE SEEN NONE SEEN   WBC, Wet Prep HPF POC MANY (A) NONE SEEN   Sperm NONE SEEN   CBC     Status: Abnormal   Collection Time: 02/20/17  9:36 AM  Result Value Ref Range   WBC 7.6 4.0 - 10.5 K/uL   RBC 3.42 (L) 3.87 - 5.11 MIL/uL   Hemoglobin 11.2 (L) 12.0 - 15.0 g/dL   HCT 32.3 (L) 36.0 - 46.0 %   MCV 94.4 78.0 - 100.0 fL   MCH 32.7 26.0 - 34.0 pg   MCHC 34.7 30.0 - 36.0 g/dL   RDW 13.0 11.5 - 15.5 %   Platelets 290 150 - 400 K/uL  Type and screen     Status: None (Preliminary result)   Collection Time: 02/20/17  9:36 AM  Result Value Ref Range   ABO/RH(D) A POS    Antibody Screen PENDING    Sample Expiration 02/23/2017     MDM Reactive NST. SVE 5.5/ballotable/vertex. ?HSV lesion. C/w Dr. Rip Harbour who will come assess for HSV outbreak.  GC/CT, wet prep,  & GBS pending. BMZ ordered.   Patient made cervical change while in MAU & reports worsening pain with contractions. Dr. Rip Harbour notified. Will come speak with patient about POC.  Assessment and Plan  A: Preterm labor in third trimester of pregnancy HSV affecting pregnancy  P: Primary c/section for HSV outbreak & preterm labor Patient has been given BMZ in MAU Dr. Rip Harbour at bedside  Jorje Guild 02/20/2017, 8:40 AM

## 2017-02-20 NOTE — Op Note (Signed)
Jennifer Cortez PROCEDURE DATE: 02/20/2017  PREOPERATIVE DIAGNOSES: Intrauterine pregnancy at [redacted]w[redacted]d weeks gestation; active herpes lesion, preterm labor, desire for infertlity  POSTOPERATIVE DIAGNOSES: The same  PROCEDURE: Primary Low Transverse Cesarean Section  SURGEON:  Dr. Loma Boston  ASSISTANT:  Dr. Dannielle Huh  ANESTHESIOLOGY TEAM: Anesthesiologist: Audry Pili, MD CRNA: Georgeanne Nim, CRNA  INDICATIONS: Jennifer Cortez is a 31 y.o. [id number] at [redacted]w[redacted]d here for cesarean section secondary to the indications listed under preoperative diagnoses; please see preoperative note for further details.  The risks of cesarean section were discussed with the patient including but were not limited to: bleeding which may require transfusion or reoperation; infection which may require antibiotics; injury to bowel, bladder, ureters or other surrounding organs; injury to the fetus; need for additional procedures including hysterectomy in the event of a life-threatening hemorrhage; placental abnormalities wth subsequent pregnancies, incisional problems, thromboembolic phenomenon and other postoperative/anesthesia complications.   The patient concurred with the proposed plan, giving informed written consent for the procedure.    FINDINGS:  Viable female infant in cephalic presentation. Clear amniotic fluid.  Intact placenta, three vessel cord.  Normal uterus, fallopian tubes and ovaries bilaterally. There was right ovarian cyst measuring 4 x 5 cm that could not be drained.  ANESTHESIA: Spinal ESTIMATED BLOOD LOSS: 792 ml URINE OUTPUT:  200 ml SPECIMENS: Placenta sent L&D COMPLICATIONS: None immediate  PROCEDURE IN DETAIL:  The patient preoperatively received intravenous antibiotics and had sequential compression devices applied to her lower extremities.  She was then taken to the operating room where spinal anesthesia was administered and was found to be adequate. She was then placed in a  dorsal supine position with a leftward tilt, and prepped and draped in a sterile manner.  A foley catheter was placed into her bladder and attached to constant gravity.  After an adequate timeout was performed, a Pfannenstiel skin incision was made with scalpel and carried through to the underlying layer of fascia. The fascia was incised in the midline, and this incision was extended bilaterally using the Mayo scissors.  Kocher clamps were applied to the superior aspect of the fascial incision and the underlying rectus muscles were dissected off bluntly.  A similar process was carried out on the inferior aspect of the fascial incision. The rectus muscles were separated in the midline bluntly and the peritoneum was entered bluntly. Attention was turned to the lower uterine segment where a low transverse hysterotomy was made with a scalpel and extended bilaterally bluntly.  The infant was successfully delivered, the cord was clamped and cut after one minute, and the infant was handed over to the awaiting neonatology team. Uterine massage was then administered, and the placenta delivered intact with a three-vessel cord. The uterus was then cleared of clots and debris.  The hysterotomy was closed with 0 Vicryl in a running locked fashion, and an imbricating layer was also placed with 0 Vicryl.  Figure-of-eight 0 Vicryl serosal stitches were placed to help with hemostasis.   The right Fallopian tube was palpated and then traced to it's fimbriae and an avascular midsection of the tube approximately 3-4cm from the cornua was grasped with the Babcock clamps and brought into a knuckle. The tube was double ligated with one 3-0 plain gut suture and the intervening portion of tube was transected and removed; prior to removal, another free tie of 3-0 plain gut was tied below the first suture.  Attention was then turned to the right fallopian tube, after confirmation  of identification by tracing the tube out to the fimbriae.  The same procedure was then performed on the left Fallopian tube.  The pelvis was cleared of all clot and debris. Hemostasis was confirmed on all surfaces.  The peritoneum was closed with a 0 Vicryl running stitch. The fascia was then closed using 0 Vicryl. The subcutaneous layer was irrigated.  The skin was closed with a 4-0 Vicryl subcuticular stitch. The patient tolerated the procedure well. Sponge, lap, instrument and needle counts were correct x 3.  She was taken to the recovery room in stable condition.    Dannielle Huh, [place], [facility]

## 2017-02-20 NOTE — Progress Notes (Signed)
Pt. Complaint ~ 1.5 hrs ago of swelling w/o pain in right hand pointer and middle finger. I gave her an ice pack and elevated pt's hand. At this point pt. Asked me to call MD because it "doesnt look better" swelling which was mainly in knuckle area is better but I called MD - resident Tim.  Tim asked me to continue the RICE therapy. MD will evaluate later.

## 2017-02-21 ENCOUNTER — Encounter (HOSPITAL_COMMUNITY): Payer: Self-pay | Admitting: Family Medicine

## 2017-02-21 LAB — CBC
HCT: 30.2 % — ABNORMAL LOW (ref 36.0–46.0)
Hemoglobin: 10.3 g/dL — ABNORMAL LOW (ref 12.0–15.0)
MCH: 32.3 pg (ref 26.0–34.0)
MCHC: 34.1 g/dL (ref 30.0–36.0)
MCV: 94.7 fL (ref 78.0–100.0)
Platelets: 316 10*3/uL (ref 150–400)
RBC: 3.19 MIL/uL — ABNORMAL LOW (ref 3.87–5.11)
RDW: 12.6 % (ref 11.5–15.5)
WBC: 13.5 10*3/uL — ABNORMAL HIGH (ref 4.0–10.5)

## 2017-02-21 LAB — RPR: RPR Ser Ql: NONREACTIVE

## 2017-02-21 LAB — GC/CHLAMYDIA PROBE AMP (~~LOC~~) NOT AT ARMC
Chlamydia: NEGATIVE
Neisseria Gonorrhea: NEGATIVE

## 2017-02-21 LAB — CULTURE, OB URINE: Culture: NO GROWTH

## 2017-02-21 MED ORDER — OXYCODONE-ACETAMINOPHEN 7.5-325 MG PO TABS
2.0000 | ORAL_TABLET | Freq: Three times a day (TID) | ORAL | Status: DC | PRN
  Administered 2017-02-21 – 2017-02-22 (×3): 2 via ORAL
  Filled 2017-02-21 (×3): qty 2

## 2017-02-21 NOTE — Progress Notes (Signed)
Notified Tim of pt's pain increase. Will write for percocet.  Notified Tim that pt. Removed her pressure drsg. HC underneath is moderately saturated with old looking serosanguinous drainage. Will consult with night team regarding chg or to leave in place for now. Drsg is intact around all edges.

## 2017-02-21 NOTE — Progress Notes (Signed)
Subjective: Postpartum Day 1: Cesarean Delivery  Pt without complaints this morning. Voiding without problems. Pain controlled. Tolerating diet.   Objective: Vital signs in last 24 hours: Temp:  [97.4 F (36.3 C)-98.2 F (36.8 C)] 97.9 F (36.6 C) (10/24 0845) Pulse Rate:  [55-70] 70 (10/24 0845) Resp:  [7-22] 18 (10/24 0845) BP: (94-113)/(53-74) 112/74 (10/24 0845) SpO2:  [91 %-100 %] 98 % (10/24 0410) Weight:  [93.9 kg (207 lb)] 93.9 kg (207 lb) (10/23 1050)  Physical Exam:  General: alert Lochia: appropriate Uterine Fundus: firm Incision: healing well DVT Evaluation: No evidence of DVT seen on physical exam.   Recent Labs  02/20/17 0936 02/21/17 0522  HGB 11.2* 10.3*  HCT 32.3* 30.2*    Assessment/Plan: Status post Cesarean section. Doing well postoperatively.  Continue current care.  Jennifer Cortez 02/21/2017, 10:01 AM

## 2017-02-21 NOTE — Progress Notes (Signed)
Pt's swelling of right hand has gone seemed to go down some per pt report. Continuing RICE therapy and will continue to monitor.

## 2017-02-22 ENCOUNTER — Encounter (HOSPITAL_COMMUNITY): Payer: Self-pay

## 2017-02-22 LAB — CULTURE, BETA STREP (GROUP B ONLY)

## 2017-02-22 NOTE — Progress Notes (Signed)
Subjective: Postpartum Day 2: Cesarean Delivery and BTL  Pt without complaints this morning. Voiding without problems. Pain controlled. Tolerating diet. Had flatus.   Objective: Vital signs in last 24 hours: Temp:  [97.5 F (36.4 C)-98.5 F (36.9 C)] 97.5 F (36.4 C) (10/25 0737) Pulse Rate:  [64-77] 70 (10/25 0737) Resp:  [18] 18 (10/25 0737) BP: (91-112)/(48-73) 112/73 (10/25 0737) SpO2:  [99 %-100 %] 100 % (10/25 0737)  Physical Exam:  General: alert Lochia: appropriate Uterine Fundus: firm Incision: healing well DVT Evaluation: No evidence of DVT seen on physical exam.   Recent Labs  02/20/17 0936 02/21/17 0522  HGB 11.2* 10.3*  HCT 32.3* 30.2*    Assessment/Plan: Status post Cesarean section. Doing well postoperatively.  Continue current care. Breast and bottle feeding Desires discharge to home tomorrow  Verita Schneiders, MD 02/22/2017, 10:56 AM

## 2017-02-23 MED ORDER — IBUPROFEN 600 MG PO TABS: 600.0000 mg | ORAL_TABLET | Freq: Four times a day (QID) | ORAL | 0 refills | Status: DC

## 2017-02-23 MED ORDER — OXYCODONE-ACETAMINOPHEN 5-325 MG PO TABS: 1.0000 | ORAL_TABLET | Freq: Four times a day (QID) | ORAL | 0 refills | Status: DC | PRN

## 2017-02-23 NOTE — Discharge Instructions (Signed)

## 2017-02-23 NOTE — Clinical Social Work Maternal (Signed)
CLINICAL SOCIAL WORK MATERNAL/CHILD NOTE  Patient Details  Name: Jennifer Cortez MRN: [id number] Date of Birth: 1986/03/09  Date:  02/23/2017  Clinical Social Worker Initiating Note:  Laurey Arrow Date/Time: Initiated:  02/23/17/1046     Child's Name:  Jennifer Cortez   Biological Parents:  Mother, Father   Need for Interpreter:  None   Reason for Referral:  Behavioral Health Concerns, Current Substance Use/Substance Use During Pregnancy    Address:  [address] [place] [address]    Phone number:  [phone] (home)     Additional phone number: [name] [phone]  Household Members/Support Persons (HM/SP):   Household Member/Support Person 1, Household Member/Support Person 2, Household Member/Support Person 3, Household Member/Support Person 4, Household Member/Support Person 5, Household Member/Support Person 6   HM/SP Name Relationship DOB or Age  HM/SP -1 Jennifer Cortez (FOB is currently incarcerated. ) FOB 05/07/1988  HM/SP -2 Jennifer Cortez daughter 09/08/2001  HM/SP -3 Jennifer Cortez daughter 04/26/2003  HM/SP -4 Jennifer Cortez son 08/05/2007  HM/SP -5 Jennifer Cortez  daughter 05/01/2014  HM/SP -6 Jennifer Cortez son 11/08/15  HM/SP -7        HM/SP -8          Natural Supports (not living in the home):  Friends, Immediate Family (FOB's family will also provide support.  )   Professional Supports: Transport planner (MOB has therapist at [employer].)   Employment: Part-time   Type of Work: Scientist, research (life sciences)   Education:  [place] arranged:    Museum/gallery curator Resources:  [employer]   Other Resources:  [employer], Physicist, medical    Cultural/Religious Considerations Which May Impact Care:  None Reported  Strengths:  Pediatrician chosen   Psychotropic Medications:         Pediatrician:    [place] area  Pediatrician List:   [place] for [place]      Pediatrician Fax Number:    Risk Factors/Current Problems:  Substance Use , Mental Health Concerns    Cognitive State:  Able to Concentrate , Alert , Linear Thinking , Insightful    Mood/Affect:  Comfortable , Interested , Tearful , Relaxed    CSW Assessment: CSW met with MOB to complete an assessment for NICU admission, MH hx, and SA hx.  When CSW arrived MOB was resting in bed with the lights and TV off.  CSW explained CSW's role and MOB was welcoming.  MOB was polite, forthcoming, and receptive to meeting with CSW.  CSW explored MOB thoughts and feeling relating to infant's NICU admission. MOB became tearful and expressed that MOB has never had a baby in the NICU.  MOB also communicated feelings of happiness about paying attention to MOB's body and making it to the hospital in time for a safe delivery. CSW validated and normalized MOB's thoughts and feelings. CSW shared NICU visitation policy and assessed for barriers with MOB visiting infant after MOB discharges. MOB communicated that MOB may need assistance with transportation, however, will reach out to CSW if the need arises.   MOB acknowledged a hx of anxiety and depression and shared with CSW that MOB was recently dx with bipolar disorder (July 2018).  MOB communicated that MOB is an established patient at [facility] and plans to start a medication regiment at [facility] next scheduled appointment (December  2018). MOB appeared to have insight and awareness about MOB's MH and did not present with any acute signs or symptoms.  CSW assessed for safety and MOB denied SI, HI, and DV. CSW provided education regarding the baby blues period vs. perinatal mood disorders, discussed treatment and gave resources for mental health follow up if concerns arise.  CSW recommends self-evaluation during the postpartum time period using the New Mom Checklist from Postpartum Progress and encouraged MOB to contact a  medical professional if symptoms are noted at any time.    CSW asked about MOB's SA hx and MOB disclosed MOB smoked marijuana throughout pregnancy. CSW informed MOB of hospital's SA policy and MOB was understanding.  MOB explained to MOB if infant's CDS is positive without and explanation, CSW will make a report to [facility] CPS. MOB denied having any questions about hospital's policy at this time, however, acknowledged a hx of CPS involvement.  MOB communicated MOB's last CPS case closed Sept. 2018, and MOB's CPS worker was Jennifer Cortez (Craig left a voicemail message with Jennifer Cortez, to verify that MOB's CPS case is closed).    MOB reports not having all necessary items for infant due to MOB not anticipating to deliver early.  However, MOB plans to get all that she can prior to infant's discharge.  CSW encouraged MOB to ask CSW for assistance if needed.   CSW will continue to assess MOB for psychosocial stressors and provide support while infant remains in NICU.    CSW Plan/Description:  Perinatal Mood and Anxiety Disorder (PMADs) Education, Other Patient/Family Education, Psychosocial Support and Ongoing Assessment of Needs, Hospital Drug Screen Policy Information, CSW Will Continue to Monitor Umbilical Cord Tissue Drug Screen Results and Make Report if Warranted, Other Information/Referral to [employer], Sudden Infant Death Syndrome (SIDS) Education   Laurey Arrow, MSW, LCSW Clinical Social Work [phone]  Dimple Nanas, LCSW 02/23/2017, 4:00 PM

## 2017-02-23 NOTE — Discharge Summary (Signed)
OB Discharge Summary     Patient Name: Jennifer Cortez DOB: 20-Mar-1986 MRN: [id number]  Date of admission: 02/20/2017 Delivering MD: Truett Mainland   Date of discharge: 02/23/2017  Admitting diagnosis: 33wks, spotting Intrauterine pregnancy: [redacted]w[redacted]d     Secondary diagnosis:  Active Problems:   S/P C-section   Preterm labor  Additional problems: history of preterm birth. Herpes outbreak     Discharge diagnosis: Preterm Pregnancy Delivered                                                                                                Post partum procedures:none  Augmentation: none  Complications: None  Hospital course:  Onset of Labor With Unplanned C/S  31 y.o. yo [id number] at [redacted]w[redacted]d was admitted in [facility] on 02/20/2017. Patient had a labor course significant for evidence of active genital herpes outbreak. Membrane Rupture Time/Date: 11:40 AM ,02/20/2017   The patient went for cesarean section due to Active HSV, and delivered a Viable infant,02/20/2017  Details of operation can be found in separate operative note. Patient had an uncomplicated postpartum course.  She is ambulating,tolerating a regular diet, passing flatus, and urinating well.  Patient is discharged home in stable condition 02/23/17.  Physical exam  Vitals:   02/22/17 0737 02/22/17 1627 02/22/17 1932 02/22/17 1950  BP: 112/73 118/69 117/74   Pulse: 70 79 (!) 43 66  Resp: 18 18 18    Temp: (!) 97.5 F (36.4 C) 98.2 F (36.8 C) 98.1 F (36.7 C)   TempSrc: Oral Oral Oral   SpO2: 100% 100% 99%   Weight:      Height:       General: alert, cooperative and no distress Lochia: appropriate Uterine Fundus: firm Incision: Healing well with no significant drainage DVT Evaluation: No evidence of DVT seen on physical exam. Labs: Lab Results  Component Value Date   WBC 13.5 (H) 02/21/2017   HGB 10.3 (L) 02/21/2017   HCT 30.2 (L) 02/21/2017   MCV 94.7 02/21/2017   PLT 316 02/21/2017   CMP Latest Ref Rng  & Units 02/05/2013  Glucose 70 - 99 mg/dL 95  BUN 6 - 23 mg/dL 13  Creatinine 0.50 - 1.10 mg/dL 0.76  Sodium 135 - 145 mEq/L 137  Potassium 3.5 - 5.1 mEq/L 4.0  Chloride 96 - 112 mEq/L 103  CO2 19 - 32 mEq/L 24  Calcium 8.4 - 10.5 mg/dL 9.3  Total Protein 6.0 - 8.3 g/dL 7.4  Total Bilirubin 0.3 - 1.2 mg/dL 0.2(L)  Alkaline Phos 39 - 117 U/L 83  AST 0 - 37 U/L 19  ALT 0 - 35 U/L 13    Discharge instruction: per After Visit Summary and "Baby and Me Booklet".  After visit meds:  Allergies as of 02/23/2017   No Known Allergies     Medication List    STOP taking these medications   progesterone 200 MG capsule Commonly known as:  PROMETRIUM   valACYclovir 1000 MG tablet Commonly known as:  VALTREX     TAKE these medications   ibuprofen 600 MG tablet Commonly known  as:  ADVIL,MOTRIN Take 1 tablet (600 mg total) by mouth every 6 (six) hours.   oxyCODONE-acetaminophen 5-325 MG tablet Commonly known as:  PERCOCET/ROXICET Take 1-2 tablets by mouth every 6 (six) hours as needed.   PRENATE PIXIE 10-0.6-0.4-200 MG Caps Take 1 tablet by mouth daily.       Diet: routine diet  Activity: Advance as tolerated. Pelvic rest for 6 weeks.   Outpatient follow up:1 month Follow up Appt:Future Appointments Date Time Provider New Home  03/20/2017 2:00 PM Chancy Milroy, MD [facility] None   Follow up Visit:f/u at [facility] 1 month Postpartum contraception: BTL  Newborn Data: Live born female  Birth Weight: 4 lb 9 oz (2070 g) APGAR: 8, 3  Newborn Delivery   Birth date/time:  02/20/2017 11:40:00 Delivery type:  C-Section, Low Transverse  C-section categorization:  Primary     Baby Feeding: Breast Disposition:NICU   02/23/2017 Emeterio Reeve, MD

## 2017-02-23 NOTE — Progress Notes (Signed)
Patient discharged home with friend. Discharge teaching, paperwork, home care, prescriptions, and follow-up appts reviewed. No questions at this time.

## 2017-02-26 ENCOUNTER — Encounter: Payer: Self-pay | Admitting: Obstetrics and Gynecology

## 2017-02-28 ENCOUNTER — Encounter (HOSPITAL_COMMUNITY): Payer: Self-pay | Admitting: Family Medicine

## 2017-02-28 NOTE — Addendum Note (Signed)
Addendum  created 02/28/17 0918 by Audry Pili, MD   Anesthesia Event edited, Anesthesia Staff edited

## 2017-03-08 ENCOUNTER — Ambulatory Visit (HOSPITAL_COMMUNITY): Payer: Medicaid Other

## 2017-03-20 ENCOUNTER — Ambulatory Visit (INDEPENDENT_AMBULATORY_CARE_PROVIDER_SITE_OTHER): Payer: Medicaid Other | Admitting: Obstetrics and Gynecology

## 2017-03-20 ENCOUNTER — Encounter: Payer: Self-pay | Admitting: *Deleted

## 2017-03-20 ENCOUNTER — Encounter: Payer: Self-pay | Admitting: Obstetrics and Gynecology

## 2017-03-20 VITALS — BP 104/71 | HR 71 | Ht 65.0 in | Wt 192.7 lb

## 2017-03-20 DIAGNOSIS — Z98891 History of uterine scar from previous surgery: Secondary | ICD-10-CM

## 2017-03-20 DIAGNOSIS — Z1389 Encounter for screening for other disorder: Secondary | ICD-10-CM | POA: Diagnosis not present

## 2017-03-20 NOTE — Progress Notes (Signed)
..  Post Partum Exam  Jennifer Cortez is a 31 y.o. [id number] female who presents for a postpartum visit. She is 4 weeks postpartum following a low cervical transverse Cesarean section. I have fully reviewed the prenatal and intrapartum course. The delivery was at 33.4 gestational weeks.  Anesthesia: spinal. Postpartum course has been good. Baby's course has been good. Baby is feeding by bottle - Similac Neosure. Bleeding staining only. Bowel function is normal. Bladder function is normal. Patient is not sexually active. Contraception method is tubal ligation. Postpartum depression screening:neg  The following portions of the patient's history were reviewed and updated as appropriate: allergies, current medications, past family history, past medical history, past social history and past surgical history.  Review of Systems Pertinent items are noted in HPI.    Objective:  Height 5\' 5"  (1.651 m), weight 192 lb 11.2 oz (87.4 kg), unknown if currently breastfeeding.  General:  alert   Breasts:  not examined  Lungs: clear to auscultation bilaterally  Heart:  regular rate and rhythm, S1, S2 normal, no murmur, click, rub or gallop  Abdomen: soft, non-tender; bowel sounds normal; no masses,  no organomegaly   Vulva:  not evaluated  Vagina: not evaluated  Cervix:  not evaluated  Corpus: not examined  Adnexa:  not evaluated  Rectal Exam: Not performed.        Assessment:    Nl postpartum exam. Pap smear 8/18, nl  Plan:   1. Contraception: tubal ligation 2. Return to nl ADL's. Return work note provided 3. Follow up in: 1 yr or as needed.

## 2017-03-20 NOTE — Patient Instructions (Signed)

## 2017-06-16 ENCOUNTER — Encounter (HOSPITAL_COMMUNITY): Payer: Self-pay | Admitting: Family Medicine

## 2017-06-16 ENCOUNTER — Telehealth (HOSPITAL_COMMUNITY): Payer: Self-pay | Admitting: Emergency Medicine

## 2017-06-16 ENCOUNTER — Ambulatory Visit (HOSPITAL_COMMUNITY)
Admission: EM | Admit: 2017-06-16 | Discharge: 2017-06-16 | Disposition: A | Discharge status: Home / Self Care | Payer: Medicaid Other | Attending: Family Medicine | Admitting: Family Medicine

## 2017-06-16 DIAGNOSIS — K047 Periapical abscess without sinus: Secondary | ICD-10-CM | POA: Diagnosis not present

## 2017-06-16 MED ORDER — IBUPROFEN 800 MG PO TABS: 800.0000 mg | ORAL_TABLET | Freq: Three times a day (TID) | ORAL | 0 refills | Status: DC

## 2017-06-16 MED ORDER — AMOXICILLIN 875 MG PO TABS: 875.0000 mg | ORAL_TABLET | Freq: Two times a day (BID) | ORAL | 0 refills | Status: DC

## 2017-06-16 NOTE — Telephone Encounter (Signed)
Pt reports Rx were sent to [facility] pharmacy but they are closed   Requested for meds to be sent to [facility] on [address]  Rx resent.

## 2017-06-16 NOTE — ED Provider Notes (Signed)
[facility]   [id number] 06/16/17 Arrival Time: 1232  ASSESSMENT & PLAN:  1. Dental abscess     Meds ordered this encounter  Medications  . DISCONTD: amoxicillin (AMOXIL) 875 MG tablet    Sig: Take 1 tablet (875 mg total) by mouth 2 (two) times daily.    Dispense:  20 tablet    Refill:  0    Order Specific Question:   Supervising Provider    Answer:   Vanessa Kick [id number]  . DISCONTD: ibuprofen (ADVIL,MOTRIN) 800 MG tablet    Sig: Take 1 tablet (800 mg total) by mouth 3 (three) times daily.    Dispense:  21 tablet    Refill:  0    Order Specific Question:   Supervising Provider    Answer:   Vanessa Kick [id number]  . DISCONTD: amoxicillin (AMOXIL) 875 MG tablet    Sig: Take 1 tablet (875 mg total) by mouth 2 (two) times daily.    Dispense:  20 tablet    Refill:  0    Order Specific Question:   Supervising Provider    Answer:   Vanessa Kick [id number]  . DISCONTD: ibuprofen (ADVIL,MOTRIN) 800 MG tablet    Sig: Take 1 tablet (800 mg total) by mouth 3 (three) times daily.    Dispense:  21 tablet    Refill:  0    Order Specific Question:   Supervising Provider    Answer:   Vanessa Kick [[id number]]    Reviewed expectations re: course of current medical issues. Questions answered. Outlined signs and symptoms indicating need for more acute intervention. Patient verbalized understanding. After Visit Summary given.   SUBJECTIVE: History from: patient. Jennifer Cortez is a 32 y.o. female who presents with complaint of persistent right lower jaw pain and swelling. Reports abrupt onset today. Described symptoms have gradually worsened since beginning.  ROS: As per HPI.   OBJECTIVE:  Vitals:   06/16/17 1359  BP: 105/70  Pulse: 72  Resp: 18  Temp: 98.2 F (36.8 C)  SpO2: 100%    General appearance: alert; no distress Eyes: PERRLA; EOMI; conjunctiva normal HENT: normocephalic; atraumatic; TMs normal; nasal mucosa normal; right lower dentition poor Neck:  supple  Lungs: clear to auscultation bilaterally Heart: regular rate and rhythm Abdomen: soft, non-tender; bowel sounds normal; no masses or organomegaly; no guarding or rebound tenderness Back: no CVA tenderness Extremities: no cyanosis or edema; symmetrical with no gross deformities Skin: warm and dry Neurologic: normal gait; normal symmetric reflexes Psychological: alert and cooperative; normal mood and affect  Labs:  Labs Reviewed - No data to display  Imaging: No results found.  No Known Allergies  Past Medical History:  Diagnosis Date  . Anxiety   . Bipolar 1 disorder ([place])   . Depression   . Dermoid cyst of right ovary     5 cm dermoid in right ovary -> plan laparoscopic removal after delivery.  K[place]th Arnold stones   . HSV (herpes simplex virus) infection    Social History   Socioeconomic History  . Marital status: Single    Spouse name: Not on file  . Number of children: Not on file  . Years of education: Not on file  . Highest education level: Not on file  Social Needs  . Financial resource strain: Not on file  . Food insecurity - worry: Not on file  . Food insecurity - inability: Not on file  . Transportation needs - medical: Not on file  . Transportation  needs - non-medical: Not on file  Occupational History  . Not on file  Tobacco Use  . Smoking status: Former Smoker    Packs/day: 0.25    Types: Cigarettes    Last attempt to quit: 11/25/2013    Years since quitting: 3.5  . Smokeless tobacco: Never Used  Substance and Sexual Activity  . Alcohol use: No    Comment: social, not while pregnant  . Drug use: No    Comment: quit mj  . Sexual activity: Not Currently    Birth control/protection: None  Other Topics Concern  . Not on file  Social History Narrative  . Not on file   Family History  Problem Relation Age of Onset  . Diabetes Maternal Grandmother   . Hepatitis B Maternal Grandmother    Past Surgical History:  Procedure Laterality Date  .  CESAREAN SECTION N/A 02/20/2017   Procedure: CESAREAN SECTION;  Surgeon: Truett Mainland, DO;  Location: [facility];  Service: Obstetrics;  Laterality: N/A;  . DILATION AND CURETTAGE OF UTERUS    . gall stones    . MULTIPLE TOOTH EXTRACTIONS       Lysbeth Penner, [place] 06/16/17 2057

## 2017-06-16 NOTE — ED Triage Notes (Signed)
Pt triaged by provider  

## 2017-07-09 ENCOUNTER — Encounter: Payer: Self-pay | Admitting: *Deleted

## 2018-08-28 ENCOUNTER — Emergency Department: Payer: Medicaid Other

## 2018-08-28 ENCOUNTER — Other Ambulatory Visit: Payer: Self-pay

## 2018-08-28 ENCOUNTER — Emergency Department
Admission: EM | Admit: 2018-08-28 | Discharge: 2018-08-28 | Disposition: A | Discharge status: Home / Self Care | Payer: Medicaid Other | Attending: Emergency Medicine | Admitting: Emergency Medicine

## 2018-08-28 ENCOUNTER — Encounter: Payer: Self-pay | Admitting: *Deleted

## 2018-08-28 DIAGNOSIS — Z87891 Personal history of nicotine dependence: Secondary | ICD-10-CM | POA: Diagnosis not present

## 2018-08-28 DIAGNOSIS — S0083XA Contusion of other part of head, initial encounter: Secondary | ICD-10-CM

## 2018-08-28 DIAGNOSIS — F319 Bipolar disorder, unspecified: Secondary | ICD-10-CM | POA: Insufficient documentation

## 2018-08-28 DIAGNOSIS — S60452A Superficial foreign body of right middle finger, initial encounter: Secondary | ICD-10-CM | POA: Diagnosis not present

## 2018-08-28 DIAGNOSIS — F419 Anxiety disorder, unspecified: Secondary | ICD-10-CM | POA: Diagnosis not present

## 2018-08-28 DIAGNOSIS — Y998 Other external cause status: Secondary | ICD-10-CM | POA: Diagnosis not present

## 2018-08-28 DIAGNOSIS — S6991XA Unspecified injury of right wrist, hand and finger(s), initial encounter: Secondary | ICD-10-CM | POA: Insufficient documentation

## 2018-08-28 DIAGNOSIS — Y9389 Activity, other specified: Secondary | ICD-10-CM | POA: Insufficient documentation

## 2018-08-28 DIAGNOSIS — W500XXA Accidental hit or strike by another person, initial encounter: Secondary | ICD-10-CM | POA: Insufficient documentation

## 2018-08-28 DIAGNOSIS — Y929 Unspecified place or not applicable: Secondary | ICD-10-CM | POA: Insufficient documentation

## 2018-08-28 DIAGNOSIS — S022XXA Fracture of nasal bones, initial encounter for closed fracture: Secondary | ICD-10-CM | POA: Insufficient documentation

## 2018-08-28 DIAGNOSIS — S0993XA Unspecified injury of face, initial encounter: Secondary | ICD-10-CM | POA: Diagnosis present

## 2018-08-28 MED ORDER — HYDROCODONE-ACETAMINOPHEN 5-325 MG PO TABS: 1.0000 | ORAL_TABLET | ORAL | 0 refills | Status: DC | PRN
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
## 2018-08-28 NOTE — ED Notes (Signed)
Rt middle finger is swollen and unable to get ring off.
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
## 2018-09-20 LAB — HM HIV SCREENING LAB: HM HIV Screening: NEGATIVE

## 2018-09-20 LAB — HM HEPATITIS C SCREENING LAB: HM Hepatitis Screen: NEGATIVE

## 2018-11-25 ENCOUNTER — Encounter: Payer: Self-pay | Admitting: Family Medicine

## 2019-01-10 ENCOUNTER — Ambulatory Visit: Payer: Medicaid Other | Admitting: Physician Assistant

## 2019-01-10 ENCOUNTER — Other Ambulatory Visit: Payer: Self-pay

## 2019-01-10 DIAGNOSIS — Z113 Encounter for screening for infections with a predominantly sexual mode of transmission: Secondary | ICD-10-CM | POA: Diagnosis not present

## 2019-01-10 LAB — WET PREP FOR TRICH, YEAST, CLUE
Clue Cell Exam: POSITIVE — AB
Trichomonas Exam: NEGATIVE
Yeast Exam: NEGATIVE

## 2019-01-12 ENCOUNTER — Encounter: Payer: Self-pay | Admitting: Physician Assistant

## 2019-01-12 NOTE — Progress Notes (Signed)
    STI clinic/screening visit  Subjective:  Jennifer Cortez is a 33 y.o. female being seen today for an STI screening visit. The patient reports they do have symptoms.  Patient has the following medical conditions:   Patient Active Problem List   Diagnosis Date Noted  . Postpartum care and examination 03/20/2017  . S/P C-section 02/20/2017  . 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 Moderate episode of recurrent major depressive disorder ([place]) 12/12/2016     Chief Complaint  Patient presents with  . SEXUALLY TRANSMITTED DISEASE    HPI  Patient reports that she has had a vaginal discharge with slight odor off and on.  Denies currently having symptoms.  Declines blood work today.  LMP 12/27/2018 and normal.  S/p BTL.  See flowsheet for further details and programmatic requirements.    The following portions of the patient's history were reviewed and updated as appropriate: allergies, current medications, past medical history, past social history, past surgical history and problem list.  Objective:  There were no vitals filed for this visit.  Physical Exam Constitutional:      General: She is not in acute distress.    Appearance: Normal appearance.  HENT:     Head: Normocephalic and atraumatic.     Mouth/Throat:     Mouth: Mucous membranes are moist.     Pharynx: Oropharynx is clear. No oropharyngeal exudate or posterior oropharyngeal erythema.  Eyes:     Conjunctiva/sclera: Conjunctivae normal.  Neck:     Musculoskeletal: Neck supple.  Pulmonary:     Effort: Pulmonary effort is normal.  Abdominal:     Palpations: Abdomen is soft. There is no mass.     Tenderness: There is no abdominal tenderness. There is no guarding or rebound.  Genitourinary:    General: Normal vulva.     Rectum: Normal.     Comments: External genitalia/pubic area without nits, lice, edema, erythema, lesions, and inguinal adenopathy. Vagina with normal mucosa and discharge. Cervix without visible  lesions. Uterus normal size, firm, mobile, nt, no masses, no CMT, no adnexal tenderness or fullness. Lymphadenopathy:     Cervical: No cervical adenopathy.  Skin:    General: Skin is warm and dry.     Findings: No bruising, erythema, lesion or rash.  Neurological:     Mental Status: She is alert and oriented to person, place, and time.  Psychiatric:        Mood and Affect: Mood normal.        Behavior: Behavior normal.        Thought Content: Thought content normal.        Judgment: Judgment normal.       Assessment and Plan:  Jennifer Cortez is a 33 y.o. female presenting to the [facility] Department for STI screening  1. Screening for STD (sexually transmitted disease) Patient is without symptoms today.   Counseled that due to hormonal changes and bleeding it is common to occasionally have a slight odor and variations in the amount of vaginal discharge. Rec condoms with all sex Await test results.  Counseled that RN will call if needs to RTC for any treatment once results are back.  - WET PREP FOR TRICH, YEAST, CLUE - Chlamydia/Gonorrhea [place] [facility] - Gonococcus culture     No follow-ups on file.  No future appointments.  Jerene Dilling, PA

## 2019-01-14 LAB — GONOCOCCUS CULTURE

## 2019-02-11 ENCOUNTER — Encounter: Payer: Self-pay | Admitting: Intensive Care

## 2019-02-11 ENCOUNTER — Other Ambulatory Visit: Payer: Self-pay

## 2019-02-11 ENCOUNTER — Emergency Department
Admission: EM | Admit: 2019-02-11 | Discharge: 2019-02-11 | Disposition: A | Discharge status: Home / Self Care | Payer: Medicaid Other | Attending: Emergency Medicine | Admitting: Emergency Medicine

## 2019-02-11 DIAGNOSIS — F17210 Nicotine dependence, cigarettes, uncomplicated: Secondary | ICD-10-CM | POA: Diagnosis not present

## 2019-02-11 DIAGNOSIS — Z79899 Other long term (current) drug therapy: Secondary | ICD-10-CM | POA: Insufficient documentation

## 2019-02-11 DIAGNOSIS — N939 Abnormal uterine and vaginal bleeding, unspecified: Secondary | ICD-10-CM | POA: Diagnosis not present

## 2019-02-11 LAB — COMPREHENSIVE METABOLIC PANEL
ALT: 17 U/L (ref 0–44)
AST: 22 U/L (ref 15–41)
Albumin: 3.9 g/dL (ref 3.5–5.0)
Alkaline Phosphatase: 84 U/L (ref 38–126)
Anion gap: 9 (ref 5–15)
BUN: 17 mg/dL (ref 6–20)
CO2: 27 mmol/L (ref 22–32)
Calcium: 9.3 mg/dL (ref 8.9–10.3)
Chloride: 103 mmol/L (ref 98–111)
Creatinine, Ser: 0.83 mg/dL (ref 0.44–1.00)
GFR calc Af Amer: >60 mL/min (ref >60)
GFR calc non Af Amer: >60 mL/min (ref >60)
Glucose, Bld: 89 mg/dL (ref 70–99)
Potassium: 3.7 mmol/L (ref 3.5–5.1)
Sodium: 139 mmol/L (ref 135–145)
Total Bilirubin: 0.5 mg/dL (ref 0.3–1.2)
Total Protein: 7.4 g/dL (ref 6.5–8.1)

## 2019-02-11 LAB — URINALYSIS, COMPLETE (UACMP) WITH MICROSCOPIC
Bilirubin Urine: NEGATIVE
Glucose, UA: NEGATIVE mg/dL
Ketones, ur: NEGATIVE mg/dL
Nitrite: NEGATIVE
Protein, ur: 100 mg/dL — AB
RBC / HPF: >50 RBC/hpf — ABNORMAL HIGH (ref 0–5)
Specific Gravity, Urine: 1.028 (ref 1.005–1.030)
pH: 5 (ref 5.0–8.0)

## 2019-02-11 LAB — CBC
HCT: 38 % (ref 36.0–46.0)
Hemoglobin: 12.6 g/dL (ref 12.0–15.0)
MCH: 31.2 pg (ref 26.0–34.0)
MCHC: 33.2 g/dL (ref 30.0–36.0)
MCV: 94.1 fL (ref 80.0–100.0)
Platelets: 345 10*3/uL (ref 150–400)
RBC: 4.04 MIL/uL (ref 3.87–5.11)
RDW: 12.3 % (ref 11.5–15.5)
WBC: 5.1 10*3/uL (ref 4.0–10.5)
nRBC: 0 % (ref 0.0–0.2)

## 2019-02-11 LAB — LIPASE, BLOOD: Lipase: 26 U/L (ref 11–51)

## 2019-02-11 LAB — POCT PREGNANCY, URINE: Preg Test, Ur: NEGATIVE

## 2019-02-11 NOTE — ED Triage Notes (Signed)
Patient had tubal ligation October 23rd 2018. Pt presents spotting the past couple of days and then today heavy period started. Abdominal cramping present

## 2019-02-11 NOTE — ED Notes (Signed)
Pt alert and oriented X 4, stable for discharge. RR even and unlabored, color WNL. Discussed discharge instructions and follow up when appropriate. Instructed to follow up with ER for any life threatening symptoms or concerns that patient or family of patient may have  

## 2019-02-11 NOTE — ED Provider Notes (Signed)
[facility] Emergency Department Provider Note   ____________________________________________    I have reviewed the triage vital signs and the nursing notes.   HISTORY  Chief Complaint Vaginal Bleeding     HPI Jennifer Cortez is a 33 y.o. female who presents with complaints of vaginal bleeding.  Patient reports she last had a normal period at the beginning of the month and typically her periods are fairly regular.  She reports mild bilateral cramping and mild to moderate flow.  Did notice a couple of blood clots.  Denies lightheadedness.  Has not take anything for this.  Past Medical History:  Diagnosis Date   Anxiety    Bipolar 1 disorder ([place])    Depression    Dermoid cyst of right ovary     5 cm dermoid in right ovary -> plan laparoscopic removal after delivery.   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 Authorizing Provider  amoxicillin (AMOXIL) 875 MG tablet Take 1 tablet (875 mg total) by mouth 2 (two) times daily. 06/16/17   Lysbeth Penner, FNP  HYDROcodone-acetaminophen (NORCO/VICODIN) 5-325 MG tablet Take 1 tablet by mouth every 4 (four) hours as needed for moderate pain. 08/28/18   Cuthriell, Charline Bills, PA-C  ibuprofen (ADVIL,MOTRIN) 800 MG tablet Take 1 tablet (800 mg total)  by mouth 3 (three) times daily. 06/16/17   Lysbeth Penner, FNP     Allergies Patient has no known allergies.  Family History  Problem Relation Age of Onset   Diabetes Maternal Grandmother    Hepatitis B Maternal Grandmother     Social History Social History   Tobacco Use   Smoking status: Current Every Day Smoker    Packs/day: 0.25    Types: Cigarettes   Smokeless tobacco: Never Used  Substance Use Topics   Alcohol use: Yes    Comment: occ   Drug use: Yes    Frequency: 7.0 times per week    Types: Marijuana    Comment: quit mj    Review of Systems  Constitutional: No fever/chills Eyes: No visual changes.  ENT: No sore throat. Cardiovascular: Denies chest pain. Respiratory: Denies shortness of breath. Gastrointestinal: As above Genitourinary: As above Musculoskeletal: Negative for back pain. Skin: Negative for rash. Neurological: Negative for headaches or weakness   ____________________________________________   PHYSICAL EXAM:  VITAL SIGNS: ED Triage Vitals  Enc Vitals Group     BP 02/11/19 1624 (!) 108/59     Pulse Rate 02/11/19 1624 92     Resp 02/11/19 1624 16     Temp 02/11/19 1624 98.7 F (37.1 C)     Temp Source 02/11/19 1624 Oral     SpO2 02/11/19 1624 100 %     Weight 02/11/19 1620  86.2 kg (190 lb)     Height 02/11/19 1620 1.676 m (5\' 6" )     Head Circumference --      Peak Flow --      Pain Score 02/11/19 1620 5     Pain Loc --      Pain Edu? --      Excl. in [place]? --     Constitutional: Alert and oriented.   Nose: No congestion/rhinnorhea. Mouth/Throat: Mucous membranes are moist.    Cardiovascular: Normal rate, regular rhythm. Grossly normal heart sounds.  Good peripheral circulation. Respiratory: Normal respiratory effort.  No retractions. Lungs CTAB. Gastrointestinal: Soft and nontender. No distention.  No CVA tenderness. Genitourinary: Bleeding coming from the os, no abnormalities Musculoskeletal:.  Warm and well  perfused Neurologic:  Normal speech and language. No gross focal neurologic deficits are appreciated.  Skin:  Skin is warm, dry and intact. No rash noted. Psychiatric: Mood and affect are normal. Speech and behavior are normal.  ____________________________________________   LABS (all labs ordered are listed, but only abnormal results are displayed)  Labs Reviewed  URINALYSIS, COMPLETE (UACMP) WITH MICROSCOPIC - Abnormal; Notable for the following components:      Result Value   Color, Urine YELLOW (*)    APPearance HAZY (*)    Hgb urine dipstick LARGE (*)    Protein, ur 100 (*)    Leukocytes,Ua SMALL (*)    RBC / HPF >50 (*)    Bacteria, UA FEW (*)    All other components within normal limits  LIPASE, BLOOD  COMPREHENSIVE METABOLIC PANEL  CBC  POC URINE PREG, ED  POCT PREGNANCY, URINE   ____________________________________________  EKG  None ____________________________________________  RADIOLOGY   ____________________________________________   PROCEDURES  Procedure(s) performed: No  Procedures   Critical Care performed: No ____________________________________________   INITIAL IMPRESSION / ASSESSMENT AND PLAN / ED COURSE  Pertinent labs & imaging results that were available during my care of the patient were reviewed by me and considered in my medical decision making (see chart for details).  Patient with abnormal uterine bleeding, hemoglobin is reassuring, no significant abnormality on exam.  Vital signs normal, she feels well.  Recommend follow-up with gynecology    ____________________________________________   FINAL CLINICAL IMPRESSION(S) / ED DIAGNOSES  Final diagnoses:  Abnormal uterine bleeding        Note:  This document was prepared using Dragon voice recognition software and may include unintentional dictation errors.   Lavonia Drafts, MD 02/11/19 [phone]

## 2019-03-21 ENCOUNTER — Ambulatory Visit: Payer: Medicaid Other

## 2019-03-24 ENCOUNTER — Ambulatory Visit: Payer: Medicaid Other | Admitting: Advanced Practice Midwife

## 2019-03-24 ENCOUNTER — Other Ambulatory Visit: Payer: Self-pay

## 2019-03-24 DIAGNOSIS — A599 Trichomoniasis, unspecified: Secondary | ICD-10-CM | POA: Diagnosis not present

## 2019-03-24 DIAGNOSIS — Z113 Encounter for screening for infections with a predominantly sexual mode of transmission: Secondary | ICD-10-CM | POA: Diagnosis not present

## 2019-03-24 DIAGNOSIS — F319 Bipolar disorder, unspecified: Secondary | ICD-10-CM | POA: Insufficient documentation

## 2019-03-24 DIAGNOSIS — Z9851 Tubal ligation status: Secondary | ICD-10-CM

## 2019-03-24 DIAGNOSIS — F419 Anxiety disorder, unspecified: Secondary | ICD-10-CM

## 2019-03-24 DIAGNOSIS — B9689 Other specified bacterial agents as the cause of diseases classified elsewhere: Secondary | ICD-10-CM

## 2019-03-24 DIAGNOSIS — T7421XA Adult sexual abuse, confirmed, initial encounter: Secondary | ICD-10-CM

## 2019-03-24 DIAGNOSIS — N760 Acute vaginitis: Secondary | ICD-10-CM

## 2019-03-24 DIAGNOSIS — T7421XS Adult sexual abuse, confirmed, sequela: Secondary | ICD-10-CM

## 2019-03-24 DIAGNOSIS — Z91410 Personal history of adult physical and sexual abuse: Secondary | ICD-10-CM

## 2019-03-24 DIAGNOSIS — F17200 Nicotine dependence, unspecified, uncomplicated: Secondary | ICD-10-CM

## 2019-03-24 DIAGNOSIS — F1290 Cannabis use, unspecified, uncomplicated: Secondary | ICD-10-CM

## 2019-03-24 HISTORY — DX: Adult sexual abuse, confirmed, initial encounter: T74.21XA

## 2019-03-24 HISTORY — DX: Personal history of adult physical and sexual abuse: Z91.410

## 2019-03-24 HISTORY — DX: Tubal ligation status: Z98.51

## 2019-03-24 LAB — WET PREP FOR TRICH, YEAST, CLUE
Trichomonas Exam: POSITIVE — AB
Yeast Exam: NEGATIVE

## 2019-03-24 MED ORDER — METRONIDAZOLE 500 MG PO TABS: 500.0000 mg | ORAL_TABLET | Freq: Two times a day (BID) | ORAL | 0 refills | Status: AC

## 2019-03-24 NOTE — Progress Notes (Signed)
STI clinic/screening visit  Subjective:  Jennifer Cortez is a 33 y.o.G10P6 smoker female being seen today for an STI screening visit. The patient reports they do have symptoms.  Patient has the following medical conditions:   Patient Active Problem List   Diagnosis Date Noted  . Smoker 3-5 cpd 03/24/2019  . History of bilateral tubal ligation 2018 03/24/2019  . Marijuana use 03/24/2019  . Bipolar 1 disorder ([place]) dx'd 2014 03/24/2019  . Anxiety dx'd 2014 03/24/2019  . 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 Moderate episode of recurrent major depressive disorder ([place]) 12/12/2016     No chief complaint on file.   HPI  Patient reports occassional abdominal cramping, increased d/c with malodor past 1-2 wks.  Last took antibiotic for UTI 1 1/2 wks ago.  Last MJ yesterday. Last ETOH 03/01/19 (2 glasses vodka)q 3 wks  See flowsheet for further details and programmatic requirements.    The following portions of the patient's history were reviewed and updated as appropriate: allergies, current medications, past medical history, past social history, past surgical history and problem list.  Objective:  There were no vitals filed for this visit.  Physical Exam Vitals signs and nursing note reviewed.  Constitutional:      Appearance: Normal appearance.  HENT:     Head: Normocephalic and atraumatic.     Mouth/Throat:     Mouth: Mucous membranes are moist.     Pharynx: Oropharynx is clear. No oropharyngeal exudate or posterior oropharyngeal erythema.  Pulmonary:     Effort: Pulmonary effort is normal.  Abdominal:     General: Abdomen is flat.     Palpations: Abdomen is soft. There is no mass.     Tenderness: There is no abdominal tenderness. There is no rebound.     Comments: Soft without tenderness  Genitourinary:    General: Normal vulva.     Exam position: Lithotomy position.     Pubic Area: No rash or pubic lice.      Labia:        Right: No rash or lesion.        Left: No rash  or lesion.      Vagina: Normal. No vaginal discharge (grey sl malodorous leukorrhea, ph>4.5), erythema, bleeding or lesions.     Cervix: No cervical motion tenderness, discharge, friability, lesion or erythema.     Uterus: Normal.      Adnexa: Right adnexa normal and left adnexa normal.     Rectum: Normal.  Lymphadenopathy:     Head:     Right side of head: No preauricular or posterior auricular adenopathy.     Left side of head: No preauricular or posterior auricular adenopathy.     Cervical: No cervical adenopathy.     Upper Body:     Right upper body: No supraclavicular or axillary adenopathy.     Left upper body: No supraclavicular or axillary adenopathy.     Lower Body: No right inguinal adenopathy. No left inguinal adenopathy.  Skin:    General: Skin is warm and dry.     Findings: No rash.  Neurological:     Mental Status: She is alert and oriented to person, place, and time.       Assessment and Plan:  Jennifer Cortez is a 33 y.o. female presenting to the [facility] Department for STI screening  1. Smoker 3-5 cpd Counseled via 5 A's to stop smoking  2. History of bilateral tubal ligation 2018  3. Marijuana use Last use yesterday  4. Bipolar 1 disorder ([place]) dx'd 2014 No meds  5. Anxiety dx'd 2014 No meds  6.  STD screening Treat wet mount per standing orders Immunization nurse consult     No follow-ups on file.  No future appointments.  Herbie Saxon, CNM

## 2019-03-24 NOTE — Progress Notes (Signed)
Wet mount reviewed and pt received treatment for BV and Trich per E. Sciora, CNM verbal order with Metronidazole 500mg  #14, 1 tab po BID x7 days. Provider orders completed.Ronny Bacon, RN

## 2019-05-26 ENCOUNTER — Other Ambulatory Visit: Payer: Self-pay

## 2019-05-26 ENCOUNTER — Ambulatory Visit: Payer: Medicaid Other | Admitting: Physician Assistant

## 2019-05-26 DIAGNOSIS — Z299 Encounter for prophylactic measures, unspecified: Secondary | ICD-10-CM

## 2019-05-26 DIAGNOSIS — Z113 Encounter for screening for infections with a predominantly sexual mode of transmission: Secondary | ICD-10-CM | POA: Diagnosis not present

## 2019-05-26 LAB — WET PREP FOR TRICH, YEAST, CLUE
Trichomonas Exam: NEGATIVE
Yeast Exam: NEGATIVE

## 2019-05-27 ENCOUNTER — Encounter: Payer: Self-pay | Admitting: Physician Assistant

## 2019-05-27 MED ORDER — ACYCLOVIR 400 MG PO TABS: 400.0000 mg | ORAL_TABLET | Freq: Three times a day (TID) | ORAL | 0 refills | Status: AC

## 2019-05-27 MED ORDER — ACYCLOVIR 800 MG PO TABS: 800.0000 mg | ORAL_TABLET | Freq: Three times a day (TID) | ORAL | 12 refills | Status: AC
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
## 2019-05-30 LAB — GONOCOCCUS CULTURE

## 2019-06-29 NOTE — Progress Notes (Signed)
Chart reviewed by Pharmacist  Suzanne Walker PharmD, Contract Pharmacist at [facility]  
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
## 2019-12-09 ENCOUNTER — Other Ambulatory Visit: Payer: Self-pay

## 2019-12-09 ENCOUNTER — Encounter (HOSPITAL_COMMUNITY): Payer: Self-pay | Admitting: Registered Nurse

## 2019-12-09 ENCOUNTER — Ambulatory Visit (HOSPITAL_COMMUNITY)
Admission: EM | Admit: 2019-12-09 | Discharge: 2019-12-10 | Disposition: A | Discharge status: Other Acute Inpatient Hospital | Payer: Medicaid Other | Attending: Registered Nurse | Admitting: Registered Nurse

## 2019-12-09 DIAGNOSIS — F1994 Other psychoactive substance use, unspecified with psychoactive substance-induced mood disorder: Secondary | ICD-10-CM | POA: Diagnosis present

## 2019-12-09 DIAGNOSIS — Z20822 Contact with and (suspected) exposure to covid-19: Secondary | ICD-10-CM | POA: Diagnosis not present

## 2019-12-09 DIAGNOSIS — F1220 Cannabis dependence, uncomplicated: Secondary | ICD-10-CM | POA: Insufficient documentation

## 2019-12-09 DIAGNOSIS — Z560 Unemployment, unspecified: Secondary | ICD-10-CM | POA: Insufficient documentation

## 2019-12-09 DIAGNOSIS — R45851 Suicidal ideations: Secondary | ICD-10-CM | POA: Insufficient documentation

## 2019-12-09 DIAGNOSIS — F1620 Hallucinogen dependence, uncomplicated: Secondary | ICD-10-CM | POA: Diagnosis not present

## 2019-12-09 DIAGNOSIS — F1920 Other psychoactive substance dependence, uncomplicated: Secondary | ICD-10-CM | POA: Diagnosis present

## 2019-12-09 DIAGNOSIS — F419 Anxiety disorder, unspecified: Secondary | ICD-10-CM | POA: Insufficient documentation

## 2019-12-09 DIAGNOSIS — F1290 Cannabis use, unspecified, uncomplicated: Secondary | ICD-10-CM | POA: Diagnosis present

## 2019-12-09 DIAGNOSIS — F331 Major depressive disorder, recurrent, moderate: Secondary | ICD-10-CM | POA: Diagnosis present

## 2019-12-09 DIAGNOSIS — F1420 Cocaine dependence, uncomplicated: Secondary | ICD-10-CM | POA: Insufficient documentation

## 2019-12-09 DIAGNOSIS — Z79899 Other long term (current) drug therapy: Secondary | ICD-10-CM | POA: Insufficient documentation

## 2019-12-09 DIAGNOSIS — F1924 Other psychoactive substance dependence with psychoactive substance-induced mood disorder: Secondary | ICD-10-CM | POA: Diagnosis not present

## 2019-12-09 DIAGNOSIS — F17210 Nicotine dependence, cigarettes, uncomplicated: Secondary | ICD-10-CM | POA: Diagnosis not present

## 2019-12-09 LAB — COMPREHENSIVE METABOLIC PANEL
ALT: 19 U/L (ref 0–44)
AST: 21 U/L (ref 15–41)
Albumin: 3.9 g/dL (ref 3.5–5.0)
Alkaline Phosphatase: 88 U/L (ref 38–126)
Anion gap: 6 (ref 5–15)
BUN: 19 mg/dL (ref 6–20)
CO2: 29 mmol/L (ref 22–32)
Calcium: 9.5 mg/dL (ref 8.9–10.3)
Chloride: 103 mmol/L (ref 98–111)
Creatinine, Ser: 0.73 mg/dL (ref 0.44–1.00)
GFR calc Af Amer: >60 mL/min (ref >60)
GFR calc non Af Amer: >60 mL/min (ref >60)
Glucose, Bld: 91 mg/dL (ref 70–99)
Potassium: 4.1 mmol/L (ref 3.5–5.1)
Sodium: 138 mmol/L (ref 135–145)
Total Bilirubin: 0.5 mg/dL (ref 0.3–1.2)
Total Protein: 7.6 g/dL (ref 6.5–8.1)

## 2019-12-09 LAB — POC SARS CORONAVIRUS 2 AG -  ED: SARS Coronavirus 2 Ag: NEGATIVE

## 2019-12-09 LAB — HEMOGLOBIN A1C
Hgb A1c MFr Bld: 5 % (ref 4.8–5.6)
Mean Plasma Glucose: 96.8 mg/dL

## 2019-12-09 LAB — POCT URINE DRUG SCREEN - MANUAL ENTRY (I-SCREEN)
POC Amphetamine UR: NOT DETECTED
POC Buprenorphine (BUP): NOT DETECTED
POC Cocaine UR: POSITIVE — AB
POC Marijuana UR: POSITIVE — AB
POC Methadone UR: NOT DETECTED
POC Methamphetamine UR: NOT DETECTED
POC Morphine: NOT DETECTED
POC Oxazepam (BZO): NOT DETECTED
POC Oxycodone UR: NOT DETECTED
POC Secobarbital (BAR): NOT DETECTED

## 2019-12-09 LAB — CBC WITH DIFFERENTIAL/PLATELET
Abs Immature Granulocytes: 0.02 10*3/uL (ref 0.00–0.07)
Basophils Absolute: 0 10*3/uL (ref 0.0–0.1)
Basophils Relative: 0 %
Eosinophils Absolute: 0.2 10*3/uL (ref 0.0–0.5)
Eosinophils Relative: 2 %
HCT: 40.4 % (ref 36.0–46.0)
Hemoglobin: 12.9 g/dL (ref 12.0–15.0)
Immature Granulocytes: 0 %
Lymphocytes Relative: 35 %
Lymphs Abs: 2.4 10*3/uL (ref 0.7–4.0)
MCH: 31 pg (ref 26.0–34.0)
MCHC: 31.9 g/dL (ref 30.0–36.0)
MCV: 97.1 fL (ref 80.0–100.0)
Monocytes Absolute: 0.5 10*3/uL (ref 0.1–1.0)
Monocytes Relative: 7 %
Neutro Abs: 3.8 10*3/uL (ref 1.7–7.7)
Neutrophils Relative %: 56 %
Platelets: 453 10*3/uL — ABNORMAL HIGH (ref 150–400)
RBC: 4.16 MIL/uL (ref 3.87–5.11)
RDW: 12.8 % (ref 11.5–15.5)
WBC: 6.9 10*3/uL (ref 4.0–10.5)
nRBC: 0 % (ref 0.0–0.2)

## 2019-12-09 LAB — POC SARS CORONAVIRUS 2 AG: SARS Coronavirus 2 Ag: NEGATIVE

## 2019-12-09 LAB — TSH: TSH: 0.507 u[IU]/mL (ref 0.350–4.500)

## 2019-12-09 LAB — LIPID PANEL
Cholesterol: 156 mg/dL (ref 0–200)
HDL: 49 mg/dL (ref >40)
LDL Cholesterol: 90 mg/dL (ref 0–99)
Total CHOL/HDL Ratio: 3.2 RATIO
Triglycerides: 85 mg/dL (ref <150)
VLDL: 17 mg/dL (ref 0–40)

## 2019-12-09 LAB — SARS CORONAVIRUS 2 BY RT PCR (HOSPITAL ORDER, PERFORMED IN ~~LOC~~ HOSPITAL LAB): SARS Coronavirus 2: NEGATIVE

## 2019-12-09 LAB — ETHANOL: Alcohol, Ethyl (B): <10 mg/dL (ref <10)

## 2019-12-09 MED ORDER — HYDROXYZINE HCL 25 MG PO TABS
25.0000 mg | ORAL_TABLET | Freq: Three times a day (TID) | ORAL | Status: DC | PRN
  Filled 2019-12-09: qty 1

## 2019-12-09 MED ORDER — TRAZODONE HCL 50 MG PO TABS
50.0000 mg | ORAL_TABLET | Freq: Every evening | ORAL | Status: DC | PRN
  Administered 2019-12-09: 50 mg via ORAL
  Filled 2019-12-09: qty 1
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
## 2019-12-09 NOTE — ED Notes (Signed)
Patient belongings in locker 32 

## 2019-12-09 NOTE — ED Notes (Signed)
Pt A&Ox4, calm cooperative throughout assessment. Reports passive SI. Denies HI/AVH. Pt states, I was just feeling a little overwhelmed at home and I used cocaine recently. Denies plan. Safety maintained

## 2019-12-09 NOTE — ED Notes (Signed)
Pt given snack of chips & juice

## 2019-12-10 ENCOUNTER — Inpatient Hospital Stay (HOSPITAL_COMMUNITY)
Admission: AD | Admit: 2019-12-10 | Discharge: 2019-12-15 | DRG: 885 | Disposition: A | Discharge status: Home / Self Care | Payer: Medicaid Other | Source: Intra-hospital | Attending: Psychiatry | Admitting: Psychiatry

## 2019-12-10 ENCOUNTER — Encounter (HOSPITAL_COMMUNITY): Payer: Self-pay | Admitting: Psychiatry

## 2019-12-10 DIAGNOSIS — F419 Anxiety disorder, unspecified: Secondary | ICD-10-CM | POA: Diagnosis present

## 2019-12-10 DIAGNOSIS — F1424 Cocaine dependence with cocaine-induced mood disorder: Secondary | ICD-10-CM | POA: Diagnosis present

## 2019-12-10 DIAGNOSIS — Z20822 Contact with and (suspected) exposure to covid-19: Secondary | ICD-10-CM | POA: Diagnosis present

## 2019-12-10 DIAGNOSIS — G4700 Insomnia, unspecified: Secondary | ICD-10-CM | POA: Diagnosis present

## 2019-12-10 DIAGNOSIS — A6009 Herpesviral infection of other urogenital tract: Secondary | ICD-10-CM | POA: Diagnosis present

## 2019-12-10 DIAGNOSIS — F17210 Nicotine dependence, cigarettes, uncomplicated: Secondary | ICD-10-CM | POA: Diagnosis present

## 2019-12-10 DIAGNOSIS — F322 Major depressive disorder, single episode, severe without psychotic features: Secondary | ICD-10-CM | POA: Diagnosis present

## 2019-12-10 DIAGNOSIS — Z818 Family history of other mental and behavioral disorders: Secondary | ICD-10-CM

## 2019-12-10 DIAGNOSIS — Z79899 Other long term (current) drug therapy: Secondary | ICD-10-CM

## 2019-12-10 DIAGNOSIS — F1994 Other psychoactive substance use, unspecified with psychoactive substance-induced mood disorder: Secondary | ICD-10-CM | POA: Diagnosis present

## 2019-12-10 DIAGNOSIS — F1924 Other psychoactive substance dependence with psychoactive substance-induced mood disorder: Secondary | ICD-10-CM | POA: Diagnosis not present

## 2019-12-10 DIAGNOSIS — F1423 Cocaine dependence with withdrawal: Secondary | ICD-10-CM | POA: Diagnosis present

## 2019-12-10 DIAGNOSIS — R45851 Suicidal ideations: Secondary | ICD-10-CM | POA: Diagnosis present

## 2019-12-10 DIAGNOSIS — F319 Bipolar disorder, unspecified: Secondary | ICD-10-CM | POA: Diagnosis present

## 2019-12-10 LAB — POCT PREGNANCY, URINE: Preg Test, Ur: NEGATIVE

## 2019-12-10 MED ORDER — MAGNESIUM HYDROXIDE 400 MG/5ML PO SUSP
30.0000 mL | Freq: Every day | ORAL | Status: DC | PRN
  Administered 2019-12-12: 30 mL via ORAL

## 2019-12-10 MED ORDER — ESCITALOPRAM OXALATE 10 MG PO TABS
10.0000 mg | ORAL_TABLET | Freq: Every day | ORAL | Status: DC
  Administered 2019-12-11 – 2019-12-15 (×5): 10 mg via ORAL
  Filled 2019-12-10 (×9): qty 1

## 2019-12-10 MED ORDER — TRAZODONE HCL 50 MG PO TABS
50.0000 mg | ORAL_TABLET | Freq: Every evening | ORAL | Status: DC | PRN
  Administered 2019-12-10 – 2019-12-11 (×2): 50 mg via ORAL
  Filled 2019-12-10 (×2): qty 1

## 2019-12-10 MED ORDER — GABAPENTIN 100 MG PO CAPS
100.0000 mg | ORAL_CAPSULE | Freq: Three times a day (TID) | ORAL | Status: DC
  Administered 2019-12-10 – 2019-12-15 (×15): 100 mg via ORAL
  Filled 2019-12-10 (×22): qty 1

## 2019-12-10 MED ORDER — GABAPENTIN 100 MG PO CAPS
100.0000 mg | ORAL_CAPSULE | Freq: Three times a day (TID) | ORAL | Status: DC
  Administered 2019-12-10: 100 mg via ORAL
  Filled 2019-12-10 (×2): qty 1

## 2019-12-10 MED ORDER — ACETAMINOPHEN 325 MG PO TABS
650.0000 mg | ORAL_TABLET | Freq: Four times a day (QID) | ORAL | Status: DC | PRN
  Administered 2019-12-15: 650 mg via ORAL
  Filled 2019-12-10: qty 2

## 2019-12-10 MED ORDER — ALUM & MAG HYDROXIDE-SIMETH 200-200-20 MG/5ML PO SUSP: 30.0000 mL | ORAL | Status: DC | PRN

## 2019-12-10 MED ORDER — HYDROXYZINE HCL 25 MG PO TABS
25.0000 mg | ORAL_TABLET | Freq: Three times a day (TID) | ORAL | Status: DC | PRN
  Administered 2019-12-10 – 2019-12-14 (×4): 25 mg via ORAL
  Filled 2019-12-10 (×4): qty 1

## 2019-12-10 MED ORDER — ESCITALOPRAM OXALATE 10 MG PO TABS
10.0000 mg | ORAL_TABLET | Freq: Every day | ORAL | Status: DC
  Administered 2019-12-10: 10 mg via ORAL
  Filled 2019-12-10 (×2): qty 1

## 2019-12-10 NOTE — ED Notes (Signed)
Report called to [facility]. Safe transport called to transport patient.
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
## 2019-12-10 NOTE — ED Notes (Addendum)
Lunch was given, which was a salad, cranberry juice and a pack of cookies.

## 2019-12-10 NOTE — ED Notes (Signed)
Received called from [facility] that there is an hour delay in transports. Provider Marvia Pickles NP made aware.

## 2019-12-10 NOTE — ED Notes (Signed)
Patient discharged to [facility]. Patient aware of discharge and voices no concerns. [facility] notified that patient is on the way via safe transport. Patient escorted off unit by staff to meet safe transport driver.

## 2019-12-10 NOTE — Tx Team (Signed)
Initial Treatment Plan 12/10/2019 4:42 PM Halle A Cordy BPJ:[id number]    PATIENT STRESSORS: Financial difficulties Health problems Occupational concerns Substance abuse   PATIENT STRENGTHS: Ability for insight Average or above average intelligence Communication skills Motivation for treatment/growth   PATIENT IDENTIFIED PROBLEMS: "Coping skills"  "Racing thoughts"  Depression  Suicidal ideation  Substance Abuse             DISCHARGE CRITERIA:  Ability to meet basic life and health needs Adequate post-discharge living arrangements Motivation to continue treatment in a less acute level of care  PRELIMINARY DISCHARGE PLAN: Attend aftercare/continuing care group Outpatient therapy Placement in alternative living arrangements  PATIENT/FAMILY INVOLVEMENT: This treatment plan has been presented to and reviewed with the patient, Rondell A Mcguirk, and/or family member.  The patient and family have been given the opportunity to ask questions and make suggestions.  Vela Prose, RN 12/10/2019, 4:42 PM

## 2019-12-10 NOTE — ED Notes (Signed)
Pt asleep with even and unlabored respirations. No distress or discomfort noted. Pt remains safe on the unit.

## 2019-12-10 NOTE — ED Notes (Signed)
Pt given breakfast; cereal, orange juice

## 2019-12-11 DIAGNOSIS — F1994 Other psychoactive substance use, unspecified with psychoactive substance-induced mood disorder: Secondary | ICD-10-CM

## 2019-12-11 DIAGNOSIS — F322 Major depressive disorder, single episode, severe without psychotic features: Secondary | ICD-10-CM

## 2019-12-11 NOTE — Plan of Care (Signed)
Nurse discussed anxiety, depression and coping skills with patient.  

## 2019-12-11 NOTE — Progress Notes (Signed)
Patient remains cooperative with treatment. She was visible in the milieu interacting well with peers and staff and she was medication complaint on shift. She denies SI/AVH/HI. Mood is improving and anxiety level is improving. She appears to be in bed eyes closed resting quietly on shift with no issues to report at this time
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
## 2019-12-11 NOTE — Progress Notes (Signed)
Adult Psychoeducational Group Note  Date:  12/11/2019 Time:  8:31 PM  Group Topic/Focus:  Wrap-Up Group:   The focus of this group is to help patients review their daily goal of treatment and discuss progress on daily workbooks.  Participation Level:  Active  Participation Quality:  Appropriate  Affect:  Appropriate  Cognitive:  Appropriate  Insight: Appropriate  Engagement in Group:  Engaged  Modes of Intervention:  Discussion  Additional Comments:  In group pt's shared why they are here and how they plan on working towards their goal. Group was more of a group discussion than individually speaking.  Veronda Prude 12/11/2019, 8:31 PM

## 2019-12-11 NOTE — Progress Notes (Signed)
D:  Patient's self inventory sheet, patient has fair sleep, sleep medication helpful.  Good appetite, low energy level, good concentration.  Rated depression and anxiety 5, hopeless 4.  Denied withdrawals.  Denied SI, then checked sometimes, contracts for safety.  Denied physical problems.  Feels "somewhat tired, low energy."  Denied physical pain.  Goal is "raising my energy level, identifying new ways to cope.  I will stay out of bed and engage in different activities.  A:  Medications administered per MD orders.  Emotional support and encouragement given patient. R:  Denied SI and HI, contracts for safety.  Denied A/V hallucinations.  Safety maintained with 15 minute checks.
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
## 2019-12-12 MED ORDER — VALACYCLOVIR HCL 500 MG PO TABS
500.0000 mg | ORAL_TABLET | Freq: Two times a day (BID) | ORAL | Status: DC
  Administered 2019-12-12 – 2019-12-15 (×6): 500 mg via ORAL
  Filled 2019-12-12 (×11): qty 1

## 2019-12-12 MED ORDER — TRAZODONE HCL 100 MG PO TABS: 100.0000 mg | ORAL_TABLET | Freq: Every evening | ORAL | Status: DC | PRN

## 2019-12-12 MED ORDER — TRAZODONE HCL 150 MG PO TABS
150.0000 mg | ORAL_TABLET | Freq: Every day | ORAL | Status: DC
  Administered 2019-12-12 – 2019-12-14 (×3): 150 mg via ORAL
  Filled 2019-12-12 (×5): qty 1
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
## 2019-12-12 NOTE — Tx Team (Signed)
Interdisciplinary Treatment and Diagnostic Plan Update  12/12/2019 Time of Session: 9:10am Jennifer Cortez MRN: [id number]  Principal Diagnosis: MDD (major depressive disorder), severe ([place])  Secondary Diagnoses: Principal Problem:   MDD (major depressive disorder), severe ([place]) Active Problems:   Substance induced mood disorder ([place])   Current Medications:  Current Facility-Administered Medications  Medication Dose Route Frequency Provider Last Rate Last Admin  . acetaminophen (TYLENOL) tablet 650 mg  650 mg Oral Q6H PRN Money, Lowry Ram, FNP      . alum & mag hydroxide-simeth (MAALOX/MYLANTA) 200-200-20 MG/5ML suspension 30 mL  30 mL Oral Q4H PRN Money, Lowry Ram, FNP      . escitalopram (LEXAPRO) tablet 10 mg  10 mg Oral Daily Money, Lowry Ram, FNP   10 mg at 12/12/19 0939  . gabapentin (NEURONTIN) capsule 100 mg  100 mg Oral TID Money, Lowry Ram, FNP   100 mg at 12/12/19 1937  . hydrOXYzine (ATARAX/VISTARIL) tablet 25 mg  25 mg Oral TID PRN Money, Lowry Ram, FNP   25 mg at 12/11/19 2126  . magnesium hydroxide (MILK OF MAGNESIA) suspension 30 mL  30 mL Oral Daily PRN Money, Lowry Ram, FNP      . traZODone (DESYREL) tablet 100 mg  100 mg Oral QHS PRN Sharma Covert, MD       PTA Medications: Medications Prior to Admission  Medication Sig Dispense Refill Last Dose  . amoxicillin (AMOXIL) 875 MG tablet Take 1 tablet (875 mg total) by mouth 2 (two) times daily. (Patient not taking: Reported on 12/10/2019) 20 tablet 0   . HYDROcodone-acetaminophen (NORCO/VICODIN) 5-325 MG tablet Take 1 tablet by mouth every 4 (four) hours as needed for moderate pain. (Patient not taking: Reported on 12/10/2019) 16 tablet 0   . ibuprofen (ADVIL,MOTRIN) 800 MG tablet Take 1 tablet (800 mg total) by mouth 3 (three) times daily. 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   Physician Treatment Plan for Primary Diagnosis: MDD (major depressive disorder), severe ([place]) Long Term Goal(s): Improvement in symptoms so as ready for discharge Improvement in symptoms so as ready for discharge   Short Term Goals: Ability to identify changes in lifestyle to reduce recurrence of condition will improve Ability to verbalize feelings will improve Ability to disclose and discuss suicidal ideas Ability to demonstrate self-control will improve Ability to identify and develop effective coping behaviors will improve Compliance with prescribed medications will improve Ability to identify triggers associated with substance abuse/mental health issues will improve  Medication Management: Evaluate patient's response, side effects, and tolerance of medication regimen.  Therapeutic Interventions: 1 to 1 sessions, Unit Group sessions and Medication administration.  Evaluation of Outcomes: Not Met  Physician Treatment Plan for Secondary Diagnosis: Principal Problem:   MDD (major depressive disorder), severe ([place]) Active Problems:   Substance induced mood disorder ([place])  Long Term Goal(s): Improvement in symptoms so as ready for discharge Improvement in symptoms so as ready for discharge   Short Term Goals: Ability to identify changes in lifestyle to reduce recurrence of condition will improve Ability to verbalize feelings will improve Ability to disclose and discuss suicidal ideas Ability to demonstrate self-control will improve Ability to identify and develop effective  coping behaviors will improve Compliance with prescribed medications will improve Ability to identify triggers associated with substance abuse/mental  health issues will improve     Medication Management: Evaluate patient's response, side effects, and tolerance of medication regimen.  Therapeutic Interventions: 1 to 1 sessions, Unit Group sessions and Medication administration.  Evaluation of Outcomes: Not Met   RN Treatment Plan for Primary Diagnosis: MDD (major depressive disorder), severe ([place]) Long Term Goal(s): Knowledge of disease and therapeutic regimen to maintain health will improve  Short Term Goals: Ability to remain free from injury will improve, Ability to verbalize frustration and anger appropriately will improve, Ability to identify and develop effective coping behaviors will improve and Compliance with prescribed medications will improve  Medication Management: RN will administer medications as ordered by provider, will assess and evaluate patient's response and provide education to patient for prescribed medication. RN will report any adverse and/or side effects to prescribing provider.  Therapeutic Interventions: 1 on 1 counseling sessions, Psychoeducation, Medication administration, Evaluate responses to treatment, Monitor vital signs and CBGs as ordered, Perform/monitor CIWA, COWS, AIMS and Fall Risk screenings as ordered, Perform wound care treatments as ordered.  Evaluation of Outcomes: Not Met   LCSW Treatment Plan for Primary Diagnosis: MDD (major depressive disorder), severe ([place]) Long Term Goal(s): Safe transition to appropriate next level of care at discharge, Engage patient in therapeutic group addressing interpersonal concerns.  Short Term Goals: Engage patient in aftercare planning with referrals and resources, Increase social support, Identify triggers associated with mental health/substance abuse issues and Increase skills for wellness and recovery  Therapeutic Interventions: Assess for all discharge needs, 1 to 1 time with Social worker, Explore available resources and support systems, Assess for adequacy  in community support network, Educate family and significant other(s) on suicide prevention, Complete Psychosocial Assessment, Interpersonal group therapy.  Evaluation of Outcomes: Not Met   Progress in Treatment: Attending groups: Yes. Participating in groups: Yes. Taking medication as prescribed: Yes. Toleration medication: Yes. Family/Significant other contact made: No, will contact:  if consent is provided. Patient understands diagnosis: Yes. Discussing patient identified problems/goals with staff: No. Medical problems stabilized or resolved: Yes. Denies suicidal/homicidal ideation: Yes. Issues/concerns per patient self-inventory: No.   New problem(s) identified: Yes, Describe:  Patient has 6 children that live with her. This patient also reports high risk behaviors inscluding cocaine use and prostitution.   New Short Term/Long Term Goal(s): medication stabilization, elimination of SI thoughts, development of comprehensive mental wellness plan.   Patient Goals:  Patient did not attend.  Discharge Plan or Barriers:   Reason for Continuation of Hospitalization: Depression Medication stabilization  Estimated Length of Stay: 3-5 days  Attendees: Patient: Did not attend. 12/12/2019   Physician: Myles Lipps, MD 12/12/2019   Nursing:  12/12/2019   RN Care Manager: 12/12/2019   Social Worker: Darletta Moll, LCSW 12/12/2019   Recreational Therapist:  12/12/2019   Other:  12/12/2019   Other:  12/12/2019   Other: 12/12/2019       Scribe for Treatment Team: Vassie Moselle, LCSW 12/12/2019 10:40 AM

## 2019-12-12 NOTE — Progress Notes (Signed)
[facility] MD Progress Note  12/12/2019 12:10 PM Jennifer Cortez  MRN:  [id number] Subjective: Patient is a 34 year old female with a past psychiatric history significant for cocaine use disorder, marijuana use disorder and suicidal ideation.  She was admitted on 12/11/2019.  Objective: Patient is seen and examined.  Patient is a 34 year old female with the above-stated past psychiatric history is seen in follow-up.  She states she is doing well.  She states she is not having suicidal ideation.  She states she does feels some fatigue, but may be related to her cocaine withdrawal.  She did state that she felt as though she was beginning to develop a herpetic outbreak, and we discussed restarting her Valtrex.  Her vital signs are stable, she is afebrile.  She stated she slept fine, but I see in the nursing notes she only got 4 hours of sleep.  Review of her laboratories showed no new laboratories.  Her admission EKG is unable to be visualized at this time.  Principal Problem: MDD (major depressive disorder), severe ([place]) Diagnosis: Principal Problem:   MDD (major depressive disorder), severe ([place]) Active Problems:   Substance induced mood disorder ([place])  Total Time spent with patient: 15 minutes  Past Psychiatric History: See admission H&P  Past Medical History:  Past Medical History:  Diagnosis Date  . Anxiety   . Bipolar 1 disorder ([place])   . Depression   . Dermoid cyst of right ovary     5 cm dermoid in right ovary -> plan laparoscopic removal after delivery.  [place] stones   . HSV (herpes simplex virus) infection     Past Surgical History:  Procedure Laterality Date  . CESAREAN SECTION N/A 02/20/2017   Procedure: CESAREAN SECTION;  Surgeon: Truett Mainland, DO;  Location: [place];  Service: Obstetrics;  Laterality: N/A;  . DILATION AND CURETTAGE OF UTERUS    . gall stones    . MULTIPLE TOOTH EXTRACTIONS    . TUBAL LIGATION     Family History:  Family History  Problem  Relation Age of Onset  . Diabetes Maternal Grandmother   . Hepatitis B Maternal Grandmother    Family Psychiatric  History: See admission H&P Social History:  Social History   Substance and Sexual Activity  Alcohol Use Yes   Comment: occ     Social History   Substance and Sexual Activity  Drug Use Yes  . Frequency: 7.0 times per week  . Types: Marijuana   Comment: quit mj    Social History   Socioeconomic History  . Marital status: Single    Spouse name: Not on file  . Number of children: Not on file  . Years of education: Not on file  . Highest education level: Not on file  Occupational History  . Not on file  Tobacco Use  . Smoking status: Current Every Day Smoker    Packs/day: 0.25    Types: Cigarettes  . Smokeless tobacco: Never Used  Substance and Sexual Activity  . Alcohol use: Yes    Comment: occ  . Drug use: Yes    Frequency: 7.0 times per week    Types: Marijuana    Comment: quit mj  . Sexual activity: Not Currently    Birth control/protection: None  Other Topics Concern  . Not on file  Social History Narrative  . Not on file   Social Determinants of Health   Financial Resource Strain:   . Difficulty of Paying Living Expenses:  Food Insecurity:   . Worried About Charity fundraiser in the Last Year:   . Arboriculturist in the Last Year:   Transportation Needs:   . Film/video editor (Medical):   Marland Kitchen Lack of Transportation (Non-Medical):   Physical Activity:   . Days of Exercise per Week:   . Minutes of Exercise per Session:   Stress:   . Feeling of Stress :   Social Connections:   . Frequency of Communication with Friends and Family:   . Frequency of Social Gatherings with Friends and Family:   . Attends Religious Services:   . Active Member of Clubs or Organizations:   . Attends Archivist Meetings:   Marland Kitchen Marital Status:    Additional Social History:                         Sleep: Fair  Appetite:   Good  Current Medications: Current Facility-Administered Medications  Medication Dose Route Frequency Provider Last Rate Last Admin  . acetaminophen (TYLENOL) tablet 650 mg  650 mg Oral Q6H PRN Money, Lowry Ram, FNP      . alum & mag hydroxide-simeth (MAALOX/MYLANTA) 200-200-20 MG/5ML suspension 30 mL  30 mL Oral Q4H PRN Money, Lowry Ram, FNP      . escitalopram (LEXAPRO) tablet 10 mg  10 mg Oral Daily Money, Lowry Ram, FNP   10 mg at 12/12/19 0939  . gabapentin (NEURONTIN) capsule 100 mg  100 mg Oral TID Money, Lowry Ram, FNP   100 mg at 12/12/19 1017  . hydrOXYzine (ATARAX/VISTARIL) tablet 25 mg  25 mg Oral TID PRN Money, Lowry Ram, FNP   25 mg at 12/11/19 2126  . magnesium hydroxide (MILK OF MAGNESIA) suspension 30 mL  30 mL Oral Daily PRN Money, Darnelle Maffucci B, FNP      . traZODone (DESYREL) tablet 100 mg  100 mg Oral QHS PRN Sharma Covert, MD      . valACYclovir (VALTREX) tablet 500 mg  500 mg Oral BID Sharma Covert, MD        Lab Results: No results found for this or any previous visit (from the past 48 hour(s)).  Blood Alcohol level:  Lab Results  Component Value Date   ETH <10 12/09/2019   ETH <11 51/06/5850    Metabolic Disorder Labs: Lab Results  Component Value Date   HGBA1C 5.0 12/09/2019   MPG 96.8 12/09/2019   No results found for: PROLACTIN Lab Results  Component Value Date   CHOL 156 12/09/2019   TRIG 85 12/09/2019   HDL 49 12/09/2019   CHOLHDL 3.2 12/09/2019   VLDL 17 12/09/2019   LDLCALC 90 12/09/2019    Physical Findings: AIMS:  , ,  ,  ,    CIWA:    COWS:     Musculoskeletal: Strength & Muscle Tone: within normal limits Gait & Station: normal Patient leans: N/A  Psychiatric Specialty Exam: Physical Exam Vitals and nursing note reviewed.  Constitutional:      Appearance: Normal appearance.  HENT:     Head: Normocephalic and atraumatic.  Pulmonary:     Effort: Pulmonary effort is normal.  Neurological:     General: No focal deficit  present.     Mental Status: She is alert and oriented to person, place, and time.     Review of Systems  Blood pressure 95/68, pulse 72, temperature 97.9 F (36.6 C), temperature source Oral, resp. rate  20, height 5\' 6"  (1.676 m), weight 90.7 kg, SpO2 100 %.Body mass index is 32.28 kg/m.  General Appearance: Casual  Eye Contact:  Fair  Speech:  Normal Rate  Volume:  Normal  Mood:  Euthymic  Affect:  Congruent  Thought Process:  Coherent and Descriptions of Associations: Intact  Orientation:  Full (Time, Place, and Person)  Thought Content:  Logical  Suicidal Thoughts:  No  Homicidal Thoughts:  No  Memory:  Immediate;   Fair Recent;   Fair Remote;   Fair  Judgement:  Intact  Insight:  Fair  Psychomotor Activity:  Normal  Concentration:  Concentration: Fair and Attention Span: Fair  Recall:  [employer] of Knowledge:  Fair  Language:  Good  Akathisia:  Negative  Handed:  Right  AIMS (if indicated):     Assets:  Desire for Improvement Resilience  ADL's:  Intact  Cognition:  WNL  Sleep:  Number of Hours: 4     Treatment Plan Summary: Daily contact with patient to assess and evaluate symptoms and progress in treatment, Medication management and Plan : Patient is seen and examined.  Patient is a 34 year old female with the above-stated past psychiatric history is seen in follow-up.   Diagnosis: 1.  Cocaine dependence versus cocaine use disorder 2.  Cannabis use disorder 3.  Substance-induced mood disorder 4.  Herpes simplex  Pertinent findings on examination today: 1.  Improved mood 2.  Sleep still remains disrupted 3.  Decrease in suicidal ideation 4.  Herpes simplex outbreak  Plan: 1.  Continue Lexapro 10 mg p.o. daily for anxiety and depression 2.  Continue gabapentin 100 mg p.o. 3 times daily for anxiety. 3.  Continue hydroxyzine 25 mg p.o. 3 times daily as needed anxiety. 4.  Increase trazodone 250 mg p.o. nightly for insomnia. 5.  Start Valtrex 500 mg p.o.  twice daily for herpes infection. 6.  Disposition planning-in progress. Sharma Covert, MD 12/12/2019, 12:10 PM
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
## 2019-12-12 NOTE — Progress Notes (Signed)
Pt reports that she is not having SI/HI/AVH.  Pt denies feeling depressed.  She does have some anxiety but  Says she is managing her anxiety well  at this time. Declined vistaril.  RN established rapport with pt and assessed for needs/concerns.  RN administered medications per provider orders.  Pt is calm at this time and coping well.  Pt remains safe on the unit with q 15 min safety checks.

## 2019-12-12 NOTE — Progress Notes (Signed)
   12/11/19 2035  COVID-19 Daily Checkoff  Have you had a fever (temp > 37.80C/100F)  in the past 24 hours?  No  COVID-19 EXPOSURE  Have you traveled outside the state in the past 14 days? No  Have you been in contact with someone with a confirmed diagnosis of COVID-19 or PUI in the past 14 days without wearing appropriate PPE? No  Have you been living in the same home as a person with confirmed diagnosis of COVID-19 or a PUI (household contact)? No  Have you been diagnosed with COVID-19? No

## 2019-12-12 NOTE — Progress Notes (Signed)
Pt reports having a good day. Pt was informed that the MD has increased her trazodone to 150 mg for insomnia and that we will see how effective it is tonight. Per EMR, pt slept 4 hours last night so we will see if that's improved in the AM and will follow-up with pt. Pt denies any other concerns at this time. Has been present in the milieu interacting with others. Medications administered as ordered by MD. Active listening, reassurance, and support provided. Pt denies SI/HI and AVH. Q 15 min safety checks continue. Pt's safety has been maintained.    12/12/19 2115  Psych Admission Type (Psych Patients Only)  Admission Status Voluntary  Psychosocial Assessment  Patient Complaints Insomnia  Eye Contact Fair  Facial Expression Flat  Affect Appropriate to circumstance;Depressed  Speech Logical/coherent  Interaction Assertive  Motor Activity Other (Comment) (WNL)  Appearance/Hygiene Unremarkable  Behavior Characteristics Cooperative;Appropriate to situation;Calm  Mood Depressed;Pleasant  Thought Process  Coherency WDL  Content WDL  Delusions None reported or observed  Perception WDL  Hallucination None reported or observed  Judgment WDL  Confusion None  Danger to Self  Current suicidal ideation? Denies  Self-Injurious Behavior No self-injurious ideation or behavior indicators observed or expressed   Danger to Others  Danger to Others None reported or observed

## 2019-12-13 NOTE — Progress Notes (Signed)
Pt presents with flat affect.  Pt reports  that she was "not depressed, just tired."  Pt denies feeling anxious.  Pt said that she slept well last night and had a hard time getting out of bed (she got out of bed around 1045) this morning.  Pt is taking medications without incident; no adverse reactions were noted.  Pt remains safe on the unit with q 15 min checks. RN will continue to monitor and provide support as needed.

## 2019-12-13 NOTE — [facility] Group Notes (Signed)
Adult Psychoeducational Group Note  Date:  12/13/2019 Time:  2:19 PM  Group Topic/Focus:  Identifying Needs:   The focus of this group is to help patients identify their personal needs that have been historically problematic and identify healthy behaviors to address their needs.  Participation Level:  Active  Participation Quality:  Appropriate  Affect:  Appropriate  Cognitive:  Appropriate  Insight: Improving  Engagement in Group:  Engaged  Modes of Intervention:  Discussion, Education, Exploration and Support  Additional Comments:  Pt rates her energy as an 8/10. States that she is there for depression and substance abuse. Was interactive n the group and was able to identify some personal needs of hers.  Paulino Rily 12/13/2019, 2:19 PM

## 2019-12-13 NOTE — [facility] Group Notes (Signed)
Adult Psychoeducational Group Note  Date:  12/13/2019 Time:  11:09 AM  Group Topic/Focus:  Goals Group:   The focus of this group is to help patients establish daily goals to achieve during treatment and discuss how the patient can incorporate goal setting into their daily lives to aide in recovery.  Participation Level:  Did Not Attend  Jennifer Cortez 12/13/2019, 11:09 AM

## 2019-12-13 NOTE — [facility] Group Notes (Addendum)
LCSW Group Therapy Note  12/13/2019    10:00-11:00am   Type of Therapy and Topic:  Group Therapy: Early Messages Received About Anger  Participation Level:  Did not attend initially, then was present and Active   Description of Group:   In this group, patients shared and discussed the early messages received in their lives about anger through parental or other adult modeling, teaching, repression, punishment, violence, and more.  Participants identified how those childhood lessons influence even now how they usually or often react when angered.  The group discussed that anger is a secondary emotion and what may be the underlying emotional themes that come out through anger outbursts or that are ignored through anger suppression.  Finally, as a group there was a conversation about the workbook's quote that "There is nothing wrong with anger; it is just a sign something needs to change."     Therapeutic Goals: 1. Patients will identify one or more childhood message about anger that they received and how it was taught to them. 2. Patients will discuss how these childhood experiences have influenced and continue to influence their own expression or repression of anger even today. 3. Patients will explore possible primary emotions that tend to fuel their secondary emotion of anger. 4. Patients will learn that anger itself is normal and cannot be eliminated, and that healthier coping skills can assist with resolving conflict rather than worsening situations.  Summary of Patient Progress:  The patient was not present until the last 15 minutes of group.  She stated she should have been there for the whole group because we were discussing anger and she has a "real problem" with anger.   She stated that she is a very nice person most of the time, but when she gets angry it is like an explosion and it is a dark side that nobody should see.  We talked about how when she is being "nice" she is probably stuffing  down her emotions and not being honest about them, so eventually they just erupt.  It was pointed out that this often happens in cases of trauma, and she indicated that was true for her.  She asked what a person can do in certain situations, received feedback but was mostly encouraged to approach the work on her anger with an outpatient therapist.   Therapeutic Modalities:   Cognitive Behavioral Therapy Motivation Interviewing  Maretta Los  .

## 2019-12-13 NOTE — Progress Notes (Signed)
[facility] Group Notes:  (Nursing/MHT/Case Management/Adjunct)  Date:  12/13/2019  Time:  2030  Type of Therapy:  wrap up group  Participation Level:  Active  Participation Quality:  Appropriate, Attentive, Sharing and Supportive  Affect:  Appropriate  Cognitive:  Alert  Insight:  Improving  Engagement in Group:  Engaged  Modes of Intervention:  Clarification, Education and Support  Summary of Progress/Problems: Positive thinking and positive change were discussed.   Shellia Cleverly 12/13/2019, 10:24 PM

## 2019-12-13 NOTE — Progress Notes (Signed)
   12/13/19 2303  Psych Admission Type (Psych Patients Only)  Admission Status Voluntary  Psychosocial Assessment  Patient Complaints Insomnia  Eye Contact Fair  Facial Expression Flat  Affect Appropriate to circumstance  Speech Logical/coherent  Interaction Assertive  Motor Activity Other (Comment) (WDL)  Appearance/Hygiene Unremarkable  Behavior Characteristics Appropriate to situation  Mood Pleasant  Thought Process  Coherency WDL  Content WDL  Delusions None reported or observed  Perception WDL  Hallucination None reported or observed  Judgment WDL  Confusion None  Danger to Self  Current suicidal ideation? Denies  Self-Injurious Behavior No self-injurious ideation or behavior indicators observed or expressed   Agreement Not to Harm Self Yes  Description of Agreement verbal  Danger to Others  Danger to Others None reported or observed

## 2019-12-13 NOTE — Progress Notes (Signed)
Jennifer Cortez Progress Note  12/13/2019 3:24 PM Jennifer Cortez  MRN:  [id number]  Subjective: Jennifer Cortez reports, "I'm doing good. I'm just feeling sleepy this morning".   Patient is a 34 year old female with a past psychiatric history significant for cocaine use disorder, marijuana use disorder and suicidal ideation.  She was admitted on 12/11/2019.  Objective: Patient is seen and examined.  Patient is a 34 year old female with the above-stated past psychiatric history is seen in follow-up.  She states she is doing well.  She states she is not having suicidal ideation.  She states she is feeling sleepy, that is the reason she is still in bed. Denies any substance withdrawal symptoms. She was started on Valterx yesterday for Herpes outbreak. Her vital signs are stable, she is afebrile.  Review of her laboratories showed no new laboratories. She denies any SIHI, AVH, delusional thoughts or paranoia. She does not appear to be responding to any internal stimuli.    Principal Problem: MDD (major depressive disorder), severe ([place])  Diagnosis: Principal Problem:   MDD (major depressive disorder), severe ([place]) Active Problems:   Substance induced mood disorder ([place])  Total Time spent with patient: 15 minutes  Past Psychiatric History: See admission H&P  Past Medical History:  Past Medical History:  Diagnosis Date  . Anxiety   . Bipolar 1 disorder ([place])   . Depression   . Dermoid cyst of right ovary     5 cm dermoid in right ovary -> plan laparoscopic removal after delivery.  [place] stones   . HSV (herpes simplex virus) infection     Past Surgical History:  Procedure Laterality Date  . CESAREAN SECTION N/A 02/20/2017   Procedure: CESAREAN SECTION;  Surgeon: Truett Mainland, DO;  Location: [facility];  Service: Obstetrics;  Laterality: N/A;  . DILATION AND CURETTAGE OF UTERUS    . gall stones    . MULTIPLE TOOTH EXTRACTIONS    . TUBAL LIGATION     Family History:  Family History   Problem Relation Age of Onset  . Diabetes Maternal Grandmother   . Hepatitis B Maternal Grandmother    Family Psychiatric  History: See admission H&P  Social History:  Social History   Substance and Sexual Activity  Alcohol Use Yes   Comment: occ     Social History   Substance and Sexual Activity  Drug Use Yes  . Frequency: 7.0 times per week  . Types: Marijuana   Comment: quit mj    Social History   Socioeconomic History  . Marital status: Single    Spouse name: Not on file  . Number of children: Not on file  . Years of education: Not on file  . Highest education level: Not on file  Occupational History  . Not on file  Tobacco Use  . Smoking status: Current Every Day Smoker    Packs/day: 0.25    Types: Cigarettes  . Smokeless tobacco: Never Used  Substance and Sexual Activity  . Alcohol use: Yes    Comment: occ  . Drug use: Yes    Frequency: 7.0 times per week    Types: Marijuana    Comment: quit mj  . Sexual activity: Not Currently    Birth control/protection: None  Other Topics Concern  . Not on file  Social History Narrative  . Not on file   Social Determinants of Health   Financial Resource Strain:   . Difficulty of Paying Living Expenses:   Food Insecurity:   .  Worried About Charity fundraiser in the Last Year:   . Arboriculturist in the Last Year:   Transportation Needs:   . Film/video editor (Medical):   Marland Kitchen Lack of Transportation (Non-Medical):   Physical Activity:   . Days of Exercise per Week:   . Minutes of Exercise per Session:   Stress:   . Feeling of Stress :   Social Connections:   . Frequency of Communication with Friends and Family:   . Frequency of Social Gatherings with Friends and Family:   . Attends Religious Services:   . Active Member of Clubs or Organizations:   . Attends Archivist Meetings:   Marland Kitchen Marital Status:    Additional Social History:   Sleep: Fair  Appetite:  Good  Current  Medications: Current Facility-Administered Medications  Medication Dose Route Frequency Provider Last Rate Last Admin  . acetaminophen (TYLENOL) tablet 650 mg  650 mg Oral Q6H PRN Money, Lowry Ram, FNP      . alum & mag hydroxide-simeth (MAALOX/MYLANTA) 200-200-20 MG/5ML suspension 30 mL  30 mL Oral Q4H PRN Money, Darnelle Maffucci B, FNP      . escitalopram (LEXAPRO) tablet 10 mg  10 mg Oral Daily Money, Darnelle Maffucci B, FNP   10 mg at 12/13/19 1049  . gabapentin (NEURONTIN) capsule 100 mg  100 mg Oral TID Money, Lowry Ram, FNP   100 mg at 12/13/19 1432  . hydrOXYzine (ATARAX/VISTARIL) tablet 25 mg  25 mg Oral TID PRN Money, Lowry Ram, FNP   25 mg at 12/11/19 2126  . magnesium hydroxide (MILK OF MAGNESIA) suspension 30 mL  30 mL Oral Daily PRN Money, Darnelle Maffucci B, FNP   30 mL at 12/12/19 1232  . traZODone (DESYREL) tablet 150 mg  150 mg Oral QHS Sharma Covert, Cortez   150 mg at 12/12/19 2118  . valACYclovir (VALTREX) tablet 500 mg  500 mg Oral BID Sharma Covert, Cortez   500 mg at 12/13/19 1049   Lab Results: No results found for this or any previous visit (from the past 48 hour(s)).  Blood Alcohol level:  Lab Results  Component Value Date   ETH <10 12/09/2019   ETH <11 71/09/2692   Metabolic Disorder Labs: Lab Results  Component Value Date   HGBA1C 5.0 12/09/2019   MPG 96.8 12/09/2019   No results found for: PROLACTIN Lab Results  Component Value Date   CHOL 156 12/09/2019   TRIG 85 12/09/2019   HDL 49 12/09/2019   CHOLHDL 3.2 12/09/2019   VLDL 17 12/09/2019   LDLCALC 90 12/09/2019   Physical Findings: AIMS:  , ,  ,  ,    CIWA:    COWS:     Musculoskeletal: Strength & Muscle Tone: within normal limits Gait & Station: normal Patient leans: N/A  Psychiatric Specialty Exam: Physical Exam Vitals and nursing note reviewed.  Constitutional:      Appearance: Normal appearance.  HENT:     Head: Normocephalic and atraumatic.     Nose: Nose normal.     Mouth/Throat:     Pharynx: Oropharynx  is clear.  Eyes:     Pupils: Pupils are equal, round, and reactive to light.  Pulmonary:     Effort: Pulmonary effort is normal.  Genitourinary:    Comments: Deferred Musculoskeletal:        General: Normal range of motion.  Skin:    General: Skin is warm and dry.  Neurological:  General: No focal deficit present.     Mental Status: She is alert and oriented to person, place, and time.     Review of Systems  Constitutional: Negative for chills, diaphoresis and fever.  HENT: Negative for congestion, rhinorrhea, sneezing and sore throat.   Eyes: Negative for discharge.  Respiratory: Negative for cough, chest tightness, shortness of breath and wheezing.   Cardiovascular: Negative for chest pain and palpitations.  Gastrointestinal: Negative for diarrhea, nausea and vomiting.  Endocrine: Negative for cold intolerance.  Genitourinary: Negative for difficulty urinating.  Musculoskeletal: Negative for arthralgias and myalgias.  Skin: Negative for color change.  Allergic/Immunologic: Negative for environmental allergies and food allergies.       Allergies: NKDA  Neurological: Negative for dizziness, tremors, seizures, syncope, light-headedness and headaches.  Psychiatric/Behavioral: Positive for dysphoric mood ("Improving"). Negative for agitation, behavioral problems, confusion, decreased concentration, hallucinations, self-injury, sleep disturbance and suicidal ideas. The patient is not nervous/anxious and is not hyperactive.     Blood pressure 102/71, pulse 72, temperature 97.8 F (36.6 C), temperature source Oral, resp. rate 20, height 5\' 6"  (1.676 m), weight 90.7 kg, SpO2 100 %.Body mass index is 32.28 kg/m.  General Appearance: Casual  Eye Contact:  Fair  Speech:  Normal Rate  Volume:  Normal  Mood:  Euthymic  Affect:  Congruent  Thought Process:  Coherent and Descriptions of Associations: Intact  Orientation:  Full (Time, Place, and Person)  Thought Content:  Logical   Suicidal Thoughts:  No  Homicidal Thoughts:  No  Memory:  Immediate;   Fair Recent;   Fair Remote;   Fair  Judgement:  Intact  Insight:  Fair  Psychomotor Activity:  Normal  Concentration:  Concentration: Fair and Attention Span: Fair  Recall:  [employer] of Knowledge:  Fair  Language:  Good  Akathisia:  Negative  Handed:  Right  AIMS (if indicated):     Assets:  Desire for Improvement Resilience  ADL's:  Intact  Cognition:  WNL  Sleep:  Number of Hours: 4   Treatment Plan Summary: Daily contact with patient to assess and evaluate symptoms and progress in treatment, Medication management and Plan : Patient is seen and examined.  Patient is a 34 year old female with the above-stated past psychiatric history is seen in follow-up.  Diagnosis: 1.  Cocaine dependence versus cocaine use disorder 2.  Cannabis use disorder 3.  Substance-induced mood disorder 4.  Herpes simplex  Pertinent findings on examination today: 1.  Improved mood 2.  Sleep still remains disrupted, sleeps more during the day. 3.  Denies in suicidal ideation 4.  Herpes simplex outbreak  Plan: 1.  Continue Lexapro 10 mg p.o. daily for anxiety and depression 2.  Continue gabapentin 100 mg p.o. 3 times daily for anxiety. 3.  Continue hydroxyzine 25 mg p.o. 3 times daily as needed anxiety. 4.  Continue trazodone 250 mg p.o. nightly for insomnia. 5.  Continue Valtrex 500 mg p.o. twice daily for herpes infection. 6.  Disposition planning-in progress. 7.   Encourage group participation.  Lindell Spar, NP, PMHNP, FNP-BC 12/13/2019, 3:24 PMPatient ID: Larae Grooms, female   DOB: 1986/02/10, 34 y.o.   MRN: [id number]

## 2019-12-13 NOTE — Progress Notes (Signed)
°   12/13/19 2301  COVID-19 Daily Checkoff  Have you had a fever (temp > 37.80C/100F)  in the past 24 hours?  No  If you have had runny nose, nasal congestion, sneezing in the past 24 hours, has it worsened? No  COVID-19 EXPOSURE  Have you traveled outside the state in the past 14 days? No  Have you been in contact with someone with a confirmed diagnosis of COVID-19 or PUI in the past 14 days without wearing appropriate PPE? No  Have you been living in the same home as a person with confirmed diagnosis of COVID-19 or a PUI (household contact)? No  Have you been diagnosed with COVID-19? No

## 2019-12-14 MED ORDER — WHITE PETROLATUM EX OINT
TOPICAL_OINTMENT | CUTANEOUS | Status: AC
  Filled 2019-12-14: qty 5

## 2019-12-14 NOTE — Progress Notes (Signed)
[facility] MD Progress Note  12/14/2019 1:38 PM Jennifer Cortez  MRN:  [id number]  Subjective: [name] reports, "I'm doing well".  Patient is a 34 year old female with a past psychiatric history significant for cocaine use disorder, marijuana use disorder and suicidal ideation.  She was admitted on 12/11/2019.  Objective: Patient is seen and examined.  Patient is a 34 year old female with the above-stated past psychiatric history is seen in follow-up.  She states today that she is doing well. She presents with an improved mood & a bright affect, good eye contact. She states she is not having suicidal/homicidal ideations.  She is visible on this unit, attending group sessions today. Denies any substance withdrawal symptoms. She was started on Valterx 2 days ago for Herpes outbreak. Her vital signs are stable, she is afebrile.  Review of her laboratories showed no new laboratories. She denies any SIHI, AVH, delusional thoughts or paranoia. She does not appear to be responding to any internal stimuli. Patient at this time is mentally & medically stable for discharge. 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   Principal Problem: MDD (major depressive disorder), severe ([place])  Diagnosis: Principal Problem:   MDD (major depressive disorder), severe ([place]) Active Problems:   Substance induced mood disorder ([place])  Total Time spent with patient: 15 minutes  Past Psychiatric History: See admission H&P  Past Medical History:  Past Medical History:  Diagnosis Date   Anxiety    Bipolar 1 disorder ([place])    Depression    Dermoid cyst of right ovary     5 cm dermoid in right ovary -> plan laparoscopic removal after delivery.   Gall stones    HSV (herpes simplex virus) infection     Past Surgical History:  Procedure  Laterality Date   CESAREAN SECTION N/A 02/20/2017   Procedure: CESAREAN SECTION;  Surgeon: Truett Mainland, DO;  Location: [place];  Service: Obstetrics;  Laterality: N/A;   DILATION AND CURETTAGE OF UTERUS     gall stones     MULTIPLE TOOTH EXTRACTIONS     TUBAL LIGATION     Family History:  Family History  Problem Relation Age of Onset   Diabetes Maternal Grandmother    Hepatitis B Maternal Grandmother    Family Psychiatric  History: See admission H&P  Social History:  Social History   Substance and Sexual Activity  Alcohol Use Yes   Comment: occ     Social History   Substance and Sexual Activity  Drug Use Yes   Frequency: 7.0 times per week   Types: Marijuana   Comment: quit mj    Social History   Socioeconomic History   Marital status: Single    Spouse name: Not on file   Number of children: Not on file   Years of education: Not on file   Highest education level: Not on file  Occupational History   Not on file  Tobacco Use   Smoking status: Current Every Day Smoker    Packs/day: 0.25    Types: Cigarettes   Smokeless tobacco: Never Used  Substance and Sexual Activity   Alcohol use: Yes    Comment: occ   Drug use: Yes    Frequency: 7.0 times per week    Types: Marijuana    Comment: quit mj   Sexual activity: Not Currently    Birth  control/protection: None  Other Topics Concern   Not on file  Social History Narrative   Not on file   Social Determinants of Health   Financial Resource Strain:    Difficulty of Paying Living Expenses:   Food Insecurity:    Worried About Charity fundraiser in the Last Year:    Arboriculturist in the Last Year:   Transportation Needs:    Film/video editor (Medical):    Lack of Transportation (Non-Medical):   Physical Activity:    Days of Exercise per Week:    Minutes of Exercise per Session:   Stress:    Feeling of Stress :   Social Connections:    Frequency of  Communication with Friends and Family:    Frequency of Social Gatherings with Friends and Family:    Attends Religious Services:    Active Member of Clubs or Organizations:    Attends Archivist Meetings:    Marital Status:    Additional Social History:   Sleep: Fair  Appetite:  Good  Current Medications: Current Facility-Administered Medications  Medication Dose Route Frequency Provider Last Rate Last Admin   acetaminophen (TYLENOL) tablet 650 mg  650 mg Oral Q6H PRN Money, Lowry Ram, FNP       alum & mag hydroxide-simeth (MAALOX/MYLANTA) 200-200-20 MG/5ML suspension 30 mL  30 mL Oral Q4H PRN Money, Lowry Ram, FNP       escitalopram (LEXAPRO) tablet 10 mg  10 mg Oral Daily Money, Lowry Ram, FNP   10 mg at 12/14/19 0755   gabapentin (NEURONTIN) capsule 100 mg  100 mg Oral TID Money, Darnelle Maffucci B, FNP   100 mg at 12/14/19 1152   hydrOXYzine (ATARAX/VISTARIL) tablet 25 mg  25 mg Oral TID PRN Money, Lowry Ram, FNP   25 mg at 12/13/19 2123   magnesium hydroxide (MILK OF MAGNESIA) suspension 30 mL  30 mL Oral Daily PRN Money, Darnelle Maffucci B, FNP   30 mL at 12/12/19 1232   traZODone (DESYREL) tablet 150 mg  150 mg Oral QHS Sharma Covert, MD   150 mg at 12/13/19 2123   valACYclovir (VALTREX) tablet 500 mg  500 mg Oral BID Sharma Covert, MD   500 mg at 12/14/19 0757   Lab Results: No results found for this or any previous visit (from the past 48 hour(s)).  Blood Alcohol level:  Lab Results  Component Value Date   ETH <10 12/09/2019   ETH <11 38/18/2993   Metabolic Disorder Labs: Lab Results  Component Value Date   HGBA1C 5.0 12/09/2019   MPG 96.8 12/09/2019   No results found for: PROLACTIN Lab Results  Component Value Date   CHOL 156 12/09/2019   TRIG 85 12/09/2019   HDL 49 12/09/2019   CHOLHDL 3.2 12/09/2019   VLDL 17 12/09/2019   LDLCALC 90 12/09/2019   Physical Findings: AIMS: Facial and Oral Movements Muscles of Facial Expression: None, normal Lips  and Perioral Area: None, normal Jaw: None, normal Tongue: None, normal,Extremity Movements Upper (arms, wrists, hands, fingers): None, normal Lower (legs, knees, ankles, toes): None, normal, Trunk Movements Neck, shoulders, hips: None, normal, Overall Severity Severity of abnormal movements (highest score from questions above): None, normal Incapacitation due to abnormal movements: None, normal Patient's awareness of abnormal movements (rate only patient's report): No Awareness, Dental Status Current problems with teeth and/or dentures?: No Does patient usually wear dentures?: No  CIWA:    COWS:     Musculoskeletal: Strength &  Muscle Tone: within normal limits Gait & Station: normal Patient leans: N/A  Psychiatric Specialty Exam: Physical Exam Vitals and nursing note reviewed.  Constitutional:      Appearance: Normal appearance.  HENT:     Head: Normocephalic and atraumatic.     Nose: Nose normal.     Mouth/Throat:     Pharynx: Oropharynx is clear.  Eyes:     Pupils: Pupils are equal, round, and reactive to light.  Pulmonary:     Effort: Pulmonary effort is normal.  Genitourinary:    Comments: Deferred Musculoskeletal:        General: Normal range of motion.  Skin:    General: Skin is warm and dry.  Neurological:     General: No focal deficit present.     Mental Status: She is alert and oriented to person, place, and time.     Review of Systems  Constitutional: Negative for chills, diaphoresis and fever.  HENT: Negative for congestion, rhinorrhea, sneezing and sore throat.   Eyes: Negative for discharge.  Respiratory: Negative for cough, chest tightness, shortness of breath and wheezing.   Cardiovascular: Negative for chest pain and palpitations.  Gastrointestinal: Negative for diarrhea, nausea and vomiting.  Endocrine: Negative for cold intolerance.  Genitourinary: Negative for difficulty urinating.  Musculoskeletal: Negative for arthralgias and myalgias.  Skin:  Negative for color change.  Allergic/Immunologic: Negative for environmental allergies and food allergies.       Allergies: NKDA  Neurological: Negative for dizziness, tremors, seizures, syncope, light-headedness and headaches.  Psychiatric/Behavioral: Positive for dysphoric mood ("Improving"). Negative for agitation, behavioral problems, confusion, decreased concentration, hallucinations, self-injury, sleep disturbance and suicidal ideas. The patient is not nervous/anxious and is not hyperactive.     Blood pressure 102/71, pulse 72, temperature 97.8 F (36.6 C), temperature source Oral, resp. rate 20, height 5\' 6"  (1.676 m), weight 90.7 kg, SpO2 100 %.Body mass index is 32.28 kg/m.  General Appearance: Casual  Eye Contact:  Fair  Speech:  Normal Rate  Volume:  Normal  Mood:  Euthymic  Affect:  Congruent  Thought Process:  Coherent and Descriptions of Associations: Intact  Orientation:  Full (Time, Place, and Person)  Thought Content:  Logical  Suicidal Thoughts:  No  Homicidal Thoughts:  No  Memory:  Immediate;   Fair Recent;   Fair Remote;   Fair  Judgement:  Intact  Insight:  Fair  Psychomotor Activity:  Normal  Concentration:  Concentration: Fair and Attention Span: Fair  Recall:  [employer] of Knowledge:  Fair  Language:  Good  Akathisia:  Negative  Handed:  Right  AIMS (if indicated):     Assets:  Desire for Improvement Resilience  ADL's:  Intact  Cognition:  WNL  Sleep:  Number of Hours: 6.25   Treatment Plan Summary: Daily contact with patient to assess and evaluate symptoms and progress in treatment, Medication management and Plan : Patient is seen and examined.  Patient is a 34 year old female with the above-stated past psychiatric history is seen in follow-up.  Diagnosis: 1.  Cocaine dependence versus cocaine use disorder 2.  Cannabis use disorder 3.  Substance-induced mood disorder 4.  Herpes simplex  Pertinent findings on examination today: 1.  Improved  mood 2.  Sleep still remains disrupted, sleeps more during the day. 3.  Denies in suicidal ideation 4.  Herpes simplex outbreak  Plan: 1.  Continue Lexapro 10 mg p.o. daily for anxiety and depression 2.  Continue gabapentin 100 mg p.o. 3 times daily  for anxiety. 3.  Continue hydroxyzine 25 mg p.o. 3 times daily as needed anxiety. 4.  Continue trazodone 250 mg p.o. nightly for insomnia. 5.  Continue Valtrex 500 mg p.o. twice daily for herpes infection. 6.  Disposition planning-in progress. 7.  Encourage group participation.  Lindell Spar, NP, PMHNP, FNP-BC 12/14/2019, 1:38 PMPatient ID: Larae Grooms, female   DOB: 07-Jul-1985, 34 y.o.   MRN: [id number] Patient ID: SHALIKA ARNTZ, female   DOB: [birth year], 34 y.o.   MRN: [id number]

## 2019-12-14 NOTE — Progress Notes (Signed)
°   12/14/19 2217  COVID-19 Daily Checkoff  Have you had a fever (temp > 37.80C/100F)  in the past 24 hours?  No  If you have had runny nose, nasal congestion, sneezing in the past 24 hours, has it worsened? No  COVID-19 EXPOSURE  Have you traveled outside the state in the past 14 days? No  Have you been in contact with someone with a confirmed diagnosis of COVID-19 or PUI in the past 14 days without wearing appropriate PPE? No  Have you been living in the same home as a person with confirmed diagnosis of COVID-19 or a PUI (household contact)? No  Have you been diagnosed with COVID-19? No

## 2019-12-14 NOTE — [facility] Group Notes (Signed)
Date:  12/14/2019 Time:  9:00am-9:45am  Group Topic/Focus: PROGRESSIVE RELAXATION. A group where deep breathing is taught and tensing and relaxation muscle groups is used. Imagery is used as well.  Pts are asked to imagine 3 pillars that hold them up when they are not able to hold themselves up.  Participation Level:  Active  Participation Quality:  Appropriate  Affect:  Appropriate  Cognitive:  Oriented  Insight: Improving  Engagement in Group:  Engaged  Modes of Intervention:  Activity, Discussion, Education, and Support  Additional Comments:  Rates her energy as a 7/10. States wheat holds her up when she cannot hold herself up is her mother, her kids and the father of her children. Also states that she has learned that kindness is not a weakness  Paulino Rily 12/14/2019

## 2019-12-14 NOTE — Plan of Care (Signed)
°  Problem: Education: Goal: Knowledge of [facility] Education information/materials will improve Outcome: Adequate for Discharge   Problem: Education: Goal: Emotional status will improve Outcome: Adequate for Discharge   Problem: Education: Goal: Mental status will improve Outcome: Adequate for Discharge   Problem: Education: Goal: Verbalization of understanding the information provided will improve Outcome: Adequate for Discharge   D: Patient rates her sleep as fair; her appetite and concentration are good; her energy level is normal per self inventory. Patient is pleasant with staff; she is attending group activities today. She is a possible discharge for tomorrow.  She denies any thoughts of self harm.  She denies any depressive symptoms or anxiety.  Her goal is to "be on track with my meds/coping skills.  A: Continue to monitor medication management and MD orders.  Safety checks completed every 15 minutes per protocol.  Offer support and encouragement as needed.  R: Patient is receptive to staff; her behavior is appropriate.

## 2019-12-14 NOTE — Progress Notes (Signed)
   12/14/19 2219  Psych Admission Type (Psych Patients Only)  Admission Status Voluntary  Psychosocial Assessment  Patient Complaints None  Eye Contact Fair  Facial Expression Flat  Affect Appropriate to circumstance  Speech Logical/coherent  Interaction Assertive  Motor Activity Other (Comment) (WDL)  Appearance/Hygiene Unremarkable  Behavior Characteristics Appropriate to situation  Mood Pleasant  Thought Process  Coherency WDL  Content WDL  Delusions None reported or observed  Perception WDL  Hallucination None reported or observed  Judgment WDL  Confusion None  Danger to Self  Current suicidal ideation? Denies  Self-Injurious Behavior No self-injurious ideation or behavior indicators observed or expressed   Agreement Not to Harm Self Yes  Description of Agreement verbal  Danger to Others  Danger to Others None reported or observed

## 2019-12-14 NOTE — Progress Notes (Signed)
   12/14/19 0946  COVID-19 Daily Checkoff  Have you had a fever (temp > 37.80C/100F)  in the past 24 hours?  No  If you have had runny nose, nasal congestion, sneezing in the past 24 hours, has it worsened? No  COVID-19 EXPOSURE  Have you traveled outside the state in the past 14 days? No  Have you been in contact with someone with a confirmed diagnosis of COVID-19 or PUI in the past 14 days without wearing appropriate PPE? No  Have you been living in the same home as a person with confirmed diagnosis of COVID-19 or a PUI (household contact)? No  Have you been diagnosed with COVID-19? No

## 2019-12-14 NOTE — Progress Notes (Signed)
   12/14/19 0900  Psych Admission Type (Psych Patients Only)  Admission Status Voluntary  Psychosocial Assessment  Patient Complaints None  Eye Contact Fair  Facial Expression Animated  Affect Appropriate to circumstance  Speech Logical/coherent  Interaction Assertive  Motor Activity Other (Comment) (wnl)  Appearance/Hygiene Unremarkable  Behavior Characteristics Appropriate to situation  Mood Pleasant  Thought Process  Coherency WDL  Content WDL  Delusions None reported or observed  Perception WDL  Hallucination None reported or observed  Judgment WDL  Confusion None  Danger to Self  Current suicidal ideation? Denies  Self-Injurious Behavior No self-injurious ideation or behavior indicators observed or expressed   Agreement Not to Harm Self Yes  Description of Agreement verbal  Danger to Others  Danger to Others None reported or observed

## 2019-12-14 NOTE — BHH Suicide Risk Assessment (Signed)
[facility] INPATIENT:  Family/Significant Other Suicide Prevention Education  Suicide Prevention Education:  Contact Attempts: Emergency Contact Curlene Labrum [phone]), (name of family member/significant other) has been identified by the patient as the family member/significant other with whom the patient will be residing, and identified as the person(s) who will aid the patient in the event of a mental health crisis.  With written consent from the patient, two attempts were made to provide suicide prevention education, prior to and/or following the patient's discharge.  We were unsuccessful in providing suicide prevention education.  A suicide education pamphlet was given to the patient to share with family/significant other.  Date and time of first attempt:  12/14/2019    9:44 AM Could not leave voicemail, as that has not been set up. Date and time of second attempt:  Maretta Los 12/14/2019, 9:43 AM

## 2019-12-14 NOTE — BHH Group Notes (Signed)
[facility] LCSW Group Therapy Note  12/14/2019    Type of Therapy and Topic:  Group Therapy:  Adding Supports Including Yourself  Participation Level:  Active   Description of Group:   Patients in this group were introduced to the concept that additional supports including self-support are an essential part of recovery.  Patients listed what supports they believe they need to add to their lives to achieve their goals at discharge, and they listed such things as therapist, family, doctor, support groups, 12-step groups and service animals.   A song entitled "My Own Hero" was played and a group discussion ensued in which patients stated they could relate to the song and it inspired them to realize they have be willing to help themselves in order to succeed, because other people cannot achieve sobriety or stability for them.  "Fight For It" was played, then "I Am Enough" to encourage patients.  They discussed the impact on them and how they must remain convinced that their lives are worth the effort it takes to become sober and/or stable.  Therapeutic Goals: 1)  demonstrate the importance of being a key part of one's own support system 2)  discuss various available supports 3)  encourage patient to use music as part of their self-support and focus on goals 4)  elicit ideas from patients about supports that need to be added   Summary of Patient Progress:  The patient expressed that her mother, children, and children's father are healthy supports for her, while her "friend" and ex-boyfriend are unhealthy supports.  She was taken out of group for awhile to see a provider, but did return and heard some of the music, was appreciative of it and shared positive comments.   Therapeutic Modalities:   Motivational Interviewing Activity  Berlin Hun Grossman-Orr  8:23 AM

## 2019-12-15 MED ORDER — ESCITALOPRAM OXALATE 10 MG PO TABS: 10.0000 mg | ORAL_TABLET | Freq: Every day | ORAL | 0 refills | Status: DC

## 2019-12-15 MED ORDER — VALACYCLOVIR HCL 500 MG PO TABS: 500.0000 mg | ORAL_TABLET | Freq: Two times a day (BID) | ORAL | 0 refills | Status: DC

## 2019-12-15 MED ORDER — GABAPENTIN 100 MG PO CAPS: 100.0000 mg | ORAL_CAPSULE | Freq: Three times a day (TID) | ORAL | 0 refills | Status: DC

## 2019-12-15 NOTE — Progress Notes (Signed)
D:  Patient's self inventory sheet, patient has fair sleep, sleep medication not helpful.  Rated depression and anxiety 2, denied hopeless.  Denied withdrawals.  Denied SI.  Denied physical problems.  Denied physical pain.  Goal is discharge, not allowing herself to get upset.  Plans to do breathing exercises.  Wants to discharge. A:  Medications administered per MD orders.  Emotional support and encouragement given patient. R:  Denied SI and HI, contracts for safety.  Denied A/V hallucinations.  Safety maintained with 15 minute checks.

## 2019-12-15 NOTE — BHH Suicide Risk Assessment (Signed)
[facility] INPATIENT:  Family/Significant Other Suicide Prevention Education  Suicide Prevention Education:  Education Completed; Curlene Labrum [phone]) - ,  (name of family member/significant other) has been identified by the patient as the family member/significant other with whom the patient will be residing, and identified as the person(s) who will aid the patient in the event of a mental health crisis (suicidal ideations/suicide attempt).  With written consent from the patient, the family member/significant other has been provided the following suicide prevention education, prior to the and/or following the discharge of the patient.  The suicide prevention education provided includes the following:  Suicide risk factors  Suicide prevention and interventions  National Suicide Hotline telephone number  [facility] assessment telephone number  [facility] Emergency Assistance [place] and/or Residential Mobile Crisis Unit telephone number  Request made of family/significant other to:  Remove weapons (e.g., guns, rifles, knives), all items previously/currently identified as safety concern.    Remove drugs/medications (over-the-counter, prescriptions, illicit drugs), all items previously/currently identified as a safety concern.  The family member/significant other verbalizes understanding of the suicide prevention education information provided.  The family member/significant other agrees to remove the items of safety concern listed above.  Curlene Labrum confirmed he could pick pt up at [facility] 12/15/2019, 9:26 AM

## 2019-12-15 NOTE — BHH Suicide Risk Assessment (Signed)
[facility] Discharge Suicide Risk Assessment   Principal Problem: MDD (major depressive disorder), severe ([place]) Discharge Diagnoses: Principal Problem:   MDD (major depressive disorder), severe ([place]) Active Problems:   Substance induced mood disorder ([place])   Total Time spent with patient: 15 minutes  Musculoskeletal: Strength & Muscle Tone: within normal limits Gait & Station: normal Patient leans: N/A  Psychiatric Specialty Exam: Review of Systems  All other systems reviewed and are negative.   Blood pressure (!) 106/54, pulse 83, temperature 97.9 F (36.6 C), temperature source Oral, resp. rate 20, height 5\' 6"  (1.676 m), weight 90.7 kg, SpO2 100 %.Body mass index is 32.28 kg/m.  General Appearance: Casual  Eye Contact::  Good  Speech:  Normal Rate409  Volume:  Normal  Mood:  Euthymic  Affect:  Congruent  Thought Process:  Coherent and Descriptions of Associations: Intact  Orientation:  Full (Time, Place, and Person)  Thought Content:  Logical  Suicidal Thoughts:  No  Homicidal Thoughts:  No  Memory:  Immediate;   Fair Recent;   Fair Remote;   Fair  Judgement:  Intact  Insight:  Fair  Psychomotor Activity:  Normal  Concentration:  Good  Recall:  [place] of Knowledge:Good  Language: Good  Akathisia:  Negative  Handed:  Right  AIMS (if indicated):     Assets:  Desire for Improvement Resilience  Sleep:  Number of Hours: 6.75  Cognition: WNL  ADL's:  Intact   Mental Status Per Nursing Assessment::   On Admission:  Self-harm thoughts  Demographic Factors:  Divorced or widowed and Unemployed  Loss Factors: NA  Historical Factors: Impulsivity  Risk Reduction Factors:   Living with another person, especially a relative  Continued Clinical Symptoms:  Depression:   Comorbid alcohol abuse/dependence Impulsivity Alcohol/Substance Abuse/Dependencies  Cognitive Features That Contribute To Risk:  None    Suicide Risk:  Minimal: No identifiable suicidal  ideation.  Patients presenting with no risk factors but with morbid ruminations; may be classified as minimal risk based on the severity of the depressive symptoms    Plan Of Care/Follow-up recommendations:  Activity:  ad lib  Sharma Covert, MD 12/15/2019, 7:52 AM

## 2019-12-15 NOTE — Progress Notes (Addendum)
Discharge:  Patient discharged home.  Suicide prevention  information given and discussed with patient who stated she understood and had no questions.  Patient stated she appreciated all assistance received from [facility] staff.  Patient stated she received all her belongings, clothing, toiletries, misc items, etc.  All required discharge information given to patient at discharge.

## 2019-12-15 NOTE — Progress Notes (Signed)
Pt attend wrap up groupAdult Psychoeducational Group Note  Date:  12/15/2019 Time:  8:10 AM  Group Topic/Focus:  Wrap-Up Group:   The focus of this group is to help patients review their daily goal of treatment and discuss progress on daily workbooks.  Participation Level:  Active  Participation Quality:  Appropriate   Affect:  Appropriate  Cognitive:  Appropriate  Insight: Appropriate  Engagement in Group:  Engaged  Modes of Intervention:  Discussion  Additional Comments:    Barbette Hair 12/15/2019, 8:10 AM

## 2019-12-15 NOTE — Discharge Summary (Signed)
Physician Discharge Summary Note  Patient:  Jennifer Cortez is an 34 y.o., female MRN:  [id number] DOB:  07/24/1985 Patient phone:  [phone] (home)  Patient address:   [address] [place] [address],  Total Time spent with patient: 30 minutes  Date of Admission:  12/10/2019 Date of Discharge: 12/15/2019  Reason for Admission: Patient is a 34 year old female with a reported past psychiatric history significant for cocaine use disorder as well as marijuana use disorder who presented to the behavioral health urgent care center on 12/09/2019. She was seeking assistance with depression and cocaine use. She stated that she recently broke up with a significant other because he was manipulative. She was admitted to unit for stabilization and further evaluation.  Principal Problem: MDD (major depressive disorder), severe ([place]) Discharge Diagnoses: Principal Problem:   MDD (major depressive disorder), severe ([place]) Active Problems:   Substance induced mood disorder ([place])   Past Psychiatric History: Major depressive disorder, Anxiety disorder.  Past Medical History:  Past Medical History:  Diagnosis Date  . Anxiety   . Bipolar 1 disorder ([place])   . Depression   . Dermoid cyst of right ovary     5 cm dermoid in right ovary -> plan laparoscopic removal after delivery.  [place] stones   . HSV (herpes simplex virus) infection     Past Surgical History:  Procedure Laterality Date  . CESAREAN SECTION N/A 02/20/2017   Procedure: CESAREAN SECTION;  Surgeon: Truett Mainland, DO;  Location: [facility];  Service: Obstetrics;  Laterality: N/A;  . DILATION AND CURETTAGE OF UTERUS    . gall stones    . MULTIPLE TOOTH EXTRACTIONS    . TUBAL LIGATION     Family History:  Family History  Problem Relation Age of Onset  . Diabetes Maternal Grandmother   . Hepatitis B Maternal Grandmother    Family Psychiatric  History: Major depression: Mother Social History:  Social History    Substance and Sexual Activity  Alcohol Use Yes   Comment: occ     Social History   Substance and Sexual Activity  Drug Use Yes  . Frequency: 7.0 times per week  . Types: Marijuana   Comment: quit mj    Social History   Socioeconomic History  . Marital status: Single    Spouse name: Not on file  . Number of children: Not on file  . Years of education: Not on file  . Highest education level: Not on file  Occupational History  . Not on file  Tobacco Use  . Smoking status: Current Every Day Smoker    Packs/day: 0.25    Types: Cigarettes  . Smokeless tobacco: Never Used  Substance and Sexual Activity  . Alcohol use: Yes    Comment: occ  . Drug use: Yes    Frequency: 7.0 times per week    Types: Marijuana    Comment: quit mj  . Sexual activity: Not Currently    Birth control/protection: None  Other Topics Concern  . Not on file  Social History Narrative  . Not on file   Social Determinants of Health   Financial Resource Strain:   . Difficulty of Paying Living Expenses:   Food Insecurity:   . Worried About Charity fundraiser in the Last Year:   . Arboriculturist in the Last Year:   Transportation Needs:   . Film/video editor (Medical):   Marland Kitchen Lack of Transportation (Non-Medical):   Physical Activity:   .  Days of Exercise per Week:   . Minutes of Exercise per Session:   Stress:   . Feeling of Stress :   Social Connections:   . Frequency of Communication with Friends and Family:   . Frequency of Social Gatherings with Friends and Family:   . Attends Religious Services:   . Active Member of Clubs or Organizations:   . Attends Archivist Meetings:   Marland Kitchen Marital Status:     Hospital Course:  Patient was started on Lexapro 10 mg for anxiety and depression. She was also started on Gabapentin 100 mg Po TID for anxiety. Vistaril 25 mg and Trazodone were available for anxiety and insomnia. Valtrex was started for herpes infection.  Next day patient  reported Improved mood, Sleep still remains disrupted, sleeps more during the day, Denies in suicidal ideation.  On the day of discharge patient shows improved mood, brighter affect, denies any suicidal ideation, homicidal ideation or AVH. She is leaving the unit in good spirits, continued on same medications.  Physical Findings: AIMS: Facial and Oral Movements Muscles of Facial Expression: None, normal Lips and Perioral Area: None, normal Jaw: None, normal Tongue: None, normal,Extremity Movements Upper (arms, wrists, hands, fingers): None, normal Lower (legs, knees, ankles, toes): None, normal, Trunk Movements Neck, shoulders, hips: None, normal, Overall Severity Severity of abnormal movements (highest score from questions above): None, normal Incapacitation due to abnormal movements: None, normal Patient's awareness of abnormal movements (rate only patient's report): No Awareness, Dental Status Current problems with teeth and/or dentures?: No Does patient usually wear dentures?: No  CIWA:    COWS:     Musculoskeletal: Strength & Muscle Tone: within normal limits Gait & Station: normal Patient leans: N/A  Psychiatric Specialty Exam: Physical Exam Vitals and nursing note reviewed.  Constitutional:      Appearance: Normal appearance.  HENT:     Head: Normocephalic and atraumatic.     Nose: Nose normal.     Mouth/Throat:     Pharynx: Oropharynx is clear.  Eyes:     Pupils: Pupils are equal, round, and reactive to light.  Pulmonary:     Effort: Pulmonary effort is normal.  Genitourinary:    Comments: Deferred Musculoskeletal:        General: Normal range of motion.  Skin:    General: Skin is warm and dry.  Neurological:     General: No focal deficit present.     Mental Status: She is alert and oriented to person, place, and time.     Review of Systems  Blood pressure (!) 106/54, pulse 83, temperature 97.9 F (36.6 C), temperature source Oral, resp. rate 20, height 5'  6" (1.676 m), weight 90.7 kg, SpO2 100 %.Body mass index is 32.28 kg/m.  General Appearance: Casual  Eye Contact:  Good  Speech:  Normal Rate  Volume:  Normal  Mood:  Euthymic  Affect:  Congruent  Thought Process:  Coherent and Descriptions of Associations: Intact  Orientation:  Full (Time, Place, and Person)  Thought Content:  Logical  Suicidal Thoughts:  No  Homicidal Thoughts:  No  Memory:  Immediate;   Fair Recent;   Fair Remote;   Fair  Judgement:  Intact  Insight:  Fair  Psychomotor Activity:  Normal  Concentration:  Concentration: Good and Attention Span: Good  Recall:  [place] of Knowledge:  Good  Language:  Good  Akathisia:  No  Handed:  Right  AIMS (if indicated):     Assets:  Desire for Improvement Resilience  ADL's:  Intact  Cognition:  WNL  Sleep:  Number of Hours: 6.75        Has this patient used any form of tobacco in the last 30 days? (Cigarettes, Smokeless Tobacco, Cigars, and/or Pipes) Yes, N/A  Blood Alcohol level:  Lab Results  Component Value Date   [facility] <10 12/09/2019   ETH <11 67/89/3810    Metabolic Disorder Labs:  Lab Results  Component Value Date   HGBA1C 5.0 12/09/2019   MPG 96.8 12/09/2019   No results found for: PROLACTIN Lab Results  Component Value Date   CHOL 156 12/09/2019   TRIG 85 12/09/2019   HDL 49 12/09/2019   CHOLHDL 3.2 12/09/2019   VLDL 17 12/09/2019   [place] 90 12/09/2019    See Psychiatric Specialty Exam and Suicide Risk Assessment completed by Attending Physician prior to discharge.  Discharge destination:  Home  Is patient on multiple antipsychotic therapies at discharge:  No   Has Patient had three or more failed trials of antipsychotic monotherapy by history:  No  Recommended Plan for Multiple Antipsychotic Therapies: NA  Discharge Instructions    Diet - low sodium heart healthy   Complete by: As directed    Increase activity slowly   Complete by: As directed    Increase activity slowly    Complete by: As directed      Allergies as of 12/15/2019   No Known Allergies     Medication List    STOP taking these medications   amoxicillin 875 MG tablet Commonly known as: AMOXIL   HYDROcodone-acetaminophen 5-325 MG tablet Commonly known as: NORCO/VICODIN   ibuprofen 800 MG tablet Commonly known as: ADVIL     TAKE these medications     Indication  escitalopram 10 MG tablet Commonly known as: LEXAPRO Take 1 tablet (10 mg total) by mouth daily. Start taking on: December 16, 2019  Indication: Major Depressive Disorder   gabapentin 100 MG capsule Commonly known as: NEURONTIN Take 1 capsule (100 mg total) by mouth 3 (three) times daily.  Indication: anxiety   valACYclovir 500 MG tablet Commonly known as: VALTREX Take 1 tablet (500 mg total) by mouth 2 (two) times daily.  Indication: Genital Herpes       Follow-up Information    [facility] on 12/17/2019.   Why: You have an assessment appointment in person on Wednesday 12/17/19 at 1230pm. Please bring your medications.  Contact information: [place] [address] [phone]               Follow-up recommendations:  Activity:  Normal Diet:  Normal  Comments:  Prescriptions given at discharge. Patient agreeable to plan.Given opportunity to ask questions. Appears to feel comfortable with discharge denies any current suicidal or homicidal thought. Patient is also instructed prior to discharge to: Take all medications as prescribed by his/her mental healthcare provider. Report any adverse effects and or reactions from the medicines to his/her outpatient provider promptly. Patient has been instructed & cautioned: To not engage in alcohol and or illegal drug use while on prescription medicines. In the event of worsening symptoms, patient is instructed to call the crisis hotline, 911 and or go to the nearest ED for appropriate evaluation and treatment of symptoms. To follow-up  with his/her primary care provider for your other medical issues, concerns and or health care needs.  Signed: Honor Junes, MD 12/15/2019, 1:13 PM

## 2020-02-17 ENCOUNTER — Other Ambulatory Visit: Payer: Self-pay

## 2020-02-17 ENCOUNTER — Ambulatory Visit: Payer: Medicaid Other | Admitting: Physician Assistant

## 2020-02-17 DIAGNOSIS — Z113 Encounter for screening for infections with a predominantly sexual mode of transmission: Secondary | ICD-10-CM

## 2020-02-17 DIAGNOSIS — Z299 Encounter for prophylactic measures, unspecified: Secondary | ICD-10-CM

## 2020-02-17 LAB — WET PREP FOR TRICH, YEAST, CLUE
Trichomonas Exam: NEGATIVE
Yeast Exam: NEGATIVE

## 2020-02-17 LAB — PREGNANCY, URINE: Preg Test, Ur: NEGATIVE

## 2020-02-17 MED ORDER — ACYCLOVIR 400 MG PO TABS: 400.0000 mg | ORAL_TABLET | Freq: Three times a day (TID) | ORAL | 0 refills | Status: DC

## 2020-02-17 NOTE — Progress Notes (Signed)
PT negative. [employer] results reviewed. Per standing orders no treatment indicated. Hal Morales, RN

## 2020-02-17 NOTE — Progress Notes (Signed)
Here today for STD screening, Accepts bloodwork. Hal Morales, RN

## 2020-02-19 ENCOUNTER — Encounter: Payer: Self-pay | Admitting: Physician Assistant
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
## 2020-02-22 LAB — GONOCOCCUS CULTURE

## 2020-08-03 ENCOUNTER — Ambulatory Visit: Payer: Medicaid Other

## 2020-11-17 ENCOUNTER — Ambulatory Visit: Payer: Medicaid Other

## 2020-12-15 ENCOUNTER — Ambulatory Visit: Payer: Medicaid Other

## 2021-01-14 ENCOUNTER — Other Ambulatory Visit: Payer: Self-pay

## 2021-01-14 ENCOUNTER — Encounter: Payer: Self-pay | Admitting: Family Medicine

## 2021-01-14 ENCOUNTER — Ambulatory Visit: Payer: Medicaid Other

## 2021-01-14 ENCOUNTER — Ambulatory Visit: Payer: Medicaid Other | Admitting: Family Medicine

## 2021-01-14 DIAGNOSIS — B9689 Other specified bacterial agents as the cause of diseases classified elsewhere: Secondary | ICD-10-CM

## 2021-01-14 DIAGNOSIS — N760 Acute vaginitis: Secondary | ICD-10-CM

## 2021-01-14 DIAGNOSIS — Z113 Encounter for screening for infections with a predominantly sexual mode of transmission: Secondary | ICD-10-CM | POA: Diagnosis not present

## 2021-01-14 LAB — WET PREP FOR TRICH, YEAST, CLUE
Trichomonas Exam: NEGATIVE
Yeast Exam: NEGATIVE

## 2021-01-14 MED ORDER — METRONIDAZOLE 500 MG PO TABS: 500.0000 mg | ORAL_TABLET | Freq: Two times a day (BID) | ORAL | 0 refills | Status: AC
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
## 2021-01-14 NOTE — Progress Notes (Signed)
Wet mount reviewed during clinic visit.   Wet mount indicates treatment for BV. RN made mistake thinking prescription was sent over to pharmacy but really it needed to be dispensed in clinic.   Attempt to contact patient 3 times and no answer, LVM regarding need to pick up medication at health department for treatment of BV. Medication that needs dispensed is metroNIDAZOLE (FLAGYL) 500 MG tablet BID for 7 days.   [place] asking if patient stop by to acquire for medication - pharmacist mentioned they do not keep records of when patient's stop by asking for medication.   Gave patient nurse clinic number in maternity to call back or if patient comes by clinic - please dispense medication for her.   Adalberto Cole, RN

## 2021-01-20 NOTE — Progress Notes (Signed)
Patient arrived to clinic to pick up medication for BV.   Medication dispensed per SO and given to patient. Education given.   Adalberto Cole, RN

## 2021-07-26 ENCOUNTER — Ambulatory Visit: Payer: Medicaid Other

## 2021-09-06 ENCOUNTER — Ambulatory Visit: Payer: Medicaid Other | Admitting: Advanced Practice Midwife

## 2021-09-06 ENCOUNTER — Encounter: Payer: Self-pay | Admitting: Advanced Practice Midwife

## 2021-09-06 DIAGNOSIS — F1410 Cocaine abuse, uncomplicated: Secondary | ICD-10-CM

## 2021-09-06 DIAGNOSIS — Z113 Encounter for screening for infections with a predominantly sexual mode of transmission: Secondary | ICD-10-CM | POA: Diagnosis not present

## 2021-09-06 LAB — WET PREP FOR TRICH, YEAST, CLUE
Trichomonas Exam: NEGATIVE
Yeast Exam: NEGATIVE

## 2021-09-06 LAB — HM HIV SCREENING LAB: HM HIV Screening: NEGATIVE

## 2021-09-06 LAB — HM HEPATITIS C SCREENING LAB: HM Hepatitis Screen: NEGATIVE
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
## 2021-09-06 NOTE — Progress Notes (Signed)
Pt here for STD screening.  Wet mount results reviewed, no treatment required per SO.  PCP list and condoms given to pt.  Windle Guard, RN ? ?

## 2021-09-10 LAB — GONOCOCCUS CULTURE

## 2022-01-11 ENCOUNTER — Emergency Department (HOSPITAL_COMMUNITY)
Admission: EM | Admit: 2022-01-11 | Discharge: 2022-01-12 | Disposition: A | Discharge status: Home / Self Care | Payer: Medicaid Other | Attending: Emergency Medicine | Admitting: Emergency Medicine

## 2022-01-11 ENCOUNTER — Encounter (HOSPITAL_COMMUNITY): Payer: Self-pay

## 2022-01-11 ENCOUNTER — Other Ambulatory Visit: Payer: Self-pay

## 2022-01-11 DIAGNOSIS — T402X1A Poisoning by other opioids, accidental (unintentional), initial encounter: Secondary | ICD-10-CM | POA: Diagnosis not present

## 2022-01-11 DIAGNOSIS — X58XXXA Exposure to other specified factors, initial encounter: Secondary | ICD-10-CM | POA: Diagnosis not present

## 2022-01-11 DIAGNOSIS — T50901A Poisoning by unspecified drugs, medicaments and biological substances, accidental (unintentional), initial encounter: Secondary | ICD-10-CM | POA: Diagnosis present

## 2022-01-11 DIAGNOSIS — T40601A Poisoning by unspecified narcotics, accidental (unintentional), initial encounter: Secondary | ICD-10-CM

## 2022-01-11 LAB — CBC WITH DIFFERENTIAL/PLATELET
Abs Immature Granulocytes: 0.02 10*3/uL (ref 0.00–0.07)
Basophils Absolute: 0 10*3/uL (ref 0.0–0.1)
Basophils Relative: 0 %
Eosinophils Absolute: 0.1 10*3/uL (ref 0.0–0.5)
Eosinophils Relative: 2 %
HCT: 37.2 % (ref 36.0–46.0)
Hemoglobin: 12.1 g/dL (ref 12.0–15.0)
Immature Granulocytes: 0 %
Lymphocytes Relative: 35 %
Lymphs Abs: 3.2 10*3/uL (ref 0.7–4.0)
MCH: 30.6 pg (ref 26.0–34.0)
MCHC: 32.5 g/dL (ref 30.0–36.0)
MCV: 94.2 fL (ref 80.0–100.0)
Monocytes Absolute: 0.5 10*3/uL (ref 0.1–1.0)
Monocytes Relative: 6 %
Neutro Abs: 5.3 10*3/uL (ref 1.7–7.7)
Neutrophils Relative %: 57 %
Platelets: 346 10*3/uL (ref 150–400)
RBC: 3.95 MIL/uL (ref 3.87–5.11)
RDW: 12.4 % (ref 11.5–15.5)
WBC: 9.2 10*3/uL (ref 4.0–10.5)
nRBC: 0 % (ref 0.0–0.2)

## 2022-01-11 LAB — I-STAT BETA HCG BLOOD, ED (MC, WL, AP ONLY): I-stat hCG, quantitative: <5 m[IU]/mL (ref <5)

## 2022-01-11 LAB — COMPREHENSIVE METABOLIC PANEL
ALT: 22 U/L (ref 0–44)
AST: 26 U/L (ref 15–41)
Albumin: 3.8 g/dL (ref 3.5–5.0)
Alkaline Phosphatase: 83 U/L (ref 38–126)
Anion gap: 3 — ABNORMAL LOW (ref 5–15)
BUN: 18 mg/dL (ref 6–20)
CO2: 26 mmol/L (ref 22–32)
Calcium: 9 mg/dL (ref 8.9–10.3)
Chloride: 112 mmol/L — ABNORMAL HIGH (ref 98–111)
Creatinine, Ser: 0.8 mg/dL (ref 0.44–1.00)
GFR, Estimated: >60 mL/min (ref >60)
Glucose, Bld: 110 mg/dL — ABNORMAL HIGH (ref 70–99)
Potassium: 4 mmol/L (ref 3.5–5.1)
Sodium: 141 mmol/L (ref 135–145)
Total Bilirubin: <0.1 mg/dL — ABNORMAL LOW (ref 0.3–1.2)
Total Protein: 7.5 g/dL (ref 6.5–8.1)

## 2022-01-11 LAB — ETHANOL: Alcohol, Ethyl (B): <10 mg/dL (ref <10)

## 2022-01-11 NOTE — ED Provider Notes (Signed)
23:45: Assumed care of patient from Dr. Maryan Rued at shift change pending reassessment and disposition.  Please see prior provider note for full H&P.  Briefly patient is a 36 year old female who presented to the ED status post unintentional overdose, utilizing cocaine that was likely laced with opioids.  Received 0.5 mg of intranasal Narcan followed by 1 mg of IV Narcan.  She is maintaining her respiratory drive, however still is a bit groggy, plan is for metabolization and likely discharge home.  02:00: Patient observed in the ED, she is awake, alert, oriented x4 and is ambulatory and tolerating p.o. with request for discharge.  She is not driving.  She overall seems reasonable for discharge home at this time. I discussed results, treatment plan, need for follow-up, and return precautions with the patient. Provided opportunity for questions, patient confirmed understanding and is in agreement with plan.    Leafy Kindle 03/47/42 [id number]    Delora Fuel, MD 38/75/64 [phone]

## 2022-01-11 NOTE — Discharge Instructions (Addendum)
You were seen in the emergency department today after an accidental overdose.  Your labs were overall reassuring.  Please avoid future substance use for your safety.  Do not drive or operate heavy machinery for 24 hours.  Follow-up with primary care within 3 days.  Return to the ER for any new or worsening symptoms or any other concerns.

## 2022-01-11 NOTE — ED Triage Notes (Signed)
Pt BIB EMS from home pt was found unconscious after snorting what she thought was cocaine. EMS administered .'5mg'$  Narcan intranasally and '1mg'$  Narcan IV. Pt reports feeling dizzy.

## 2022-01-11 NOTE — ED Provider Notes (Signed)
[facility] DEPT Provider Note   CSN: [id number] Arrival date & time: 01/11/22  1834     History  Chief Complaint  Patient presents with   Drug Overdose    Jennifer Cortez is a 36 y.o. female.  Patient is a 36 year old female with a history of anxiety and depression and polysubstance use who is presenting today via EMS after being found unresponsive at her home after snorting what she thought was cocaine.  Patient reports that she had been drinking and had used some cocaine earlier today.  She snorted a white powder which she thought was cocaine and that is the last thing she remembers.  When EMS arrived patient was found unconscious with agonal respirations.  She was given 0.4 mg of Narcan intranasally and then an IV was established and she was given 1 mg of Narcan.  Patient did wake up but complains of feeling dizzy and sleepy.  She reports that she does not use narcotics regularly and she did not know that is what she was snorting.  She does not take any medications regularly.  Denies self intent to harm.  The history is provided by the patient and the EMS personnel.  Drug Overdose       Home Medications Prior to Admission medications   Medication Sig Start Date End Date Taking? Authorizing Provider  valACYclovir (VALTREX) 500 MG tablet Take 1 tablet (500 mg total) by mouth 2 (two) times daily. Patient taking differently: Take 500 mg by mouth 2 (two) times daily as needed (outbreaks). 12/15/19  Yes Sharma Covert, MD  acyclovir (ZOVIRAX) 400 MG tablet Take 1 tablet (400 mg total) by mouth 3 (three) times daily. Patient not taking: Reported on 01/14/2021 02/17/20   Jerene Dilling, PA  escitalopram (LEXAPRO) 10 MG tablet Take 1 tablet (10 mg total) by mouth daily. Patient not taking: Reported on 01/14/2021 12/16/19   Sharma Covert, MD  gabapentin (NEURONTIN) 100 MG capsule Take 1 capsule (100 mg total) by mouth 3 (three) times daily. Patient  not taking: Reported on 01/14/2021 12/15/19   Sharma Covert, MD      Allergies    Patient has no known allergies.    Review of Systems   Review of Systems  Physical Exam Updated Vital Signs BP (!) 148/89   Pulse 66   Temp 97.9 F (36.6 C)   Resp 16   Ht '5\' 5"'$  (1.651 m)   Wt 90.7 kg   SpO2 100%   BMI 33.28 kg/m  Physical Exam Vitals and nursing note reviewed.  Constitutional:      General: She is not in acute distress.    Appearance: She is well-developed.     Comments: Patient is sleepy but is easily arousable and able to answer questions  HENT:     Head: Normocephalic and atraumatic.     Mouth/Throat:     Mouth: Mucous membranes are moist.  Eyes:     Comments: Pupils are 2 mm bilaterally and minimally reactive  Cardiovascular:     Rate and Rhythm: Normal rate and regular rhythm.     Heart sounds: Normal heart sounds. No murmur heard.    No friction rub.  Pulmonary:     Effort: Pulmonary effort is normal.     Breath sounds: Normal breath sounds. No wheezing or rales.  Abdominal:     General: Bowel sounds are normal. There is no distension.     Palpations: Abdomen is soft.  Tenderness: There is no abdominal tenderness. There is no guarding or rebound.  Musculoskeletal:        General: No tenderness. Normal range of motion.     Comments: No edema  Skin:    General: Skin is warm and dry.     Findings: No rash.  Neurological:     Comments: Patient is oriented to person and place  Psychiatric:        Behavior: Behavior normal.     Comments: Denies SI or HI.  She was recreationally using drugs and reports that she thought this was cocaine     ED Results / Procedures / Treatments   Labs (all labs ordered are listed, but only abnormal results are displayed) Labs Reviewed  COMPREHENSIVE METABOLIC PANEL - Abnormal; Notable for the following components:      Result Value   Chloride 112 (*)    Glucose, Bld 110 (*)    Total Bilirubin <0.1 (*)    Anion gap 3  (*)    All other components within normal limits  CBC WITH DIFFERENTIAL/PLATELET  ETHANOL  I-STAT BETA HCG BLOOD, ED ([facility], [facility], [facility] ONLY)    EKG EKG Interpretation  Date/Time:  Wednesday January 11 2022 18:41:38 EDT Ventricular Rate:  75 PR Interval:  161 QRS Duration: 86 QT Interval:  406 QTC Calculation: 454 R Axis:   58 Text Interpretation: Sinus rhythm Normal ECG Confirmed by Blanchie Dessert [phone]) on 01/11/2022 7:00:44 PM  Radiology No results found.  Procedures Procedures    Medications Ordered in ED Medications - No data to display  ED Course/ Medical Decision Making/ A&P                           Medical Decision Making Amount and/or Complexity of Data Reviewed Labs: ordered. Decision-making details documented in ED Course.   Pt presenting today with a complaint that caries a high risk for morbidity and mortality.  Here today after an unintentional opiate overdose.  Patient snorted a white powder that she thought was cocaine.  When EMS arrived who gave the history patient was agonal and unconscious.  She responded to Narcan.  Currently she is not back to baseline and is still sleepy but is able to answer questions.  Patient also reports she had been drinking alcohol today.  She denies any SI.  Will monitor patient closely as she may need repeat doses of Narcan.  Concern for long-acting opiate.  11:57 PM I independently interpreted patient's labs, EtOH is negative, CMP without acute findings, CBC within normal limits and hCG is negative.  On repeat evaluation multiple times patient will still wake up to voice and is becoming more alert but is still not back to her baseline.  She is still having some hallucinations.  At this time her airway is intact.         Final Clinical Impression(s) / ED Diagnoses Final diagnoses:  Opiate overdose, accidental or unintentional, initial encounter [facility]    Rx / DC Orders ED Discharge Orders     None          Blanchie Dessert, MD 01/11/22 2357

## 2022-01-12 NOTE — ED Notes (Signed)
Pt ambulated to BR w/one assist, steady gait noted 

## 2022-01-31 ENCOUNTER — Ambulatory Visit: Payer: Medicaid Other

## 2022-05-17 ENCOUNTER — Ambulatory Visit (INDEPENDENT_AMBULATORY_CARE_PROVIDER_SITE_OTHER): Payer: Medicaid Other | Admitting: Physician Assistant

## 2022-05-17 ENCOUNTER — Other Ambulatory Visit: Payer: Self-pay | Admitting: Physician Assistant

## 2022-05-17 ENCOUNTER — Encounter: Payer: Self-pay | Admitting: Physician Assistant

## 2022-05-17 VITALS — BP 117/84 | HR 76 | Ht 65.0 in | Wt 222.0 lb

## 2022-05-17 DIAGNOSIS — N83209 Unspecified ovarian cyst, unspecified side: Secondary | ICD-10-CM

## 2022-05-17 DIAGNOSIS — Z113 Encounter for screening for infections with a predominantly sexual mode of transmission: Secondary | ICD-10-CM

## 2022-05-17 DIAGNOSIS — F1920 Other psychoactive substance dependence, uncomplicated: Secondary | ICD-10-CM

## 2022-05-17 DIAGNOSIS — T7421XA Adult sexual abuse, confirmed, initial encounter: Secondary | ICD-10-CM

## 2022-05-17 DIAGNOSIS — Z7689 Persons encountering health services in other specified circumstances: Secondary | ICD-10-CM

## 2022-05-17 DIAGNOSIS — F319 Bipolar disorder, unspecified: Secondary | ICD-10-CM | POA: Diagnosis not present

## 2022-05-17 DIAGNOSIS — T7421XS Adult sexual abuse, confirmed, sequela: Secondary | ICD-10-CM

## 2022-05-17 DIAGNOSIS — Z3202 Encounter for pregnancy test, result negative: Secondary | ICD-10-CM | POA: Diagnosis not present

## 2022-05-17 DIAGNOSIS — F419 Anxiety disorder, unspecified: Secondary | ICD-10-CM

## 2022-05-17 DIAGNOSIS — F32A Depression, unspecified: Secondary | ICD-10-CM

## 2022-05-17 DIAGNOSIS — N898 Other specified noninflammatory disorders of vagina: Secondary | ICD-10-CM | POA: Diagnosis not present

## 2022-05-17 LAB — POCT URINALYSIS DIPSTICK
Bilirubin, UA: NEGATIVE
Blood, UA: NEGATIVE
Glucose, UA: NEGATIVE
Ketones, UA: NEGATIVE
Leukocytes, UA: NEGATIVE
Nitrite, UA: NEGATIVE
Protein, UA: NEGATIVE
Spec Grav, UA: >=1.03 — AB (ref 1.010–1.025)
Urobilinogen, UA: 0.2 E.U./dL
pH, UA: 6 (ref 5.0–8.0)

## 2022-05-17 LAB — POCT URINE PREGNANCY: Preg Test, Ur: NEGATIVE
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
## 2022-05-18 LAB — RPR+HBSAG+HIV
HIV Screen 4th Generation wRfx: NONREACTIVE
Hepatitis B Surface Ag: NEGATIVE
RPR Ser Ql: NONREACTIVE

## 2022-05-19 ENCOUNTER — Other Ambulatory Visit: Payer: Self-pay | Admitting: Physician Assistant

## 2022-05-19 DIAGNOSIS — B9689 Other specified bacterial agents as the cause of diseases classified elsewhere: Secondary | ICD-10-CM

## 2022-05-19 LAB — NUSWAB VAGINITIS PLUS (VG+)
Atopobium vaginae: HIGH Score — AB
BVAB 2: HIGH Score — AB
Candida albicans, NAA: NEGATIVE
Candida glabrata, NAA: NEGATIVE
Chlamydia trachomatis, NAA: NEGATIVE
Megasphaera 1: HIGH Score — AB
Neisseria gonorrhoeae, NAA: NEGATIVE
Trich vag by NAA: NEGATIVE

## 2022-05-19 MED ORDER — METRONIDAZOLE 500 MG PO TABS: 500.0000 mg | ORAL_TABLET | Freq: Two times a day (BID) | ORAL | 0 refills | Status: AC

## 2022-05-19 NOTE — Progress Notes (Signed)
Patient advised.

## 2022-05-19 NOTE — Progress Notes (Signed)
Metronidazole 500 mg orally BID for 7 days Advise patients to avoid alcohol during treatment with oral metronidazole and for 24 hours following.

## 2022-06-13 ENCOUNTER — Ambulatory Visit: Payer: Self-pay | Admitting: Nurse Practitioner

## 2022-07-03 NOTE — Progress Notes (Deleted)
    GYNECOLOGY PROGRESS NOTE  Subjective:    Patient ID: Jennifer Cortez, female    DOB: December 16, 1985, 37 y.o.   MRN: [id number]  HPI  Patient is a 37 y.o. [id number] female who presents to establish care and has questions about ovarian cyst was referred from PCP.  The following portions of the patient's history were reviewed and updated as appropriate: allergies, current medications, past family history, past medical history, past social history, past surgical history, and problem list.  Review of Systems Pertinent items are noted in HPI.   Objective:   There were no vitals taken for this visit. There is no height or weight on file to calculate BMI. General appearance: alert, cooperative, and no distress Abdomen: {abdominal exam:16834} Pelvic: {pelvic exam:16852::"cervix normal in appearance","external genitalia normal","no adnexal masses or tenderness","no cervical motion tenderness","rectovaginal septum normal","uterus normal size, shape, and consistency","vagina normal without discharge"} Extremities: {extremity exam:5109} Neurologic: {neuro exam:17854}   Assessment:   No diagnosis found.   Plan:   There are no diagnoses linked to this encounter.

## 2022-07-04 ENCOUNTER — Encounter: Payer: Medicaid Other | Admitting: Obstetrics and Gynecology

## 2022-07-07 ENCOUNTER — Ambulatory Visit: Payer: Medicaid Other | Admitting: Physician Assistant

## 2022-07-07 NOTE — Progress Notes (Deleted)
   Argentina Ponder Chenoah Mcnally,acting as a Education administrator for [employer], PA-C.,have documented all relevant documentation on the behalf of Mardene Speak, PA-C,as directed by  [employer], PA-C while in the presence of [employer], PA-C.     Established patient visit   Patient: Jennifer Cortez   DOB: 1985/10/02   37 y.o. Female  MRN: [id number] Visit Date: 07/07/2022  Today's healthcare provider: Mardene Speak, PA-C   No chief complaint on file.  Subjective    HPI  Patient is a 37 year old female who presents for evaluation of diarrhea and sore throat.   Medications: Outpatient Medications Prior to Visit  Medication Sig   methocarbamol (ROBAXIN) 500 MG tablet Take 500 mg by mouth 4 (four) times daily as needed.   valACYclovir (VALTREX) 500 MG tablet Take 1 tablet (500 mg total) by mouth 2 (two) times daily. (Patient taking differently: Take 500 mg by mouth 2 (two) times daily as needed (outbreaks).)   No facility-administered medications prior to visit.    Review of Systems  {Labs  Heme  Chem  Endocrine  Serology  Results Review (optional):23779}   Objective    There were no vitals taken for this visit. {Show previous vital signs (optional):23777}  Physical Exam  ***  No results found for any visits on 07/07/22.  Assessment & Plan     ***  No follow-ups on file.      {provider attestation***:1}   Mardene Speak, PA-C  [place] [phone] (phone) [phone] (fax)  [place]

## 2022-07-10 ENCOUNTER — Ambulatory Visit: Payer: Medicaid Other | Admitting: Family Medicine

## 2022-08-29 ENCOUNTER — Encounter: Payer: Self-pay | Admitting: Advanced Practice Midwife

## 2022-08-29 ENCOUNTER — Ambulatory Visit: Payer: Medicaid Other | Admitting: Advanced Practice Midwife

## 2022-08-29 DIAGNOSIS — R87619 Unspecified abnormal cytological findings in specimens from cervix uteri: Secondary | ICD-10-CM

## 2022-08-29 DIAGNOSIS — Z113 Encounter for screening for infections with a predominantly sexual mode of transmission: Secondary | ICD-10-CM | POA: Diagnosis not present

## 2022-08-29 DIAGNOSIS — A609 Anogenital herpesviral infection, unspecified: Secondary | ICD-10-CM

## 2022-08-29 LAB — WET PREP FOR TRICH, YEAST, CLUE
Trichomonas Exam: NEGATIVE
Yeast Exam: NEGATIVE

## 2022-08-29 LAB — HM HEPATITIS C SCREENING LAB: HM Hepatitis Screen: NEGATIVE

## 2022-08-29 LAB — HM HIV SCREENING LAB: HM HIV Screening: NEGATIVE

## 2022-08-29 MED ORDER — ACYCLOVIR 800 MG PO TABS
800.0000 mg | ORAL_TABLET | Freq: Two times a day (BID) | ORAL | 11 refills | Status: AC
Start: 2022-08-29 — End: 2022-09-03

## 2022-08-29 NOTE — Progress Notes (Signed)
Pt appointment for STI screening. Seen by Lesia Sago. Wet prep results reviewed with pt.
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
## 2022-09-02 LAB — GONOCOCCUS CULTURE

## 2022-09-07 ENCOUNTER — Telehealth: Payer: Self-pay

## 2022-09-07 NOTE — Telephone Encounter (Signed)
Pt notified of positive Gonorrhea results.  Treatment appointment made for 5/10 @ 9:10 am.  Berdie Ogren, RN

## 2022-09-08 ENCOUNTER — Ambulatory Visit: Payer: Medicaid Other

## 2022-09-08 DIAGNOSIS — A549 Gonococcal infection, unspecified: Secondary | ICD-10-CM

## 2022-09-08 DIAGNOSIS — Z113 Encounter for screening for infections with a predominantly sexual mode of transmission: Secondary | ICD-10-CM

## 2022-09-08 MED ORDER — CEFTRIAXONE SODIUM 500 MG IJ SOLR
500.0000 mg | Freq: Once | INTRAMUSCULAR | Status: AC
Start: 2022-09-08 — End: 2022-09-08
  Administered 2022-09-08: 500 mg via INTRAMUSCULAR

## 2022-09-08 NOTE — Progress Notes (Signed)
In nurse clinic for gonorrhea tx. NKA. Patient states she has "couple more days" of BV treatment because she started taking the med this week instead of last week d/t vacation. Treated today per SO Dr Ralene Bathe with Ceftriaxone 500 mg IM once. Tolerated injection well. Stayed for 15 min after injection without problem.  Ceftriaxone info sheet given. Jerel Shepherd, RN

## 2022-09-12 ENCOUNTER — Encounter: Payer: Medicaid Other | Admitting: Obstetrics and Gynecology

## 2022-09-12 DIAGNOSIS — Z7689 Persons encountering health services in other specified circumstances: Secondary | ICD-10-CM

## 2022-09-12 NOTE — Progress Notes (Deleted)
GYNECOLOGY ANNUAL PHYSICAL EXAM PROGRESS NOTE  Subjective:    Jennifer Cortez is a 37 y.o. [id number] female who presents for an annual exam. The patient {is/is not/has never been:13135} sexually active. The patient participates in regular exercise: {yes/no/not asked:9010}. Has the patient ever been transfused or tattooed?: {yes/no/not asked:9010}. The patient reports that there {is/is not:9024} domestic violence in her life.   The patient has the following complaints today.  She was referred by her PCP Debera Lat for concerns of ovarian cysts. She was diagnosed with ovarian cyst 8-9 years ago.     Menstrual History: Menarche age: *** Patient's last menstrual period was 08/14/2022 (exact date).     Gynecologic History:  Contraception: tubal ligation History of STI's:  Last Pap: 12/12/2016. Results were: abnormal. Notes h/o abnormal pap smears (HPV detected in 2018) Last mammogram: Not age appropriate   OB History  Gravida Para Term Preterm AB Living  10 6 3 3 4 6   SAB IAB Ectopic Multiple Live Births  1 3 0 0 6    # Outcome Date GA Lbr Len/2nd Weight Sex Delivery Anes PTL Lv  10 Preterm 02/20/17 [redacted]w[redacted]d  4 lb 9 oz (2.07 kg) F CS-LTranv Spinal  LIV     Name: Voght,GIRL Quenisha     Apgar1: 8  Apgar5: 3  9 Preterm 11/08/15 [redacted]w[redacted]d 03:24 / 00:22 6 lb 12.6 oz (3.08 kg) M Vag-Spont None  LIV     Name: Hush,BOY Zarai     Apgar1: 7  Apgar5: 8  8 IAB 10/2014          7 Term 05/01/14 [redacted]w[redacted]d 05:12 / 01:15 6 lb 7 oz (2.92 kg) F Vag-Spont EPI  LIV     Birth Comments: Failed newborn hearing screen but passed at recheck 05/20/14     Apgar1: 9  Apgar5: 9  6 SAB 03/02/11          5 IAB 01/2010          4 Term 08/05/07 [redacted]w[redacted]d   M Vag-Spont   LIV  3 IAB 05/2004          2 Term 04/26/03 [redacted]w[redacted]d   F Vag-Spont   LIV  1 Preterm 09/08/01 [redacted]w[redacted]d   F Vag-Spont   LIV    Past Medical History:  Diagnosis Date   Allergy    Anxiety    Asthma    Bipolar 1 disorder (HCC)    Depression     Dermoid cyst of right ovary     5 cm dermoid in right ovary -> plan laparoscopic removal after delivery.   Gall stones    HSV (herpes simplex virus) infection    Substance abuse [facility])     Past Surgical History:  Procedure Laterality Date   CESAREAN SECTION N/A 02/20/2017   Procedure: CESAREAN SECTION;  Surgeon: Levie Heritage, DO;  Location: [facility];  Service: Obstetrics;  Laterality: N/A;   DILATION AND CURETTAGE OF UTERUS     gall stones     MULTIPLE TOOTH EXTRACTIONS     TUBAL LIGATION      Family History  Problem Relation Age of Onset   Hypertension Mother    Diabetes Maternal Grandmother    Hepatitis B Maternal Grandmother    Prostate cancer Maternal Grandfather     Social History   Socioeconomic History   Marital status: Single    Spouse name: Not on file   Number of children: 6   Years of education:  Not on file   Highest education level: Not on file  Occupational History   Not on file  Tobacco Use   Smoking status: Some Days    Packs/day: .25    Types: Cigarettes, E-cigarettes, Cigars   Smokeless tobacco: Never  Vaping Use   Vaping Use: Never used  Substance and Sexual Activity   Alcohol use: Yes    Alcohol/week: 5.0 standard drinks of alcohol    Types: 3 Shots of liquor, 2 Standard drinks or equivalent per week    Comment: last use 08/26/22   Drug use: Yes    Types: Marijuana    Comment: last MJ 08/26/22; last cocaine 08/26/22   Sexual activity: Yes    Partners: Male    Birth control/protection: Surgical  Other Topics Concern   Not on file  Social History Narrative   Not on file   Social Determinants of Health   Financial Resource Strain: Not on file  Food Insecurity: Not on file  Transportation Needs: Not on file  Physical Activity: Not on file  Stress: Not on file  Social Connections: Not on file  Intimate Partner Violence: Not At Risk (01/14/2021)   Humiliation, Afraid, Rape, and Kick questionnaire    Fear of Current or  Ex-Partner: No    Emotionally Abused: No    Physically Abused: No    Sexually Abused: No    Current Outpatient Medications on File Prior to Visit  Medication Sig Dispense Refill   methocarbamol (ROBAXIN) 500 MG tablet Take 500 mg by mouth 4 (four) times daily as needed. (Patient not taking: Reported on 08/29/2022)     No current facility-administered medications on file prior to visit.    No Known Allergies   Review of Systems Constitutional: negative for chills, fatigue, fevers and sweats Eyes: negative for irritation, redness and visual disturbance Ears, nose, mouth, throat, and face: negative for hearing loss, nasal congestion, snoring and tinnitus Respiratory: negative for asthma, cough, sputum Cardiovascular: negative for chest pain, dyspnea, exertional chest pressure/discomfort, irregular heart beat, palpitations and syncope Gastrointestinal: negative for abdominal pain, change in bowel habits, nausea and vomiting Genitourinary: negative for abnormal menstrual periods, genital lesions, sexual problems and vaginal discharge, dysuria and urinary incontinence Integument/breast: negative for breast lump, breast tenderness and nipple discharge Hematologic/lymphatic: negative for bleeding and easy bruising Musculoskeletal:negative for back pain and muscle weakness Neurological: negative for dizziness, headaches, vertigo and weakness Endocrine: negative for diabetic symptoms including polydipsia, polyuria and skin dryness Allergic/Immunologic: negative for hay fever and urticaria      Objective:  Last menstrual period 08/14/2022. There is no height or weight on file to calculate BMI.    General Appearance:    Alert, cooperative, no distress, appears stated age  Head:    Normocephalic, without obvious abnormality, atraumatic  Eyes:    PERRL, conjunctiva/corneas clear, EOM's intact, both eyes  Ears:    Normal external ear canals, both ears  Nose:   Nares normal, septum midline,  mucosa normal, no drainage or sinus tenderness  Throat:   Lips, mucosa, and tongue normal; teeth and gums normal  Neck:   Supple, symmetrical, trachea midline, no adenopathy; thyroid: no enlargement/tenderness/nodules; no carotid bruit or JVD  Back:     Symmetric, no curvature, ROM normal, no CVA tenderness  Lungs:     Clear to auscultation bilaterally, respirations unlabored  Chest Wall:    No tenderness or deformity   Heart:    Regular rate and rhythm, S1 and S2 normal, no murmur,  rub or gallop  Breast Exam:    No tenderness, masses, or nipple abnormality  Abdomen:     Soft, non-tender, bowel sounds active all four quadrants, no masses, no organomegaly.    Genitalia:    Pelvic:external genitalia normal, vagina without lesions, discharge, or tenderness, rectovaginal septum  normal. Cervix normal in appearance, no cervical motion tenderness, no adnexal masses or tenderness.  Uterus normal size, shape, mobile, regular contours, nontender.  Rectal:    Normal external sphincter.  No hemorrhoids appreciated. Internal exam not done.   Extremities:   Extremities normal, atraumatic, no cyanosis or edema  Pulses:   2+ and symmetric all extremities  Skin:   Skin color, texture, turgor normal, no rashes or lesions  Lymph nodes:   Cervical, supraclavicular, and axillary nodes normal  Neurologic:   CNII-XII intact, normal strength, sensation and reflexes throughout   .  Labs:  Lab Results  Component Value Date   WBC 9.2 01/11/2022   HGB 12.1 01/11/2022   HCT 37.2 01/11/2022   MCV 94.2 01/11/2022   PLT 346 01/11/2022    Lab Results  Component Value Date   CREATININE 0.80 01/11/2022   BUN 18 01/11/2022   NA 141 01/11/2022   K 4.0 01/11/2022   CL 112 (H) 01/11/2022   CO2 26 01/11/2022    Lab Results  Component Value Date   ALT 22 01/11/2022   AST 26 01/11/2022   ALKPHOS 83 01/11/2022   BILITOT <0.1 (L) 01/11/2022    Lab Results  Component Value Date   TSH 0.507 12/09/2019      Assessment:   1. Encounter to establish care      Plan:  Blood tests: UTD. Breast self exam technique reviewed and patient encouraged to perform self-exam monthly. Contraception: tubal ligation. Discussed healthy lifestyle modifications. Mammogram  : Not age appropriate Pap smear ordered. COVID vaccination status: Follow up in 1 year for annual exam   Hildred Laser, MD [facility]

## 2022-09-18 ENCOUNTER — Encounter: Payer: Self-pay | Admitting: Obstetrics and Gynecology

## 2022-11-09 NOTE — Progress Notes (Deleted)
  Established patient visit  Patient: Jennifer Cortez   DOB: 03-03-1986   37 y.o. Female  MRN: [id number] Visit Date: 11/10/2022  Today's healthcare provider: Debera Lat, PA-C   No chief complaint on file.  Subjective    HPI  *** Discussed the use of AI scribe software for clinical note transcription with the patient, who gave verbal consent to proceed.  History of Present Illness               05/17/2022    1:50 PM 01/25/2016    1:37 PM 04/01/2014    8:35 AM  Depression screen PHQ 2/9  Decreased Interest 0 2 0  Down, Depressed, Hopeless 0 2 0  PHQ - 2 Score 0 4 0  Altered sleeping 0 3   Tired, decreased energy 2 3   Change in appetite 0 2   Feeling bad or failure about yourself  2 2   Trouble concentrating 1 1   Moving slowly or fidgety/restless 1 1   Suicidal thoughts 0 0   PHQ-9 Score 6 16   Difficult doing work/chores Not difficult at all        01/25/2016    1:38 PM  GAD 7 : Generalized Anxiety Score  Nervous, Anxious, on Edge 2  Control/stop worrying 2  Worry too much - different things 3  Trouble relaxing 2  Restless 1  Easily annoyed or irritable 3  Afraid - awful might happen 2  Total GAD 7 Score 15    Medications: Outpatient Medications Prior to Visit  Medication Sig   methocarbamol (ROBAXIN) 500 MG tablet Take 500 mg by mouth 4 (four) times daily as needed. (Patient not taking: Reported on 08/29/2022)   No facility-administered medications prior to visit.    Review of Systems Except see HPI   {Labs  Heme  Chem  Endocrine  Serology  Results Review (optional):23779}   Objective    There were no vitals taken for this visit. {Show previous vital signs (optional):23777}  Physical Exam   No results found for any visits on 11/10/22.  Assessment & Plan    *** Assessment and Plan              No follow-ups on file.      [facility]

## 2022-11-10 ENCOUNTER — Ambulatory Visit: Payer: Medicaid Other | Admitting: Physician Assistant

## 2022-11-10 DIAGNOSIS — J069 Acute upper respiratory infection, unspecified: Secondary | ICD-10-CM

## 2022-11-10 DIAGNOSIS — N898 Other specified noninflammatory disorders of vagina: Secondary | ICD-10-CM

## 2022-11-15 ENCOUNTER — Ambulatory Visit: Payer: MEDICAID | Admitting: Physician Assistant

## 2022-11-15 NOTE — Progress Notes (Deleted)
  Established patient visit  Patient: Jennifer Cortez   DOB: 08/23/1985   37 y.o. Female  MRN: [id number] Visit Date: 11/15/2022  Today's healthcare provider: Debera Lat, PA-C   No chief complaint on file.  Subjective    HPI  *** Discussed the use of AI scribe software for clinical note transcription with the patient, who gave verbal consent to proceed.  History of Present Illness               05/17/2022    1:50 PM 01/25/2016    1:37 PM 04/01/2014    8:35 AM  Depression screen PHQ 2/9  Decreased Interest 0 2 0  Down, Depressed, Hopeless 0 2 0  PHQ - 2 Score 0 4 0  Altered sleeping 0 3   Tired, decreased energy 2 3   Change in appetite 0 2   Feeling bad or failure about yourself  2 2   Trouble concentrating 1 1   Moving slowly or fidgety/restless 1 1   Suicidal thoughts 0 0   PHQ-9 Score 6 16   Difficult doing work/chores Not difficult at all        01/25/2016    1:38 PM  GAD 7 : Generalized Anxiety Score  Nervous, Anxious, on Edge 2  Control/stop worrying 2  Worry too much - different things 3  Trouble relaxing 2  Restless 1  Easily annoyed or irritable 3  Afraid - awful might happen 2  Total GAD 7 Score 15    Medications: Outpatient Medications Prior to Visit  Medication Sig   methocarbamol (ROBAXIN) 500 MG tablet Take 500 mg by mouth 4 (four) times daily as needed. (Patient not taking: Reported on 08/29/2022)   No facility-administered medications prior to visit.    Review of Systems Except see HPI   {Insert previous labs (optional):23779}  {See past labs  Heme  Chem  Endocrine  Serology  Results Review (optional):1}   Objective    There were no vitals taken for this visit. {Insert last BP/Wt (optional):23777}  {See vitals history (optional):1}  Physical Exam   No results found for any visits on 11/15/22.  Assessment & Plan    *** Assessment and Plan              No follow-ups on file.      [facility]

## 2022-11-17 ENCOUNTER — Ambulatory Visit (INDEPENDENT_AMBULATORY_CARE_PROVIDER_SITE_OTHER): Payer: MEDICAID | Admitting: Physician Assistant

## 2022-11-17 ENCOUNTER — Other Ambulatory Visit (HOSPITAL_COMMUNITY)
Admission: RE | Admit: 2022-11-17 | Discharge: 2022-11-17 | Disposition: A | Discharge status: Home / Self Care | Payer: MEDICAID | Source: Ambulatory Visit | Attending: Physician Assistant | Admitting: Physician Assistant

## 2022-11-17 ENCOUNTER — Encounter: Payer: Self-pay | Admitting: Physician Assistant

## 2022-11-17 VITALS — BP 105/75 | HR 76 | Temp 98.1°F | Resp 16 | Ht 65.0 in | Wt 225.3 lb

## 2022-11-17 DIAGNOSIS — R399 Unspecified symptoms and signs involving the genitourinary system: Secondary | ICD-10-CM

## 2022-11-17 DIAGNOSIS — J329 Chronic sinusitis, unspecified: Secondary | ICD-10-CM | POA: Diagnosis not present

## 2022-11-17 DIAGNOSIS — N898 Other specified noninflammatory disorders of vagina: Secondary | ICD-10-CM

## 2022-11-17 DIAGNOSIS — R8290 Unspecified abnormal findings in urine: Secondary | ICD-10-CM

## 2022-11-17 LAB — POCT URINALYSIS DIPSTICK
Bilirubin, UA: NEGATIVE
Blood, UA: NEGATIVE
Glucose, UA: NEGATIVE
Ketones, UA: NEGATIVE
Leukocytes, UA: NEGATIVE
Nitrite, UA: NEGATIVE
Protein, UA: NEGATIVE
Spec Grav, UA: 1.02 (ref 1.010–1.025)
Urobilinogen, UA: 0.2 E.U./dL
pH, UA: 6 (ref 5.0–8.0)

## 2022-11-17 MED ORDER — CETIRIZINE HCL 10 MG PO TABS: 10.0000 mg | ORAL_TABLET | Freq: Every day | ORAL | 11 refills | Status: DC

## 2022-11-17 MED ORDER — FLUTICASONE PROPIONATE 50 MCG/ACT NA SUSP: 2.0000 | Freq: Every day | NASAL | 6 refills | Status: DC

## 2022-11-17 NOTE — Progress Notes (Unsigned)
Established patient visit  Patient: Jennifer Cortez   DOB: November 03, 1985   37 y.o. Female  MRN: [id number] Visit Date: 11/17/2022  Today's healthcare provider: Debera Lat, PA-C   Chief Complaint  Patient presents with   URI    She reports green/yellow phlegm and mucus.    Vaginal Discharge    X a few weeks.    Subjective    HPI HPI     URI   Associated symptoms inlclude congestion, headache and sinus pain.  Recent episode started 1 to 4 weeks ago.  The problem has been unchanged since onset.  The temperature has been with in normal range.  Patient  is drinking plenty of fluids.  Past hisotry is significant for  no history of pneumonia or bronchitis.  Patient is not a smoker. Additional comments: She reports green/yellow phlegm and mucus.         Vaginal Discharge    Additional comments: X a few weeks.       Last edited by Myles Lipps, CMA on 11/17/2022 11:28 AM.      *** Discussed the use of AI scribe software for clinical note transcription with the patient, who gave verbal consent to proceed.  History of Present Illness        Upper Respiratory Symptoms: Patient reports coughing, phlegm, and sinus congestion for a few weeks. No fever or muscle pain. Family history of recent strep throat. -Prescribe Flonase and Zyrtec. -Advise patient to increase fluid intake, use a humidifier, and consider Mucinex for mucus relief.  Vaginal Discharge: Patient reports a few weeks of creamy, white vaginal discharge with a strong odor. No urinary symptoms or pelvic pain. History of bacterial vaginosis. -Collect vaginal swab for testing. -Prescribe appropriate treatment based on test results. -Advise patient on hygiene practices to prevent recurrence.  General Health Maintenance: -Continue current medications and treatments. -Follow-up with primary care provider and specialists as needed.       11/17/2022   11:30 AM 05/17/2022    1:50 PM 01/25/2016    1:37 PM   Depression screen PHQ 2/9  Decreased Interest 1 0 2  Down, Depressed, Hopeless 3 0 2  PHQ - 2 Score 4 0 4  Altered sleeping 3 0 3  Tired, decreased energy 2 2 3   Change in appetite 3 0 2  Feeling bad or failure about yourself  2 2 2   Trouble concentrating 1 1 1   Moving slowly or fidgety/restless 1 1 1   Suicidal thoughts 0 0 0  PHQ-9 Score 16 6 16   Difficult doing work/chores Somewhat difficult Not difficult at all       01/25/2016    1:38 PM  GAD 7 : Generalized Anxiety Score  Nervous, Anxious, on Edge 2  Control/stop worrying 2  Worry too much - different things 3  Trouble relaxing 2  Restless 1  Easily annoyed or irritable 3  Afraid - awful might happen 2  Total GAD 7 Score 15    Medications: Outpatient Medications Prior to Visit  Medication Sig   [DISCONTINUED] methocarbamol (ROBAXIN) 500 MG tablet Take 500 mg by mouth 4 (four) times daily as needed. (Patient not taking: Reported on 08/29/2022)   No facility-administered medications prior to visit.    Review of Systems Except see HPI   {Insert previous labs (optional):23779}  {See past labs  Heme  Chem  Endocrine  Serology  Results Review (optional):1}   Objective    BP 105/75 (BP Location: Left Arm, Patient Position:  Sitting, Cuff Size: Large)   Pulse 76   Temp 98.1 F (36.7 C) (Temporal)   Resp 16   Ht 5\' 5"  (1.651 m)   Wt 225 lb 4.8 oz (102.2 kg)   SpO2 97%   BMI 37.49 kg/m  {Insert last BP/Wt (optional):23777}  {See vitals history (optional):1}  Physical Exam   Results for orders placed or performed in visit on 11/17/22  POCT urinalysis dipstick  Result Value Ref Range   Color, UA yellow    Clarity, UA clear    Glucose, UA Negative Negative   Bilirubin, UA Negative    Ketones, UA Negative    Spec Grav, UA 1.020 1.010 - 1.025   Blood, UA Negative    pH, UA 6.0 5.0 - 8.0   Protein, UA Negative Negative   Urobilinogen, UA 0.2 0.2 or 1.0 E.U./dL   Nitrite, UA Negative    Leukocytes, UA  Negative Negative   Appearance     Odor      Assessment & Plan    *** Assessment and Plan          Upper Respiratory Symptoms: Patient reports coughing, phlegm, and sinus congestion for a few weeks. No fever or muscle pain. Family history of recent strep throat. -Prescribe Flonase and Zyrtec. -Advise patient to increase fluid intake, use a humidifier, and consider Mucinex for mucus relief.  Vaginal Discharge: Patient reports a few weeks of creamy, white vaginal discharge with a strong odor. No urinary symptoms or pelvic pain. History of bacterial vaginosis. -Collect vaginal swab for testing. -Prescribe appropriate treatment based on test results. -Advise patient on hygiene practices to prevent recurrence.  General Health Maintenance: -Continue current medications and treatments. -Follow-up with primary care provider and specialists as needed.    No follow-ups on file.      [facility]

## 2022-11-20 LAB — CERVICOVAGINAL ANCILLARY ONLY
Bacterial Vaginitis (gardnerella): POSITIVE — AB
Candida Glabrata: NEGATIVE
Candida Vaginitis: NEGATIVE
Chlamydia: NEGATIVE
Comment: NEGATIVE
Comment: NEGATIVE
Comment: NEGATIVE
Comment: NEGATIVE
Comment: NEGATIVE
Comment: NORMAL
Neisseria Gonorrhea: NEGATIVE
Trichomonas: NEGATIVE

## 2022-11-21 ENCOUNTER — Other Ambulatory Visit: Payer: Self-pay | Admitting: Physician Assistant

## 2022-11-21 DIAGNOSIS — N898 Other specified noninflammatory disorders of vagina: Secondary | ICD-10-CM

## 2022-11-21 MED ORDER — METRONIDAZOLE 500 MG PO TABS
500.0000 mg | ORAL_TABLET | Freq: Two times a day (BID) | ORAL | 0 refills | Status: AC
Start: 2022-11-21 — End: 2022-11-28

## 2022-11-22 ENCOUNTER — Other Ambulatory Visit: Payer: Self-pay

## 2022-11-22 ENCOUNTER — Telehealth: Payer: Self-pay | Admitting: Obstetrics and Gynecology

## 2022-11-22 DIAGNOSIS — Z8742 Personal history of other diseases of the female genital tract: Secondary | ICD-10-CM

## 2022-11-22 NOTE — Telephone Encounter (Signed)
Called patient to let her know about the change in her appointments. Didn't get answer so left message for patient and let the patient know if she had any questions about the message call office back.Her appointment is the following  11-27-22 @ 1:45opm but need to arrive at 1:30pm with full bladder at [facility]. Then appointment with Dr.Cherry is on 12-01-2022@ 11:15pm.

## 2022-11-23 ENCOUNTER — Encounter: Payer: MEDICAID | Admitting: Obstetrics and Gynecology

## 2022-11-23 NOTE — Telephone Encounter (Signed)
I contacted the patient via phone. I left generic message asking the patient to contact our office to confirm future scheduled appointments

## 2022-11-27 ENCOUNTER — Ambulatory Visit: Payer: MEDICAID

## 2022-11-27 ENCOUNTER — Ambulatory Visit
Admission: RE | Admit: 2022-11-27 | Discharge: 2022-11-27 | Disposition: A | Discharge status: Home / Self Care | Payer: MEDICAID | Source: Ambulatory Visit | Attending: Obstetrics and Gynecology | Admitting: Obstetrics and Gynecology

## 2022-11-27 DIAGNOSIS — Z8742 Personal history of other diseases of the female genital tract: Secondary | ICD-10-CM | POA: Diagnosis present
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
## 2022-12-01 ENCOUNTER — Encounter: Payer: MEDICAID | Admitting: Obstetrics and Gynecology

## 2022-12-10 ENCOUNTER — Encounter: Payer: Self-pay | Admitting: Physician Assistant

## 2022-12-10 DIAGNOSIS — N760 Acute vaginitis: Secondary | ICD-10-CM

## 2022-12-14 ENCOUNTER — Telehealth: Payer: MEDICAID | Admitting: Physician Assistant

## 2022-12-14 DIAGNOSIS — R3989 Other symptoms and signs involving the genitourinary system: Secondary | ICD-10-CM | POA: Diagnosis not present

## 2022-12-14 MED ORDER — NITROFURANTOIN MONOHYD MACRO 100 MG PO CAPS: 100.0000 mg | ORAL_CAPSULE | Freq: Two times a day (BID) | ORAL | 0 refills | Status: DC

## 2022-12-14 NOTE — Progress Notes (Signed)

## 2022-12-15 MED ORDER — METRONIDAZOLE 500 MG PO TABS
500.0000 mg | ORAL_TABLET | Freq: Two times a day (BID) | ORAL | 0 refills | Status: AC
Start: 2022-12-15 — End: 2022-12-22

## 2023-01-18 ENCOUNTER — Ambulatory Visit: Payer: MEDICAID | Admitting: Physician Assistant

## 2023-01-19 ENCOUNTER — Ambulatory Visit: Payer: MEDICAID | Admitting: Physician Assistant

## 2023-02-01 ENCOUNTER — Ambulatory Visit: Payer: MEDICAID

## 2023-02-08 ENCOUNTER — Ambulatory Visit (INDEPENDENT_AMBULATORY_CARE_PROVIDER_SITE_OTHER): Payer: MEDICAID | Admitting: Physician Assistant

## 2023-02-08 DIAGNOSIS — Z91199 Patient's noncompliance with other medical treatment and regimen due to unspecified reason: Secondary | ICD-10-CM

## 2023-02-09 ENCOUNTER — Encounter: Payer: Self-pay | Admitting: Physician Assistant

## 2023-02-09 NOTE — Progress Notes (Signed)
Patient was not seen for appt d/t no call, no show, or late arrival >10 mins past appt time.    Debera Lat PA [facility] [address] [place], [place] [address] [phone] (phone) [phone] (fax) [facility]

## 2023-03-07 ENCOUNTER — Ambulatory Visit: Payer: MEDICAID

## 2023-05-24 ENCOUNTER — Ambulatory Visit: Payer: MEDICAID

## 2023-05-24 ENCOUNTER — Encounter: Payer: Self-pay | Admitting: Family Medicine

## 2023-05-24 DIAGNOSIS — Z113 Encounter for screening for infections with a predominantly sexual mode of transmission: Secondary | ICD-10-CM

## 2023-05-24 LAB — HM HIV SCREENING LAB: HM HIV Screening: NEGATIVE

## 2023-05-24 LAB — HM HEPATITIS C SCREENING LAB: HM Hepatitis Screen: NEGATIVE

## 2023-05-24 LAB — WET PREP FOR TRICH, YEAST, CLUE
Trichomonas Exam: NEGATIVE
Yeast Exam: NEGATIVE

## 2023-05-24 LAB — HEPATITIS B SURFACE ANTIGEN
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
## 2023-05-24 NOTE — Progress Notes (Signed)
Pt here for STI screening.  Wet mount results reviewed with patient.  No treatment needed at this time.  Condoms given.  Pt to call if any questions or concerns.-Bisma Klett, RN

## 2023-07-05 ENCOUNTER — Ambulatory Visit: Payer: MEDICAID | Admitting: Physician Assistant

## 2023-07-05 ENCOUNTER — Encounter: Payer: Self-pay | Admitting: Physician Assistant

## 2023-07-05 VITALS — BP 108/72 | HR 63 | Temp 98.8°F | Ht 66.0 in | Wt 234.0 lb

## 2023-07-05 DIAGNOSIS — Z01419 Encounter for gynecological examination (general) (routine) without abnormal findings: Secondary | ICD-10-CM

## 2023-07-05 DIAGNOSIS — T7421XS Adult sexual abuse, confirmed, sequela: Secondary | ICD-10-CM

## 2023-07-05 DIAGNOSIS — N9489 Other specified conditions associated with female genital organs and menstrual cycle: Secondary | ICD-10-CM

## 2023-07-05 DIAGNOSIS — R21 Rash and other nonspecific skin eruption: Secondary | ICD-10-CM

## 2023-07-05 DIAGNOSIS — F1920 Other psychoactive substance dependence, uncomplicated: Secondary | ICD-10-CM

## 2023-07-05 DIAGNOSIS — F419 Anxiety disorder, unspecified: Secondary | ICD-10-CM | POA: Diagnosis not present

## 2023-07-05 DIAGNOSIS — Z0000 Encounter for general adult medical examination without abnormal findings: Secondary | ICD-10-CM

## 2023-07-05 DIAGNOSIS — Z0001 Encounter for general adult medical examination with abnormal findings: Secondary | ICD-10-CM | POA: Diagnosis not present

## 2023-07-05 DIAGNOSIS — F319 Bipolar disorder, unspecified: Secondary | ICD-10-CM

## 2023-07-05 DIAGNOSIS — L989 Disorder of the skin and subcutaneous tissue, unspecified: Secondary | ICD-10-CM
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
## 2023-07-06 ENCOUNTER — Encounter: Payer: Self-pay | Admitting: Physician Assistant

## 2023-07-09 ENCOUNTER — Encounter: Payer: Self-pay | Admitting: Certified Nurse Midwife

## 2023-07-09 ENCOUNTER — Ambulatory Visit (INDEPENDENT_AMBULATORY_CARE_PROVIDER_SITE_OTHER): Payer: MEDICAID | Admitting: Certified Nurse Midwife

## 2023-07-09 ENCOUNTER — Other Ambulatory Visit (HOSPITAL_COMMUNITY)
Admission: RE | Admit: 2023-07-09 | Discharge: 2023-07-09 | Disposition: A | Discharge status: Home / Self Care | Payer: MEDICAID | Source: Ambulatory Visit | Attending: Certified Nurse Midwife | Admitting: Certified Nurse Midwife

## 2023-07-09 VITALS — BP 92/60 | HR 78 | Ht 66.0 in | Wt 232.4 lb

## 2023-07-09 DIAGNOSIS — Z01419 Encounter for gynecological examination (general) (routine) without abnormal findings: Secondary | ICD-10-CM

## 2023-07-09 DIAGNOSIS — Z113 Encounter for screening for infections with a predominantly sexual mode of transmission: Secondary | ICD-10-CM

## 2023-07-09 DIAGNOSIS — Z124 Encounter for screening for malignant neoplasm of cervix: Secondary | ICD-10-CM | POA: Diagnosis present

## 2023-07-09 DIAGNOSIS — Z8742 Personal history of other diseases of the female genital tract: Secondary | ICD-10-CM

## 2023-07-09 NOTE — Progress Notes (Signed)
 GYNECOLOGY ANNUAL PREVENTATIVE CARE ENCOUNTER NOTE  History:     Jennifer Cortez is a 38 y.o. [id number] female here for a routine annual gynecologic exam.  Current complaints: history of right ovarian cyst, she continues to have tenderness on that side .   Denies abnormal vaginal bleeding, discharge, pelvic pain, problems with intercourse or other gynecologic concerns.     Social Relationship: single Living: with children  Work: Exercise: none  Smoke/Alcohol/drug use: smokes socially, alcohol occasional, Drugs Marijuana use and cocaine 1x wk.   Gynecologic History Patient's last menstrual period was 06/21/2023 (exact date). Contraception: tubal ligation Last Pap: 12/12/2016. Results were: normal with negative HPV Last mammogram: n/a.   Obstetric History OB History  Gravida Para Term Preterm AB Living  10 6 3 3 4 6   SAB IAB Ectopic Multiple Live Births  1 3 0 0 6    # Outcome Date GA Lbr Len/2nd Weight Sex Type Anes PTL Lv  10 Preterm 02/20/17 [redacted]w[redacted]d  4 lb 9 oz (2.07 kg) F CS-LTranv Spinal  LIV  9 Preterm 11/08/15 [redacted]w[redacted]d 03:24 / 00:22 6 lb 12.6 oz (3.08 kg) M Vag-Spont None  LIV  8 IAB 10/2014          7 Term 05/01/14 [redacted]w[redacted]d 05:12 / 01:15 6 lb 7 oz (2.92 kg) F Vag-Spont EPI  LIV     Birth Comments: Failed newborn hearing screen but passed at recheck 05/20/14  6 SAB 03/02/11          5 IAB 01/2010          4 Term 08/05/07 [redacted]w[redacted]d   M Vag-Spont   LIV  3 IAB 05/2004          2 Term 04/26/03 [redacted]w[redacted]d   F Vag-Spont   LIV  1 Preterm 09/08/01 [redacted]w[redacted]d   F Vag-Spont   LIV    Past Medical History:  Diagnosis Date   Allergy    Anxiety    Asthma    Bipolar 1 disorder (HCC)    Depression    Dermoid cyst of right ovary     5 cm dermoid in right ovary -> plan laparoscopic removal after delivery.   Gall stones    HSV (herpes simplex virus) infection    Substance abuse [facility])     Past Surgical History:  Procedure Laterality Date   CESAREAN SECTION N/A 02/20/2017   Procedure:  CESAREAN SECTION;  Surgeon: Levie Heritage, DO;  Location: [facility];  Service: Obstetrics;  Laterality: N/A;   DILATION AND CURETTAGE OF UTERUS     gall stones     MULTIPLE TOOTH EXTRACTIONS     TUBAL LIGATION      No current outpatient medications on file prior to visit.   No current facility-administered medications on file prior to visit.    No Known Allergies  Social History:  reports that she has been smoking cigarettes, e-cigarettes, and cigars. She has never used smokeless tobacco. She reports current alcohol use of about 5.0 standard drinks of alcohol per week. She reports current drug use. Drug: Marijuana.  Family History  Problem Relation Age of Onset   Hypertension Mother    Diabetes Maternal Grandmother    Hepatitis B Maternal Grandmother    Prostate cancer Maternal Grandfather     The following portions of the patient's history were reviewed and updated as appropriate: allergies, current medications, past family history, past medical history, past social history, past surgical history  and problem list.   Narrative & Impression  CLINICAL DATA:  History of ovarian cyst. Right lower quadrant pain for 1 year. The patient's last menstrual period was 11/08/2022.   EXAM: TRANSABDOMINAL AND TRANSVAGINAL ULTRASOUND OF PELVIS   Right ovary   Measurements: 2.9 x 2.3 x 2.2 cm = volume: 8 mL. A diffusely hyperechoic right adnexal mass measures 7.1 x 4.8 x 5.9 cm, increased from 09/16/2013 when it measured 5.0 x 3.4 x 5.0 cm. There is mild posterior acoustic shadowing.  Review of Systems Pertinent items noted in HPI and remainder of comprehensive ROS otherwise negative.  Physical Exam:  BP 92/60   Pulse 78   Ht 5\' 6"  (1.676 m)   Wt 232 lb 6.4 oz (105.4 kg)   LMP 06/21/2023 (Exact Date)   BMI 37.51 kg/m  CONSTITUTIONAL: Well-developed, well-nourished female in no acute distress.  HENT:  Normocephalic, atraumatic, External right and left ear normal.  Oropharynx is clear and moist EYES: Conjunctivae and EOM are normal. Pupils are equal, round, and reactive to light. No scleral icterus.  NECK: Normal range of motion, supple, no masses.  Normal thyroid.  SKIN: Skin is warm and dry. No rash noted. Not diaphoretic. No erythema. No pallor. MUSCULOSKELETAL: Normal range of motion. No tenderness.  No cyanosis, clubbing, or edema.  2+ distal pulses. NEUROLOGIC: Alert and oriented to person, place, and time. Normal reflexes, muscle tone coordination.  PSYCHIATRIC: Normal mood and affect. Normal behavior. Normal judgment and thought content. CARDIOVASCULAR: Normal heart rate noted, regular rhythm RESPIRATORY: Clear to auscultation bilaterally. Effort and breath sounds normal, no problems with respiration noted. BREASTS: Symmetric in size. No masses, tenderness, skin changes, nipple drainage, or lymphadenopathy bilaterally.  ABDOMEN: Soft, no distention noted.  No tenderness, rebound or guarding.  PELVIC: Normal appearing external genitalia and urethral meatus; normal appearing vaginal mucosa and cervix.  No abnormal discharge noted.  Pap smear obtained. Contact bleeding with pap .  Normal uterine size, no other palpable masses, no uterine  tenderness . Positive for right  adnexal tenderness.  .   Assessment and Plan:    1. Women's annual routine gynecological examination (Primary)  Pap: Will follow up results of pap smear and manage accordingly Mammogram : n/a  Labs: vaginal swab, u/s ordered f/u right adnexal mass.  Refills: none Referral:none Routine preventative health maintenance measures emphasized. Please refer to After Visit Summary for other counseling recommendations.      Doreene Burke, CNM [facility]  [facility],  [facility]

## 2023-07-09 NOTE — Patient Instructions (Signed)

## 2023-07-11 LAB — CERVICOVAGINAL ANCILLARY ONLY
Bacterial Vaginitis (gardnerella): POSITIVE — AB
Candida Glabrata: NEGATIVE
Candida Vaginitis: NEGATIVE
Chlamydia: NEGATIVE
Comment: NEGATIVE
Comment: NEGATIVE
Comment: NEGATIVE
Comment: NEGATIVE
Comment: NEGATIVE
Comment: NORMAL
Neisseria Gonorrhea: NEGATIVE
Trichomonas: POSITIVE — AB

## 2023-07-11 LAB — CYTOLOGY - PAP
Comment: NEGATIVE
Diagnosis: NEGATIVE
High risk HPV: NEGATIVE

## 2023-07-13 ENCOUNTER — Encounter: Payer: Self-pay | Admitting: Certified Nurse Midwife

## 2023-07-13 ENCOUNTER — Other Ambulatory Visit: Payer: Self-pay | Admitting: Certified Nurse Midwife

## 2023-07-13 MED ORDER — METRONIDAZOLE 500 MG PO TABS: 500.0000 mg | ORAL_TABLET | Freq: Two times a day (BID) | ORAL | 0 refills | Status: AC

## 2023-07-27 ENCOUNTER — Other Ambulatory Visit: Payer: MEDICAID

## 2023-08-28 ENCOUNTER — Ambulatory Visit: Payer: MEDICAID

## 2023-08-28 DIAGNOSIS — Z8742 Personal history of other diseases of the female genital tract: Secondary | ICD-10-CM | POA: Diagnosis not present

## 2023-08-28 DIAGNOSIS — Z01419 Encounter for gynecological examination (general) (routine) without abnormal findings: Secondary | ICD-10-CM

## 2023-08-29 ENCOUNTER — Ambulatory Visit: Payer: MEDICAID | Admitting: Nurse Practitioner

## 2023-08-29 DIAGNOSIS — Z113 Encounter for screening for infections with a predominantly sexual mode of transmission: Secondary | ICD-10-CM

## 2023-08-29 LAB — WET PREP FOR TRICH, YEAST, CLUE
Trichomonas Exam: NEGATIVE
Yeast Exam: NEGATIVE

## 2023-08-29 NOTE — Progress Notes (Signed)
 Pt is here for std screening, wet prep results reviewed with pt no treatment required per standing order. Condoms given. Caren Channel, RN

## 2023-09-02 ENCOUNTER — Encounter: Payer: Self-pay | Admitting: Nurse Practitioner
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
## 2023-09-03 ENCOUNTER — Encounter: Payer: Self-pay | Admitting: Family Medicine

## 2023-09-03 DIAGNOSIS — Z91410 Personal history of adult physical and sexual abuse: Secondary | ICD-10-CM | POA: Insufficient documentation

## 2023-09-03 LAB — GONOCOCCUS CULTURE

## 2023-09-11 ENCOUNTER — Ambulatory Visit: Payer: Self-pay | Admitting: Certified Nurse Midwife

## 2023-09-17 NOTE — Telephone Encounter (Signed)
 Consulted Dr. Everardo Hitch, she recommends pt see Dr. Luster Salters to discuss removal of cyst / surgery for endometrial tissue in scar tissue.   Pt notified via my chart message to call to schedule with Dr. Luster Salters to discuss further.   Alise Appl, CNM

## 2023-09-20 ENCOUNTER — Telehealth: Payer: MEDICAID | Admitting: Nurse Practitioner

## 2023-09-20 DIAGNOSIS — A601 Herpesviral infection of perianal skin and rectum: Secondary | ICD-10-CM

## 2023-09-20 MED ORDER — ACYCLOVIR 400 MG PO TABS: 400.0000 mg | ORAL_TABLET | Freq: Three times a day (TID) | ORAL | 0 refills | Status: AC

## 2023-09-20 NOTE — Progress Notes (Signed)
 I am treating you for Genital herpes today> please follow up with your PCP for an in office visit of the other lesion you have a concern about.  E-Visit for Herpes Simplex  We are sorry that you are not feeling well.  Here is how we plan to help!  Based on what you have shared ith me, it looks like you may be having an outbreak/flare-up of genital herpes.    I have prescribed I have prescribed Acyclovir  400 mg Take on by mouth three times daily for 5 days.    If you have been prescribed long term medications to be taken on a regular basis, it is important to follow the recommendations and take them as ordered.    Outbreaks usually include blisters and open sores in the genital area. Outbreaks that happen after the first time are usually not as severe and do not last as long. Genital Herpes Simplex is a commonly sexually transmitted viral infection that is found worldwide. Most of these genital infections are caused by one or two herpes simplex viruses that is passed from person to person during vaginal, oral, or anal sex. Sometimes, people do not know they have herpes because they do not have any symptoms.  Please be aware that if you have genital herpes you can be contagious even when you are not having rash or flare-up and you may not have any symptoms, even when you are taking suppressive medicines.  Herpes cannot be cured. The disease usually causes most problems during the first few years. After that, the virus is still there, but it causes few to no symptoms. Even when the virus is active, people with herpes can take medicines to reduce and help prevent symptoms.  Herpes is an infection that can cause blisters and open sores on the genital area. Herpes is caused by a virus that is passed from person to person during vaginal, oral, or anal sex. Sometimes, people do not know they have herpes because they do not have any symptoms. Herpes cannot be cured. The disease usually causes most problems  during the first few years. After that, the virus is still there, but it causes few to no symptoms. Even when the virus is active, people with herpes can take medicines to reduce and help prevent symptoms.  If you have been prescribed medications to be taken on a regular basis, it is important to follow the recommendations and take them as ordered.  Some people with herpes never have any symptoms. But other people can develop symptoms within a few weeks of being infected with the herpes virus   Symptoms usually include blisters in the genital area. In women, this area includes the vagina, buttocks, anus, or thighs. In men, this area includes the penis, scrotum, anus, butt, or thighs. The blisters can become painful open sores, which then crust over as they heal. Sometimes, people can have other symptoms that include:  ?Blisters on the mouth or lips ?Fever, headache, or pain in the joints ?Trouble urinating  Outbreaks might occur every month or more often, or just once or twice a year. Sometimes, people can tell when an outbreak will occur, because they feel itching or pain beforehand. Sometimes they do not know that an outbreak is coming because they have no symptoms. Whatever your pattern is, keep in mind that herpes outbreaks usually become less frequent over time as you get older. Certain things, called "triggers," can make outbreaks more likely to occur. These include stress, sunlight,  menstrual periods,or getting sick.  Antiviral therapy can shorten the duration of symptoms and signs in primary infection, which, when untreated, can be associated with significant increase in the symptoms of the disease.  HOME CARE Use a portable bath (such as a "Sitz bath") where you can sit in warm water for about 20 minutes. Your bathtub could also work. Avoid bubble baths.  Keep the genital area clean and dry and avoid tight clothes.  Take over-the-counter pain medicine such as acetaminophen  (brand name:  Tylenol ) or ibuprofen  sample brand names: Advil , Motrin ). But avoid aspirin.  Only take medications as instructed by your medical team.  You are most likely to spread herpes to a sex partner when you have blisters and open sores on your body. But it's also possible to spread herpes to your partner when you do not have any symptoms. That is because herpes can be present on your body without causing any symptoms, like blisters or pain.  Telling your sex partner that you have herpes can be hard. But it can help protect them, since there are ways to lower the risk of spreading the infection.   Using a condom every time you have sex  Not having sex when you have symptoms  Not having oral sex if you have blisters or open sores (in the genital area or around your mouth)  MAKE SURE YOU   Understand these instructions. Do not have sex without using a condom until you have been seen by a doctor and as instructed by the provider If you are not better or improved within 7 days, you MUST have a follow up at your doctor or the health department for evaluation. There are other causes of rashes in the genital region.  Thank you for choosing an e-visit.  Your e-visit answers were reviewed by a board certified advanced clinical practitioner to complete your personal care plan. Depending upon the condition, your plan could have included both over the counter or prescription medications.  Please review your pharmacy choice. Make sure the pharmacy is open so you can pick up prescription now. If there is a problem, you may contact your provider through [employer] and have the prescription routed to another pharmacy.  Your safety is important to us . If you have drug allergies check your prescription carefully.   For the next 24 hours you can use MyChart to ask questions about today's visit, request a non-urgent call back, or ask for a work or school excuse. You will get an email in the next two days  asking about your experience. I hope that your e-visit has been valuable and will speed your recovery.

## 2023-09-20 NOTE — Progress Notes (Signed)
 I have spent 5 minutes in review of e-visit questionnaire, review and updating patient chart, medical decision making and response to patient.   Claiborne Rigg, NP

## 2023-10-15 ENCOUNTER — Other Ambulatory Visit: Payer: Self-pay | Admitting: Certified Nurse Midwife

## 2023-10-15 DIAGNOSIS — N838 Other noninflammatory disorders of ovary, fallopian tube and broad ligament: Secondary | ICD-10-CM

## 2023-10-15 DIAGNOSIS — N840 Polyp of corpus uteri: Secondary | ICD-10-CM

## 2023-10-15 NOTE — Progress Notes (Signed)
 Orders placed for MRI, pt notified of recommendations for further imaging.   Alise Appl, CNM

## 2023-10-16 NOTE — Progress Notes (Signed)
 Order changed to MRI instead of MRV per insurance Per message from Kerby Pearson. Hey, After submitting the authorization I spoke with the clinical review team with the patients insurance due to them asking for more clinical documentation. I have sent all documents including provider notes and previous ultrasounds. They will not cover the MRA. They stated they will however cover a MRI-Cpt code 16109. Are we able to change this order to an MRI with or without contrast as this is what the insurance and Dr. Luster Salters is recommending. Please advise?

## 2023-10-16 NOTE — Addendum Note (Signed)
 Addended by: Arcelia Bean on: 10/16/2023 10:08 AM   Modules accepted: Orders

## 2023-10-24 ENCOUNTER — Ambulatory Visit: Admission: RE | Admit: 2023-10-24 | Payer: MEDICAID | Source: Ambulatory Visit

## 2023-10-28 ENCOUNTER — Ambulatory Visit: Admission: RE | Admit: 2023-10-28 | Payer: MEDICAID | Source: Ambulatory Visit

## 2023-11-01 ENCOUNTER — Ambulatory Visit: Admission: RE | Admit: 2023-11-01 | Payer: MEDICAID | Source: Ambulatory Visit

## 2023-11-08 ENCOUNTER — Ambulatory Visit
Admission: RE | Admit: 2023-11-08 | Discharge: 2023-11-08 | Disposition: A | Discharge status: Home / Self Care | Payer: MEDICAID | Source: Ambulatory Visit | Attending: Certified Nurse Midwife | Admitting: Certified Nurse Midwife

## 2023-11-08 DIAGNOSIS — N838 Other noninflammatory disorders of ovary, fallopian tube and broad ligament: Secondary | ICD-10-CM | POA: Diagnosis present

## 2023-11-08 DIAGNOSIS — N840 Polyp of corpus uteri: Secondary | ICD-10-CM | POA: Diagnosis present

## 2023-11-08 MED ORDER — GADOBUTROL 1 MMOL/ML IV SOLN
10.0000 mL | Freq: Once | INTRAVENOUS | Status: AC | PRN
  Administered 2023-11-08: 10 mL via INTRAVENOUS

## 2023-11-09 ENCOUNTER — Ambulatory Visit: Payer: MEDICAID

## 2023-11-10 ENCOUNTER — Ambulatory Visit: Payer: Self-pay | Admitting: Certified Nurse Midwife

## 2023-11-28 ENCOUNTER — Encounter: Payer: Self-pay | Admitting: Obstetrics and Gynecology

## 2023-11-28 ENCOUNTER — Telehealth: Payer: MEDICAID | Admitting: Obstetrics and Gynecology

## 2023-11-28 DIAGNOSIS — N83291 Other ovarian cyst, right side: Secondary | ICD-10-CM | POA: Diagnosis not present

## 2023-11-28 DIAGNOSIS — N83299 Other ovarian cyst, unspecified side: Secondary | ICD-10-CM
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
## 2023-12-06 ENCOUNTER — Ambulatory Visit: Payer: MEDICAID

## 2023-12-13 ENCOUNTER — Ambulatory Visit: Payer: MEDICAID

## 2023-12-17 ENCOUNTER — Ambulatory Visit: Payer: MEDICAID

## 2023-12-17 DIAGNOSIS — B9689 Other specified bacterial agents as the cause of diseases classified elsewhere: Secondary | ICD-10-CM

## 2023-12-17 DIAGNOSIS — Z113 Encounter for screening for infections with a predominantly sexual mode of transmission: Secondary | ICD-10-CM

## 2023-12-17 LAB — WET PREP FOR TRICH, YEAST, CLUE
Clue Cell Exam: NEGATIVE
Trichomonas Exam: NEGATIVE
Yeast Exam: NEGATIVE

## 2023-12-17 MED ORDER — METRONIDAZOLE 500 MG PO TABS: 500.0000 mg | ORAL_TABLET | Freq: Two times a day (BID) | ORAL | Status: AC
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
## 2023-12-17 NOTE — Progress Notes (Signed)
 Pt here for STI screening.  Wet mount results reviewed with patient.  Per written order of E. Holmes NP, Metronidazole  500mg  #14 dispensed to patient.  Counseled re medication, side effects, plan of care and when to contact clinic with questions or concerns.  Verbalizes understanding.  Condoms given.-Anuja Manka, RN

## 2023-12-22 LAB — GONOCOCCUS CULTURE

## 2024-07-07 ENCOUNTER — Encounter: Payer: MEDICAID | Admitting: Physician Assistant
